# Patient Record
Sex: Female | Born: 1965 | Race: Black or African American | Hispanic: No | Marital: Single | Smoking: Former smoker
Health system: Southern US, Community
[De-identification: ages and names within clinical notes are randomized; demographics above are authoritative.]

## PROBLEM LIST (undated history)

## (undated) DIAGNOSIS — B999 Unspecified infectious disease: Secondary | ICD-10-CM

## (undated) DIAGNOSIS — L259 Unspecified contact dermatitis, unspecified cause: Secondary | ICD-10-CM

## (undated) DIAGNOSIS — J449 Chronic obstructive pulmonary disease, unspecified: Secondary | ICD-10-CM

## (undated) DIAGNOSIS — F603 Borderline personality disorder: Secondary | ICD-10-CM

## (undated) DIAGNOSIS — D219 Benign neoplasm of connective and other soft tissue, unspecified: Secondary | ICD-10-CM

## (undated) DIAGNOSIS — F419 Anxiety disorder, unspecified: Secondary | ICD-10-CM

## (undated) DIAGNOSIS — H8109 Meniere's disease, unspecified ear: Secondary | ICD-10-CM

## (undated) DIAGNOSIS — M5430 Sciatica, unspecified side: Secondary | ICD-10-CM

## (undated) HISTORY — DX: Sciatica, unspecified side: M54.30

## (undated) HISTORY — PX: HEMORRHOID BANDING: SHX5850

## (undated) HISTORY — DX: Anxiety disorder, unspecified: F41.9

## (undated) HISTORY — DX: Unspecified contact dermatitis, unspecified cause: L25.9

---

## 2002-02-24 ENCOUNTER — Other Ambulatory Visit: Admission: RE | Admit: 2002-02-24 | Discharge: 2002-02-24 | Payer: Self-pay

## 2002-04-28 ENCOUNTER — Emergency Department (HOSPITAL_COMMUNITY): Admission: EM | Admit: 2002-04-28 | Discharge: 2002-04-28 | Payer: Self-pay | Admitting: Emergency Medicine

## 2003-03-16 ENCOUNTER — Other Ambulatory Visit: Admission: RE | Admit: 2003-03-16 | Discharge: 2003-03-16 | Payer: Self-pay | Admitting: Obstetrics and Gynecology

## 2003-03-16 ENCOUNTER — Encounter: Admission: RE | Admit: 2003-03-16 | Discharge: 2003-03-16 | Payer: Self-pay | Admitting: Obstetrics and Gynecology

## 2003-03-17 ENCOUNTER — Ambulatory Visit (HOSPITAL_COMMUNITY): Admission: RE | Admit: 2003-03-17 | Discharge: 2003-03-17 | Payer: Self-pay | Admitting: Obstetrics and Gynecology

## 2003-04-14 ENCOUNTER — Emergency Department (HOSPITAL_COMMUNITY): Admission: AD | Admit: 2003-04-14 | Discharge: 2003-04-14 | Payer: Self-pay

## 2003-04-16 ENCOUNTER — Emergency Department (HOSPITAL_COMMUNITY): Admission: EM | Admit: 2003-04-16 | Discharge: 2003-04-16 | Payer: Self-pay | Admitting: Emergency Medicine

## 2003-06-01 ENCOUNTER — Encounter: Admission: RE | Admit: 2003-06-01 | Discharge: 2003-06-01 | Payer: Self-pay | Admitting: Internal Medicine

## 2003-10-04 ENCOUNTER — Encounter: Admission: RE | Admit: 2003-10-04 | Discharge: 2003-10-04 | Payer: Self-pay | Admitting: Family Medicine

## 2003-10-05 ENCOUNTER — Encounter: Admission: RE | Admit: 2003-10-05 | Discharge: 2003-10-05 | Payer: Self-pay | Admitting: Sports Medicine

## 2003-10-09 ENCOUNTER — Emergency Department (HOSPITAL_COMMUNITY): Admission: EM | Admit: 2003-10-09 | Discharge: 2003-10-09 | Payer: Self-pay

## 2003-10-28 ENCOUNTER — Encounter: Admission: RE | Admit: 2003-10-28 | Discharge: 2003-10-28 | Payer: Self-pay | Admitting: Family Medicine

## 2004-09-07 ENCOUNTER — Ambulatory Visit: Payer: Self-pay | Admitting: Obstetrics and Gynecology

## 2004-09-22 ENCOUNTER — Ambulatory Visit (HOSPITAL_COMMUNITY): Admission: RE | Admit: 2004-09-22 | Discharge: 2004-09-22 | Payer: Self-pay | Admitting: Family Medicine

## 2005-03-15 ENCOUNTER — Ambulatory Visit: Payer: Self-pay | Admitting: Family Medicine

## 2005-06-29 ENCOUNTER — Ambulatory Visit: Payer: Self-pay | Admitting: Family Medicine

## 2005-07-22 ENCOUNTER — Encounter (INDEPENDENT_AMBULATORY_CARE_PROVIDER_SITE_OTHER): Payer: Self-pay | Admitting: *Deleted

## 2005-07-22 LAB — CONVERTED CEMR LAB

## 2005-10-08 ENCOUNTER — Inpatient Hospital Stay (HOSPITAL_COMMUNITY): Admission: AD | Admit: 2005-10-08 | Discharge: 2005-10-08 | Payer: Self-pay | Admitting: Obstetrics and Gynecology

## 2005-10-22 HISTORY — PX: TOTAL ABDOMINAL HYSTERECTOMY: SHX209

## 2005-11-09 ENCOUNTER — Ambulatory Visit: Payer: Self-pay | Admitting: Family Medicine

## 2005-11-09 ENCOUNTER — Other Ambulatory Visit: Admission: RE | Admit: 2005-11-09 | Discharge: 2005-11-09 | Payer: Self-pay | Admitting: Family Medicine

## 2005-11-09 ENCOUNTER — Encounter (INDEPENDENT_AMBULATORY_CARE_PROVIDER_SITE_OTHER): Payer: Self-pay | Admitting: *Deleted

## 2005-11-09 ENCOUNTER — Encounter: Payer: Self-pay | Admitting: Family Medicine

## 2005-11-23 ENCOUNTER — Ambulatory Visit (HOSPITAL_COMMUNITY): Admission: RE | Admit: 2005-11-23 | Discharge: 2005-11-23 | Payer: Self-pay | Admitting: Family Medicine

## 2005-11-30 ENCOUNTER — Ambulatory Visit: Payer: Self-pay | Admitting: Family Medicine

## 2005-12-28 ENCOUNTER — Ambulatory Visit: Payer: Self-pay | Admitting: Obstetrics and Gynecology

## 2006-02-08 ENCOUNTER — Ambulatory Visit: Payer: Self-pay | Admitting: Gynecology

## 2006-04-03 ENCOUNTER — Ambulatory Visit: Payer: Self-pay | Admitting: Family Medicine

## 2006-04-03 ENCOUNTER — Inpatient Hospital Stay (HOSPITAL_COMMUNITY): Admission: RE | Admit: 2006-04-03 | Discharge: 2006-04-05 | Payer: Self-pay | Admitting: Family Medicine

## 2006-04-03 ENCOUNTER — Encounter (INDEPENDENT_AMBULATORY_CARE_PROVIDER_SITE_OTHER): Payer: Self-pay | Admitting: Specialist

## 2006-04-03 HISTORY — PX: OTHER SURGICAL HISTORY: SHX169

## 2006-04-18 ENCOUNTER — Ambulatory Visit: Payer: Self-pay | Admitting: Family Medicine

## 2006-04-27 ENCOUNTER — Ambulatory Visit: Payer: Self-pay | Admitting: Obstetrics & Gynecology

## 2006-04-27 ENCOUNTER — Inpatient Hospital Stay (HOSPITAL_COMMUNITY): Admission: AD | Admit: 2006-04-27 | Discharge: 2006-04-29 | Payer: Self-pay | Admitting: Obstetrics & Gynecology

## 2006-05-08 ENCOUNTER — Inpatient Hospital Stay (HOSPITAL_COMMUNITY): Admission: AD | Admit: 2006-05-08 | Discharge: 2006-05-08 | Payer: Self-pay | Admitting: Gynecology

## 2006-05-16 ENCOUNTER — Ambulatory Visit: Payer: Self-pay | Admitting: Family Medicine

## 2006-05-24 ENCOUNTER — Ambulatory Visit: Payer: Self-pay | Admitting: Gynecology

## 2006-11-21 ENCOUNTER — Encounter: Admission: RE | Admit: 2006-11-21 | Discharge: 2006-11-21 | Payer: Self-pay | Admitting: Sports Medicine

## 2006-11-21 ENCOUNTER — Ambulatory Visit: Payer: Self-pay | Admitting: Family Medicine

## 2006-12-17 ENCOUNTER — Ambulatory Visit: Payer: Self-pay | Admitting: Family Medicine

## 2006-12-20 ENCOUNTER — Encounter (INDEPENDENT_AMBULATORY_CARE_PROVIDER_SITE_OTHER): Payer: Self-pay | Admitting: *Deleted

## 2007-02-27 ENCOUNTER — Telehealth: Payer: Self-pay | Admitting: *Deleted

## 2007-02-28 ENCOUNTER — Ambulatory Visit: Payer: Self-pay | Admitting: Family Medicine

## 2007-03-27 ENCOUNTER — Encounter (INDEPENDENT_AMBULATORY_CARE_PROVIDER_SITE_OTHER): Payer: Self-pay | Admitting: *Deleted

## 2007-05-06 ENCOUNTER — Telehealth (INDEPENDENT_AMBULATORY_CARE_PROVIDER_SITE_OTHER): Payer: Self-pay | Admitting: *Deleted

## 2007-05-15 ENCOUNTER — Ambulatory Visit: Payer: Self-pay | Admitting: Family Medicine

## 2007-05-15 DIAGNOSIS — F1290 Cannabis use, unspecified, uncomplicated: Secondary | ICD-10-CM | POA: Insufficient documentation

## 2007-05-15 DIAGNOSIS — F1210 Cannabis abuse, uncomplicated: Secondary | ICD-10-CM | POA: Insufficient documentation

## 2007-05-15 DIAGNOSIS — J449 Chronic obstructive pulmonary disease, unspecified: Secondary | ICD-10-CM

## 2007-05-29 ENCOUNTER — Ambulatory Visit (HOSPITAL_COMMUNITY): Admission: RE | Admit: 2007-05-29 | Discharge: 2007-05-29 | Payer: Self-pay | Admitting: Sports Medicine

## 2007-06-04 ENCOUNTER — Telehealth (INDEPENDENT_AMBULATORY_CARE_PROVIDER_SITE_OTHER): Payer: Self-pay | Admitting: *Deleted

## 2007-06-11 ENCOUNTER — Telehealth (INDEPENDENT_AMBULATORY_CARE_PROVIDER_SITE_OTHER): Payer: Self-pay | Admitting: *Deleted

## 2007-06-20 ENCOUNTER — Ambulatory Visit: Payer: Self-pay | Admitting: Family Medicine

## 2007-06-27 ENCOUNTER — Ambulatory Visit (HOSPITAL_COMMUNITY): Admission: RE | Admit: 2007-06-27 | Discharge: 2007-06-27 | Payer: Self-pay | Admitting: Obstetrics & Gynecology

## 2007-06-29 IMAGING — CT CT PELVIS W/ CM
1 of 3 series · 14 of 32 positions shown, 19 images · IV contrast ([ID] GASTRO-MX & [ID] OMNIP 300%)
Comparison: none

CLINICAL DATA: 39-year-old with vaginal hysterectomy on [DATE].  Patient has abdominal pain.  Status post total vaginal hysterectomy with fever.  
ABDOMEN CT WITH CONTRAST:
TECHNIQUE: Multidetector CT imaging of the abdomen was performed following the standard protocol during bolus administration of intravenous contrast.
Contrast:  100 cc Omnipaque 300 and oral contrast.
TECHNIQUE: Multidetector CT imaging of the pelvis was performed following the standard protocol during bolus administration of intravenous contrast.

[Series 2: abd pelvis · axial · 0.61mm/px · z∈[-374,-54]mm · 14 of 74 slices shown, 19 images]
[im 5/74  soft-tissue]
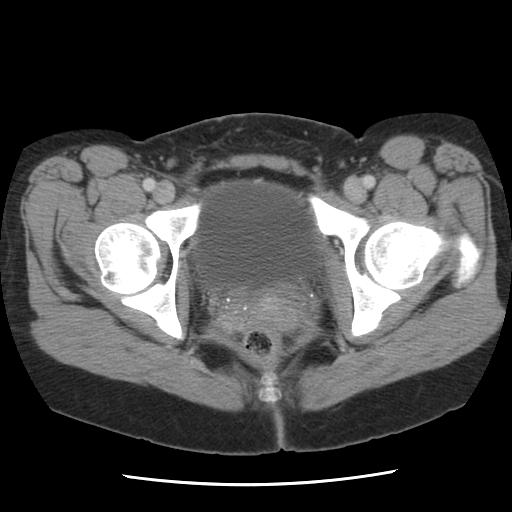
[im 5/74  bone]
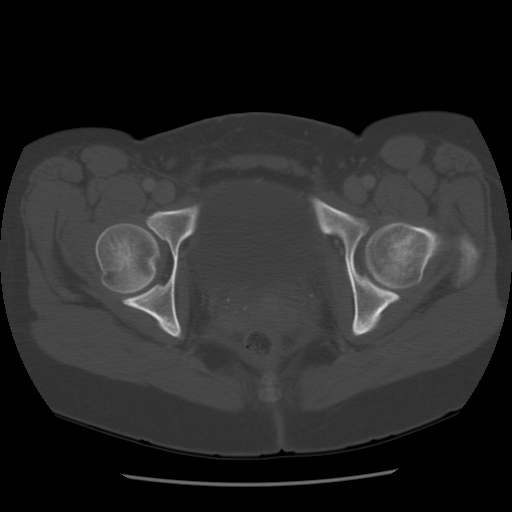
[im 10/74  soft-tissue]
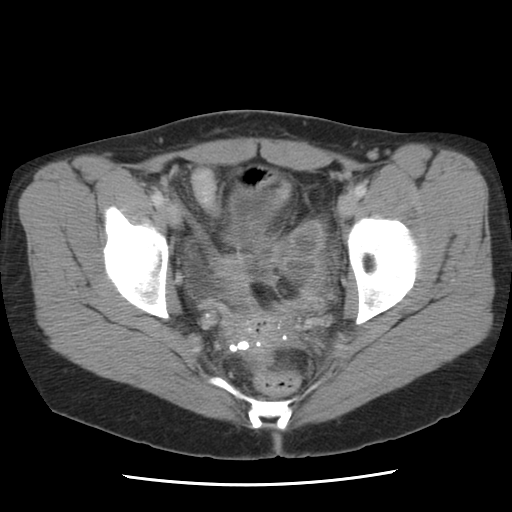
[im 14/74  soft-tissue]
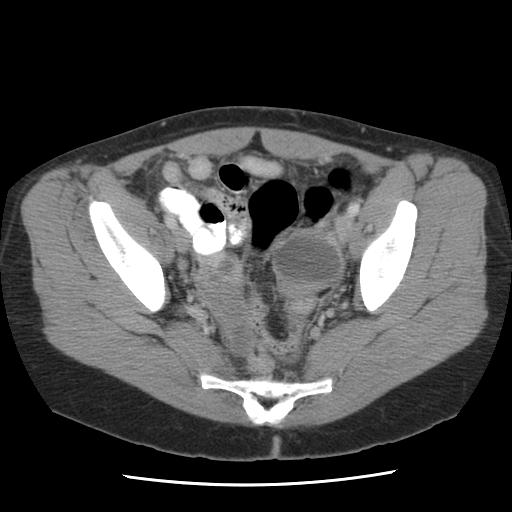
[im 23/74  soft-tissue]
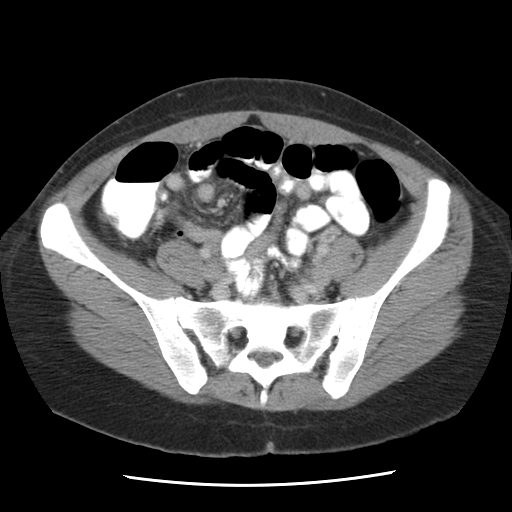
[im 28/74  soft-tissue]
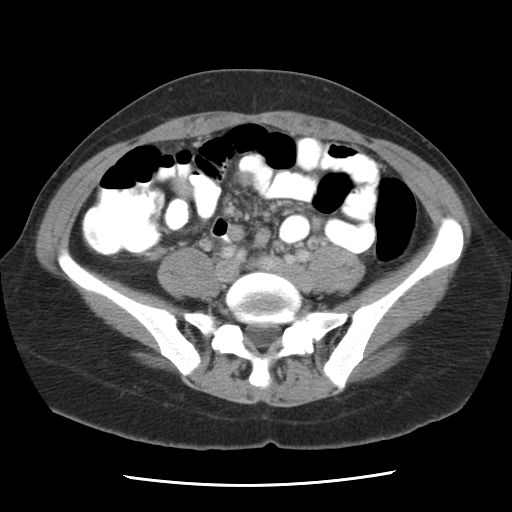
[im 32/74  soft-tissue]
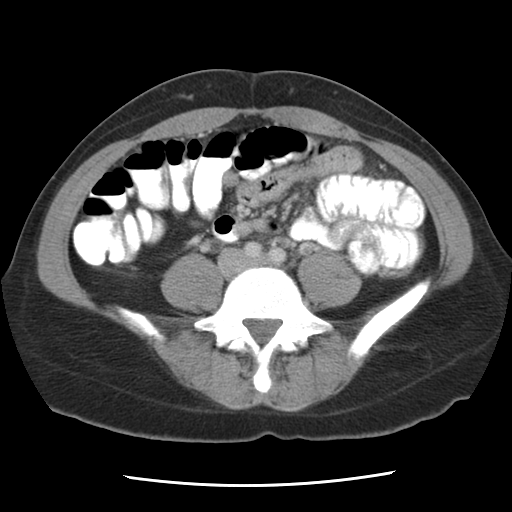
[im 37/74  soft-tissue]
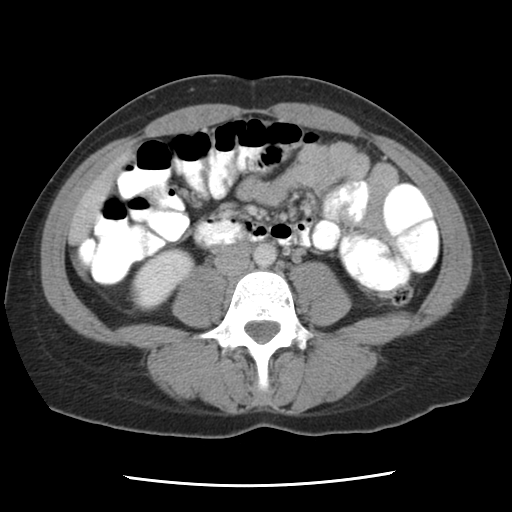
[im 42/74  soft-tissue]
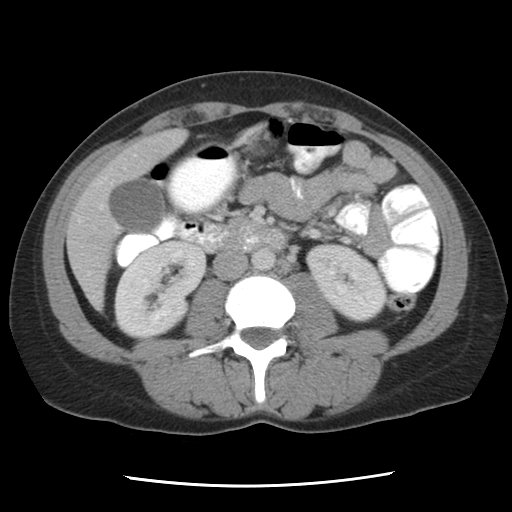
[im 46/74  soft-tissue]
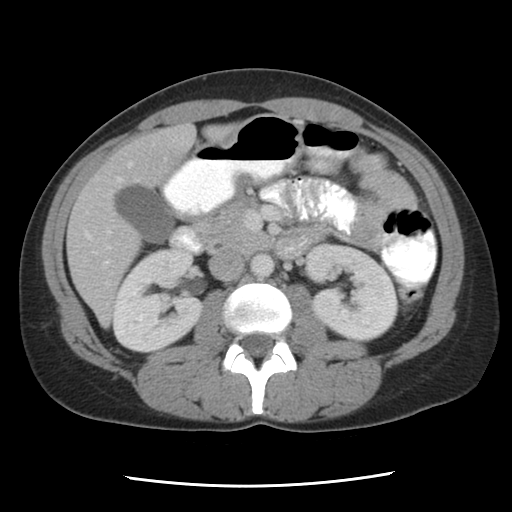
[im 46/74  bone]
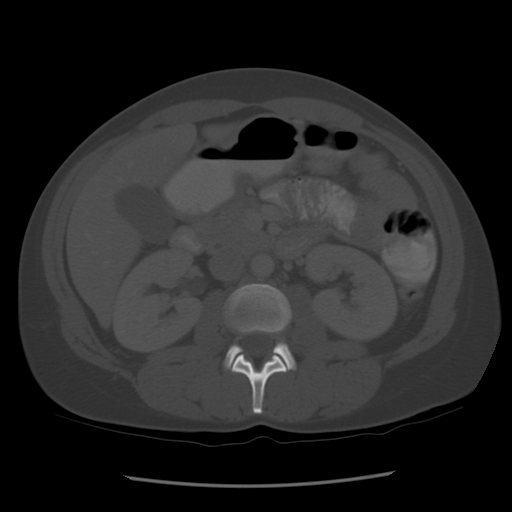
[im 51/74  soft-tissue]
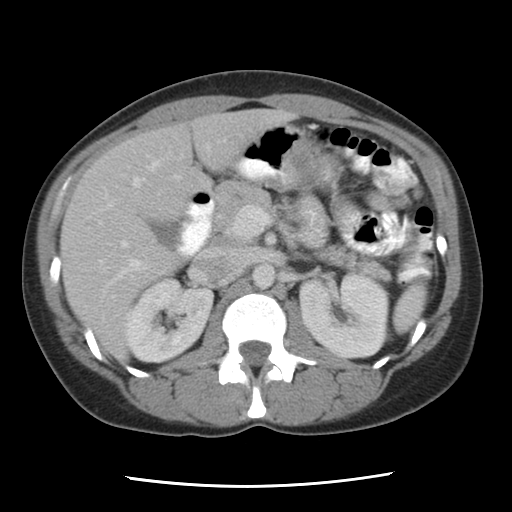
[im 55/74  lung]
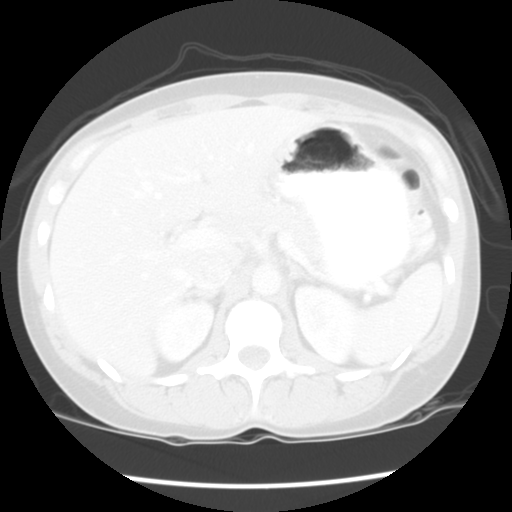
[im 60/74  soft-tissue]
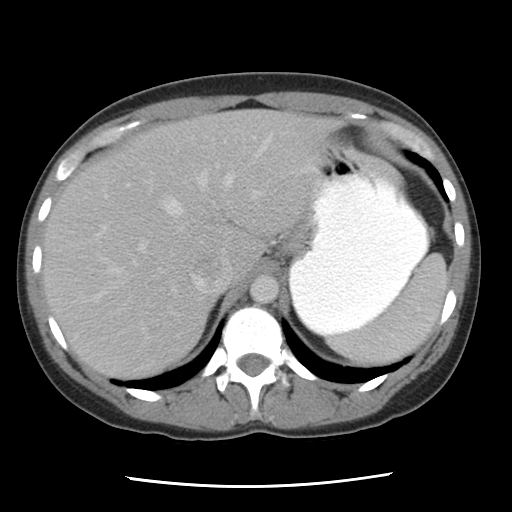
[im 60/74  lung]
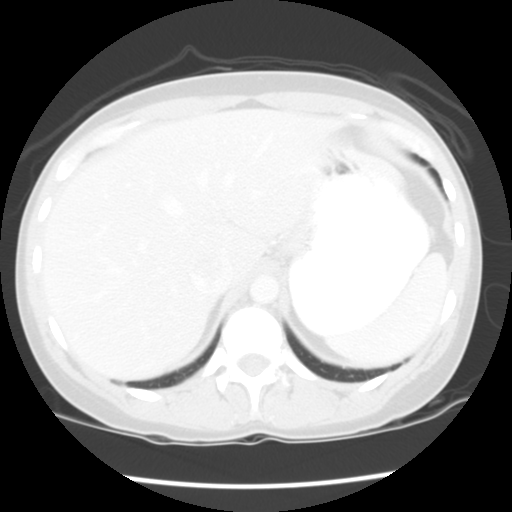
[im 64/74  soft-tissue]
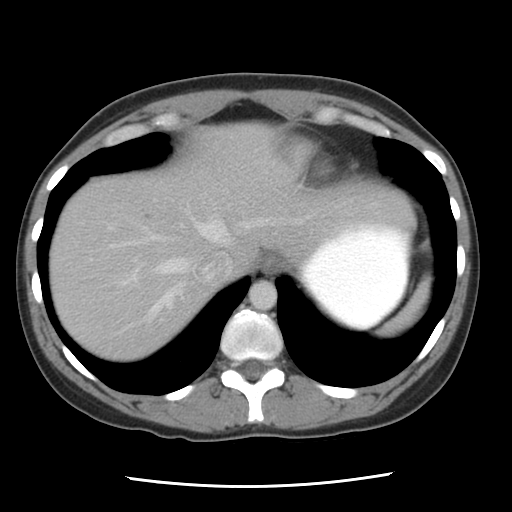
[im 64/74  lung]
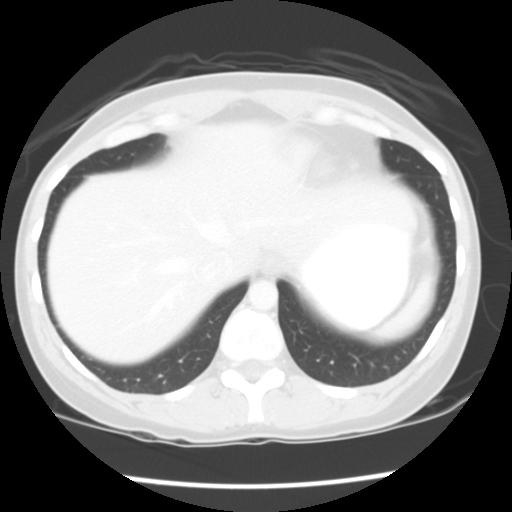
[im 69/74  soft-tissue]
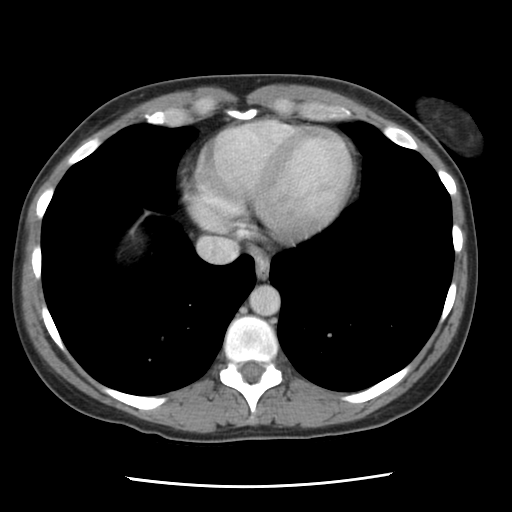
[im 69/74  lung]
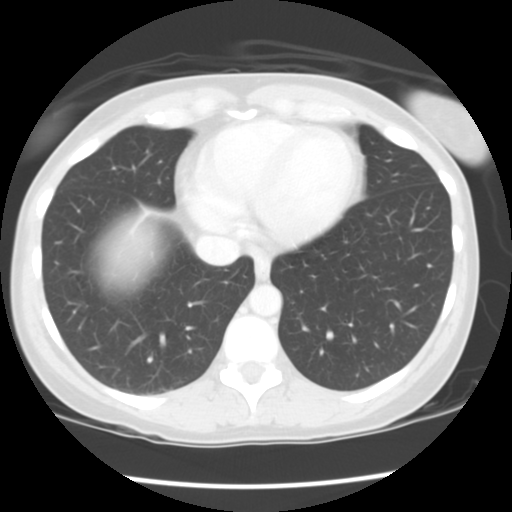

[14 of 32 positions shown; findings below may reference images not displayed]

FINDINGS: Images of the lung bases are unremarkable.  No focal abnormality is seen within the liver, spleen, pancreas, adrenal glands, or kidneys.  The gallbladder is present.  The abdominal bowel loops have a normal appearance.
IMPRESSION: No evidence for acute abnormality of the abdomen. 
PELVIS CT WITH CONTRAST:
FINDINGS: Patient has had hysterectomy.  There is a complex cystic mass within the left adnexa.  The largest of these measures 4.3 x 3.1 cm and the left ovary is 5.5 cm in craniocaudal length.  Smaller cysts inferiorly on the left are 2.4 and 2.2 cm.  A small cyst is seen in the right adnexa, likely representing right ovarian cyst.  This cyst is 18 mm in diameter.  There is a small amount of free pelvic fluid.  Note is made of left ovarian vein thrombus which is seen on both early and renal delayed images.  There is no free intraperitoneal air.
IMPRESSION: Bilateral complex cystic masses, left greater than right.  There is minimal fluid within the pelvis following hysterectomy.  Note is made of left ovarian vein thrombosis.  indings were called to Dr. Berduo.

## 2007-07-09 ENCOUNTER — Telehealth (INDEPENDENT_AMBULATORY_CARE_PROVIDER_SITE_OTHER): Payer: Self-pay | Admitting: *Deleted

## 2008-02-08 ENCOUNTER — Emergency Department (HOSPITAL_COMMUNITY): Admission: EM | Admit: 2008-02-08 | Discharge: 2008-02-08 | Payer: Self-pay | Admitting: Family Medicine

## 2008-07-02 ENCOUNTER — Ambulatory Visit (HOSPITAL_COMMUNITY): Admission: RE | Admit: 2008-07-02 | Discharge: 2008-07-02 | Payer: Self-pay | Admitting: Obstetrics and Gynecology

## 2008-07-26 ENCOUNTER — Ambulatory Visit: Payer: Self-pay | Admitting: Family Medicine

## 2008-07-26 ENCOUNTER — Encounter (INDEPENDENT_AMBULATORY_CARE_PROVIDER_SITE_OTHER): Payer: Self-pay | Admitting: Family Medicine

## 2008-07-27 LAB — CONVERTED CEMR LAB
INR: 1 (ref 0.0–1.5)
MCHC: 32.4 g/dL (ref 30.0–36.0)
MCV: 85.1 fL (ref 78.0–100.0)
Platelets: 231 10*3/uL (ref 150–400)
Prothrombin Time: 13.5 s (ref 11.6–15.2)
RBC: 4.97 M/uL (ref 3.87–5.11)
RDW: 15.2 % (ref 11.5–15.5)
aPTT: 29 s (ref 24–37)

## 2008-09-01 ENCOUNTER — Ambulatory Visit: Payer: Self-pay | Admitting: Family Medicine

## 2008-09-02 DIAGNOSIS — J309 Allergic rhinitis, unspecified: Secondary | ICD-10-CM | POA: Insufficient documentation

## 2009-06-17 ENCOUNTER — Encounter: Payer: Self-pay | Admitting: Family Medicine

## 2009-12-30 ENCOUNTER — Ambulatory Visit: Payer: Self-pay | Admitting: Family Medicine

## 2010-05-24 ENCOUNTER — Encounter: Payer: Self-pay | Admitting: Family Medicine

## 2010-05-24 ENCOUNTER — Ambulatory Visit: Payer: Self-pay | Admitting: Family Medicine

## 2010-05-24 LAB — CONVERTED CEMR LAB
ALT: 8 units/L (ref 0–35)
AST: 13 units/L (ref 0–37)
Albumin: 4.2 g/dL (ref 3.5–5.2)
Alkaline Phosphatase: 48 units/L (ref 39–117)
BUN: 13 mg/dL (ref 6–23)
CO2: 25 meq/L (ref 19–32)
Calcium: 9.2 mg/dL (ref 8.4–10.5)
Chloride: 105 meq/L (ref 96–112)
Cholesterol: 216 mg/dL — ABNORMAL HIGH (ref 0–200)
Creatinine, Ser: 0.76 mg/dL (ref 0.40–1.20)
Glucose, Bld: 86 mg/dL (ref 70–99)
HDL: 65 mg/dL (ref >39)
LDL Cholesterol: 129 mg/dL — ABNORMAL HIGH (ref 0–99)
Potassium: 4 meq/L (ref 3.5–5.3)
Sodium: 137 meq/L (ref 135–145)
Total Bilirubin: 0.5 mg/dL (ref 0.3–1.2)
Total CHOL/HDL Ratio: 3.3
Total Protein: 6.4 g/dL (ref 6.0–8.3)
Triglycerides: 109 mg/dL (ref <150)
VLDL: 22 mg/dL (ref 0–40)

## 2010-05-25 ENCOUNTER — Encounter: Payer: Self-pay | Admitting: Family Medicine

## 2010-05-31 ENCOUNTER — Ambulatory Visit (HOSPITAL_COMMUNITY): Admission: RE | Admit: 2010-05-31 | Discharge: 2010-05-31 | Payer: Self-pay | Admitting: Family Medicine

## 2010-06-04 ENCOUNTER — Emergency Department (HOSPITAL_COMMUNITY): Admission: EM | Admit: 2010-06-04 | Discharge: 2010-06-04 | Payer: Self-pay | Admitting: Family Medicine

## 2010-06-07 ENCOUNTER — Ambulatory Visit: Payer: Self-pay | Admitting: Family Medicine

## 2010-06-07 DIAGNOSIS — F410 Panic disorder [episodic paroxysmal anxiety] without agoraphobia: Secondary | ICD-10-CM

## 2010-07-07 ENCOUNTER — Ambulatory Visit: Payer: Self-pay | Admitting: Family Medicine

## 2010-08-23 ENCOUNTER — Ambulatory Visit: Payer: Self-pay | Admitting: Family Medicine

## 2010-09-18 ENCOUNTER — Encounter: Payer: Self-pay | Admitting: *Deleted

## 2010-09-19 ENCOUNTER — Ambulatory Visit: Payer: Self-pay | Admitting: Family Medicine

## 2010-11-09 ENCOUNTER — Encounter: Payer: Self-pay | Admitting: Family Medicine

## 2010-11-09 ENCOUNTER — Ambulatory Visit
Admission: RE | Admit: 2010-11-09 | Discharge: 2010-11-09 | Payer: Self-pay | Source: Home / Self Care | Attending: Family Medicine | Admitting: Family Medicine

## 2010-11-21 NOTE — Miscellaneous (Signed)
Summary: triage   Clinical Lists Changes patient walks in office today stating recently she has started having anxiety attacks again. she does have 4 xanax left from  last refill.  states this medication does help her . at last visit with Dr. Earlene Plater she had told MD she was doing well and did not need the xanax. consulted with Dr. Sheffield Slider and appointmnt scheduled with Dr. Earlene Plater for tomorrow. Theresia Lo RN  September 18, 2010 11:46 AM

## 2010-11-21 NOTE — Assessment & Plan Note (Signed)
Summary: needs inhaler per pt/eo   Vital Signs:  Patient profile:   45 year old female Weight:      156.5 pounds Temp:     98.4 degrees F oral Pulse rate:   80 / minute BP sitting:   112 / 73  (left arm) Cuff size:   regular  Vitals Entered By: Gladstone Pih (December 30, 2009 4:32 PM) CC: Needs refill on inhalers Is Patient Diabetic? No Pain Assessment Patient in pain? no        Primary Care Provider:  Helane Rima MD  CC:  Needs refill on inhalers.  History of Present Illness: Kristina Moreno is a 45 year old AAF:  1. COPD: She is requesting a refill of her Albuterol and a change from Flovent to "the other inhaler" in order for it to be covered by MAP. She uses her Albuterol once every few months. She smoked 1/2 ppd x 30 years, but quit several years ago. She does admit to heavy marijuana use ( ~ 6 joints daily).   4. Health Maintenance: She has had a partial hysterectomy (re: fibroids) and does not require paps any longer, she has no documented CMP, Lipids, TSH. She has gotten yearly mammograms since the age of 56 at the [facility]. No family history of colon cancer. She does have a family history of osteoporosis.  She denies CP, SOB, cough, N/V/D/C, lower extremity swelling, headaches, dizziness, abdominal pain.  Habits & Providers  Alcohol-Tobacco-Diet     Tobacco Status: quit     Tobacco Counseling: to quit use of tobacco products     Year Quit: 2008  Current Medications (verified): 1)  Pulmicort Flexhaler 180 Mcg/act Aepb (Budesonide) .... 2 Puffs Inhaled Bid 2)  Ventolin Hfa 108 (90 Base) Mcg/act Aers (Albuterol Sulfate) .... 2 Puffs Every 4 Hours As Needed For Shortness of Breath or Wheexing  Allergies (verified): No Known Drug Allergies PMH-FH-SH reviewed for relevance  Review of Systems       She denies CP, SOB, cough, N/V/D/C, lower extremity swelling, headaches, dizziness, abdominal pain.  Physical Exam  General:  Alert, well-developed, well-nourished, and  well-hydrated.  Vitals reviewed. Lungs:  Normal respiratory effort, chest expands symmetrically. Decreased breath sounds bilaterally, but lungs are clear to auscultation, no crackles or wheezes. Heart:  Distant heart sounds. Normal rate and regular rhythm. S1 and S2 normal without gallop, murmur, click, rub or other extra sounds. Extremities:  No clubbing, cyanosis, or edema.    Impression & Recommendations:  Problem # 1:  COPD (ICD-496) Assessment Unchanged  Refilled inhalers. Will not change regimen at this time. Her updated medication list for this problem includes:    Pulmicort Flexhaler 180 Mcg/act Aepb (Budesonide) .Marland Kitchen... 2 puffs inhaled bid    Ventolin Hfa 108 (90 Base) Mcg/act Aers (Albuterol sulfate) .Marland Kitchen... 2 puffs every 4 hours as needed for shortness of breath or wheexing  Orders: [facility]- Est Level  3 (98119)  Problem # 2:  MARIJUANA ABUSE (ICD-305.20) Assessment: Unchanged  Discussed risks of smoking and benefits of cessation. Patient not ready to quit at this time.  Orders: [facility]- Est Level  3 (99213)  Complete Medication List: 1)  Pulmicort Flexhaler 180 Mcg/act Aepb (Budesonide) .... 2 puffs inhaled bid 2)  Ventolin Hfa 108 (90 Base) Mcg/act Aers (Albuterol sulfate) .... 2 puffs every 4 hours as needed for shortness of breath or wheexing  Other Orders: Future Orders: Comp Met-[facility] ([id number]) ... 12/29/2010 Lipid-[facility] ([id number]) ... 12/29/2010  Patient Instructions: 1)  It was  nice to see you today! 2)  Make sure to get [facility]. 3)  Follow up or a FULL PHYSICAL with PAP in a few months. We will get labs at that time. Prescriptions: VENTOLIN HFA 108 (90 BASE) MCG/ACT AERS (ALBUTEROL SULFATE) 2 puffs every 4 hours as needed for shortness of breath or wheexing  #1 x 3   Entered and Authorized by:   Helane Rima DO   Signed by:   Helane Rima DO on 12/30/2009   Method used:   Print then Give to Patient   RxID:   [id number] VENTOLIN HFA 108 (90 BASE)  MCG/ACT AERS (ALBUTEROL SULFATE) 2 puffs every 4 hours as needed for shortness of breath or wheexing  #1 x 3   Entered and Authorized by:   Helane Rima DO   Signed by:   Helane Rima DO on 12/30/2009   Method used:   Print then Give to Patient   RxID:   [id number] PULMICORT FLEXHALER 180 MCG/ACT AEPB (BUDESONIDE) 2 puffs inhaled BID  #3 x 4   Entered and Authorized by:   Helane Rima DO   Signed by:   Helane Rima DO on 12/30/2009   Method used:   Print then Give to Patient   RxID:   [id number]   Prevention & Chronic Care Immunizations   Influenza vaccine: Fluvax 3+  (07/26/2008)   Influenza vaccine deferral: Refused  (12/30/2009)   Influenza vaccine due: 07/26/2009    Tetanus booster: 03/22/2005: Done.   Tetanus booster due: 03/23/2015    Pneumococcal vaccine: Not documented  Other Screening   Pap smear: Done.  (07/22/2005)   Pap smear action/deferral: Not indicated S/P hysterectomy  (12/30/2009)   Pap smear due: 07/22/2006    Mammogram: normal  (07/02/2008)   Mammogram action/deferral: Ordered  (12/30/2009)   Mammogram due: 07/02/2009   Smoking status: quit  (12/30/2009)  Lipids   Total Cholesterol: Not documented   LDL: Not documented   LDL Direct: Not documented   HDL: Not documented   Triglycerides: Not documented

## 2010-11-21 NOTE — Miscellaneous (Signed)
Summary: WALK IN  Clinical Lists Changes walked in c/o L thumb pain & swelling as of yesterday. has iced it. denies injury. it was hurting slightly last night & very badly this am.  no appt left here. sent to urgent care. related strong family hx of osteo & rhumatoid arthritis. 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 agreed to go to [facility].Golden Circle RN  June 17, 2009 1:38 PM

## 2010-11-21 NOTE — Assessment & Plan Note (Signed)
Summary: FU/KH   Vital Signs:  Patient Profile:   45 Years Old Female Weight:      151.7 pounds Temp:     98.3 degrees F oral Pulse rate:   71 / minute BP sitting:   115 / 74  (left arm) Cuff size:   regular  Pt. in pain?   no  Vitals Entered By: Garen Grams LPN (September 01, 2008 3:40 PM)                 Last Mammogram:  Done. (02/20/2003 12:00:00 AM) Mammogram Result Date:  07/02/2008 Mammogram Result:  normal Mammogram Next Due:  1 yr The patient has had a total vaginal hysterectomy.   PCP:  Helane Rima MD  Chief Complaint:  f/u visit leg pain.  History of Present Illness: [name] is a 45 year old woman here for follow up of lab work and to meet me.  1. She was last seen by Dr. Deirdre Peer for vague leg pain and bruising. At that time, Dr. Deirdre Peer concluded that the symptoms were likely secondary to arthritis vs. restless leg syndrome with post-inflammatory hyperpigmentation more likely than bruising. The patient's father died of leukemia and demanded a further workup. Labs included CBC, PT, PTT which were all normal. Dr. Deirdre Peer also prescribed Requip for RLS and Ibuprofen for arthritic pain. [name] was reassured by her normal results and stated that she took the Requip for one week until her leg pain ceased. She says that this problem has now resolved.   2. COPD: She is requesting a refill of her albuterol. She uses it once every few months. She aslo has a Rx for Flovent. She smoked 1/2 ppd x 30 years, but quit several years ago. She does admit to heavy marijuana use (>1 daily).   3. Allergic Rhinitis: Stable, Flonase.  4. Health Maintenance: She has had a partial hysterectomy (re: fibroids) and does not require paps any longer, she has no documented CMP, Lipids, TSH. She has gotten yearly mammograms since the age of 50 at the [facility]. No family history of colon cancer. She does have a family history of osteoporosis and has questions about calcium with vitamin D.  She denies CP,  SOB, cough, N/V/D/C, lower extremity swelling, headaches, dizziness, abdominal pain.    Prior Medication List:  FLONASE 50 MCG/ACT  SUSP (FLUTICASONE PROPIONATE) 2 puffs in each nostril daily. FLOVENT HFA 44 MCG/ACT  AERO (FLUTICASONE PROPIONATE  HFA) One puff BID ALBUTEROL SULFATE 2 MG TABS (ALBUTEROL SULFATE) 2 puffs q 4 hrs prn REQUIP 1 MG TABS (ROPINIROLE HCL) 1/2 tablet by mouth at bedtime for 1 week, then 1 tablet by mouth at bedtime   Current Allergies: No known allergies   Past Medical History:    Allergic rhinitis    COPD  Past Surgical History:    BTL    TVH   Family History:    Reviewed history from 12/19/2006 and no changes required:       Father 84 with blood cancer, ? Leukemia s/p splenectomy, Mother 22 with HTN, Two brothers, 32 and 25, healthy., Two sisters, 60 with hyperlipidemia, and 22-healthy.  Social History:    Divorced, lives with 2 kids, Ethelene Browns (college) and [place].  Works part time at [employer].  Likes to spend time with family, watch movies.  Smokes 1/2 ppd x 30 years but quit.  Etoh one beer per night and more on weekends.  Heavy marijuana smoker.  Distant history of crack use (>15) .  No exercise.   Risk Factors:     Comments:  Heavy Alcohol use:  yes    Drinks per day:  1    Counseled to quit/cut down alcohol use:  yes  Mammogram History:     Date of Last Mammogram:  07/02/2008   Mammogram History:     Date of Last Mammogram:  07/02/2008    Results:  normal   PAP Smear History:     Date of Last PAP Smear:  07/22/2005   Mammogram History:     Date of Last Mammogram:  02/20/2003    Review of Systems  The patient denies fever, weight loss, weight gain, chest pain, syncope, dyspnea on exertion, peripheral edema, prolonged cough, headaches, and abdominal pain.     Physical Exam  General:     alert, well-developed, well-nourished, and well-hydrated.   Neck:     No deformities, masses, or tenderness noted. Lungs:     Normal  respiratory effort, chest expands symmetrically. Decreased breath sounds bilaterally, but lungs are clear to auscultation, no crackles or wheezes. Heart:     Distant heart sounds. Normal rate and regular rhythm. S1 and S2 normal without gallop, murmur, click, rub or other extra sounds.    Impression & Recommendations:  Problem # 1:  LEG PAIN, BILATERAL (ICD-729.5) Assessment: Improved Resolved. Orders: [facility]- Est  Level 4 (29937)   Problem # 2:  COPD (ICD-496) Assessment: Unchanged Will request results of lat PFTs. Will refill albuterol today.   Her updated medication list for this problem includes:    Flovent Hfa 44 Mcg/act Aero (Fluticasone propionate  hfa) ..... One puff bid    Albuterol Sulfate 2 Mg Tabs (Albuterol sulfate) .Marland Kitchen... 2 puffs q 4 hrs prn  Orders: [facility]- Est  Level 4 (16967)   Problem # 3:  ALLERGIC RHINITIS (ICD-477.9) Assessment: Improved Stable. Continue current regimen.  Her updated medication list for this problem includes:    Flonase 50 Mcg/act Susp (Fluticasone propionate) .Marland Kitchen... 2 puffs in each nostril daily.   Problem # 4:  MARIJUANA ABUSE (ICD-305.20) Assessment: Unchanged Discussed risks of smoking and benefits of cessation.  Orders: [facility]- Est  Level 4 (89381)   Problem # 5:  Preventive Health Care (ICD-V70.0) I have asked Ms. Gore to schedule an appointment for annual physical. She will get labs (CMP, lipids, TSH) prior to coming in.  Complete Medication List: 1)  Flonase 50 Mcg/act Susp (Fluticasone propionate) .... 2 puffs in each nostril daily. 2)  Flovent Hfa 44 Mcg/act Aero (Fluticasone propionate  hfa) .... One puff bid 3)  Albuterol Sulfate 2 Mg Tabs (Albuterol sulfate) .... 2 puffs q 4 hrs prn 4)  Requip 1 Mg Tabs (Ropinirole hcl) .... 1/2 tablet by mouth at bedtime for 1 week, then 1 tablet by mouth at bedtime  Other Orders: Future Orders: Lipid-[facility] ([id number]) ... 08/25/2009 Comp Met-[facility] ([id number]) ... 08/31/2009 TSH-[facility]  ([id number]) ... 09/05/2009   Patient Instructions: 1)  Please schedule a follow-up appointment in 3 months for a physical. 2)  You may take Over the Counter Multi-Vitamins. 3)  Take 1200 mg of calcium + 800 IU of Vitamin D daily.  4)  BMP prior to visit, ICD-9: 5)  Lipid Panel prior to visit, ICD-9: 6)  TSH prior to visit, ICD-9:   Prescriptions: ALBUTEROL SULFATE 2 MG TABS (ALBUTEROL SULFATE) 2 puffs q 4 hrs prn  #1 x 11   Entered and Authorized by:   Helane Rima MD   Signed by:  Helane Rima MD on 09/01/2008   Method used:   Electronically to        [facility] [id number] (retail)       [address] [place], [place]  [address]       Ph: [phone]       Fax: [phone]   RxID:   [id number] FLOVENT HFA 44 MCG/ACT  AERO (FLUTICASONE PROPIONATE  HFA) One puff BID  #1 x 3   Entered and Authorized by:   Helane Rima MD   Signed by:   Helane Rima MD on 09/01/2008   Method used:   Electronically to        [facility] [id number] (retail)       [address] [place], [place]  [address]       Ph: [phone]       Fax: [phone]   RxID:   [id number] FLONASE 50 MCG/ACT  SUSP (FLUTICASONE PROPIONATE) 2 puffs in each nostril daily.  #1 x 3   Entered and Authorized by:   Helane Rima MD   Signed by:   Helane Rima MD on 09/01/2008   Method used:   Electronically to        [facility] [id number] (retail)       [address] [place], [place]  [address]       Ph: [phone]       Fax: [phone]   RxID:   [id number]  ]

## 2010-11-21 NOTE — Assessment & Plan Note (Signed)
Summary: f/u eo   Vital Signs:  Patient profile:   45 year old female Height:      65 inches Weight:      151.8 pounds Pulse rate:   72 / minute BP sitting:   130 / 70  (right arm)  Vitals Entered By: Arlyss Repress CMA, (July 07, 2010 2:53 PM) CC: f/up anxiety. feels better after broke up with boyfriend. does not need any meds for anxiety anymore. feels alright. Is Patient Diabetic? No Pain Assessment Patient in pain? no        Primary Care Provider:  Helane Rima MD  CC:  f/up anxiety. feels better after broke up with boyfriend. does not need any meds for anxiety anymore. feels alright..  History of Present Illness: 45 yo F:  1. Anxiety: RESOLVED. Patient states that she broke up with her boyfriend and now "I'm straight." She is still smoking marijuana, but less. She used about 7 of the Xanax and offered to give back the rest. She has no other concerns or complaints.  Habits & Providers  Alcohol-Tobacco-Diet     Tobacco Status: quit > 6 months     Tobacco Counseling: to remain off tobacco products  Comments: smokes pot  Current Medications (verified): 1)  Qvar 80 Mcg/act Aers (Beclomethasone Dipropionate) .... One Puff Bid 2)  Ventolin Hfa 108 (90 Base) Mcg/act Aers (Albuterol Sulfate) .... 2 Puffs Every 4 Hours As Needed For Shortness of Breath or Wheexing  Allergies (verified): No Known Drug Allergies PMH-FH-SH reviewed for relevance  Social History: Smoking Status:  quit > 6 months  Review of Systems General:  Denies chills, fatigue, fever, and malaise. Psych:  Denies anxiety and depression.  Physical Exam  General:  Alert, well-developed, well-nourished, and well-hydrated.  Vitals reviewed. Psych:  Oriented X3, memory intact for recent and remote, normally interactive, good eye contact, not anxious appearing, and not depressed appearing.     Impression & Recommendations:  Problem # 1:  PANIC ATTACK (ICD-300.01) Assessment  Improved  RESOLVED. The following medications were removed from the medication list:    Alprazolam 0.5 Mg Tabs (Alprazolam) .Marland Kitchen... 1/2 to 1 by mouth up to three times a day as needed for panic attacks  Orders: [facility]- Est Level  3 (91478)  Problem # 2:  MARIJUANA ABUSE (ICD-305.20) Assessment: Unchanged  Recommended cessation for pulmonary reasons.  Orders: [facility]- Est Level  3 (29562)  Complete Medication List: 1)  Qvar 80 Mcg/act Aers (Beclomethasone dipropionate) .... One puff bid 2)  Ventolin Hfa 108 (90 Base) Mcg/act Aers (Albuterol sulfate) .... 2 puffs every 4 hours as needed for shortness of breath or wheexing  Patient Instructions: 1)  It was great to see you today! 2)  Have a great time on your date tonight!  Prevention & Chronic Care Immunizations   Influenza vaccine: Fluvax 3+  (07/26/2008)   Influenza vaccine deferral: Refused  (12/30/2009)   Influenza vaccine due: 07/26/2009    Tetanus booster: 03/22/2005: Done.   Tetanus booster due: 03/23/2015    Pneumococcal vaccine: Not documented  Other Screening   Pap smear: Done.  (07/22/2005)   Pap smear action/deferral: Not indicated S/P hysterectomy  (12/30/2009)   Pap smear due: 07/22/2006    Mammogram: ASSESSMENT: Negative - BI-RADS 1^MM DIGITAL SCREENING  (05/31/2010)   Mammogram action/deferral: Ordered  (12/30/2009)   Mammogram due: 07/02/2009   Smoking status: quit > 6 months  (07/07/2010)  Lipids   Total Cholesterol: 216  (05/24/2010)   LDL: 129  (  05/24/2010)   LDL Direct: Not documented   HDL: 65  (05/24/2010)   Triglycerides: 109  (05/24/2010)

## 2010-11-21 NOTE — Assessment & Plan Note (Signed)
Summary: anxiety/ls   Vital Signs:  Patient profile:   45 year old female Height:      65 inches Weight:      155 pounds BMI:     25.89 Temp:     99.0 degrees F oral Pulse rate:   77 / minute BP sitting:   111 / 70  (left arm) Cuff size:   regular  Vitals Entered By: Tessie Fass CMA (September 19, 2010 3:20 PM) CC: anxiety Is Patient Diabetic? No Pain Assessment Patient in pain? no        Primary Care Provider:  Helane Rima MD  CC:  anxiety.  History of Present Illness: 45 year old F:  1. Anxiety: patient went to [facility] on 06/04/10 with SOB. she was evaluated and Dx with anxiety. Tx with Xanax 0.5 mg to take 0.5 to 1 mg by mouth three times a day as needed for anxiety. She came back a few weeks later and said that she felt great after dumping her boyfriend and offered to give the rest of te medication back. I told her to hold on to the pills. Today (several months later), she endorses a few episodes of impending panic attacks that were avoided with 1/2 pill. She is averaging 1/2 pill daily. Stressors include money and "NEW friend" issues. She is still not smoking marijuana.  Habits & Providers  Alcohol-Tobacco-Diet     Tobacco Status: quit  Current Medications (verified): 1)  Qvar 80 Mcg/act Aers (Beclomethasone Dipropionate) .... One Puff Bid 2)  Ventolin Hfa 108 (90 Base) Mcg/act Aers (Albuterol Sulfate) .... 2 Puffs Every 4 Hours As Needed For Shortness of Breath or Wheexing 3)  Alprazolam 0.5 Mg  Tabs (Alprazolam) .... 1/2 To 1 Tab Up To Three Times A Day As Needed Anxiety Attack  Allergies (verified): No Known Drug Allergies PMH-FH-SH reviewed for relevance  Social History: Smoking Status:  quit  Review of Systems General:  Denies chills and fever. CV:  Denies chest pain or discomfort, palpitations, shortness of breath with exertion, and swelling of feet. Resp:  Denies cough. GI:  Denies constipation, diarrhea, nausea, and vomiting. Psych:  Complains of  panic attacks; denies depression, suicidal thoughts/plans, and thoughts /plans of harming others.  Physical Exam  General:  Alert, well-developed, well-nourished, and well-hydrated.  Vitals reviewed. Psych:  Oriented X3, memory intact for recent and remote, normally interactive, good eye contact, not anxious appearing, and not depressed appearing.     Impression & Recommendations:  Problem # 1:  PANIC ATTACK (ICD-300.01) Assessment Deteriorated  Patient rarely uses Xanax. She is also practicing calming techniques. Okay for refill. Note: Only 1 Rx given, though 2 printed. Her updated medication list for this problem includes:    Alprazolam 0.5 Mg Tabs (Alprazolam) .Marland Kitchen... 1/2 to 1 tab up to three times a day as needed anxiety attack  Orders: [facility]- Est Level  3 ([address])  Complete Medication List: 1)  Qvar 80 Mcg/act Aers (Beclomethasone dipropionate) .... One puff bid 2)  Ventolin Hfa 108 (90 Base) Mcg/act Aers (Albuterol sulfate) .... 2 puffs every 4 hours as needed for shortness of breath or wheexing 3)  Alprazolam 0.5 Mg Tabs (Alprazolam) .... 1/2 to 1 tab up to three times a day as needed anxiety attack  Patient Instructions: 1)  It was great to see you today! Prescriptions: ALPRAZOLAM 0.5 MG  TABS (ALPRAZOLAM) 1/2 to 1 tab up to three times a day as needed anxiety attack  #90 x 0   Entered  and Authorized by:   Helane Rima DO   Signed by:   Helane Rima DO on 09/19/2010   Method used:   Print then Give to Patient   RxID:   [id number] ALPRAZOLAM 0.5 MG  TABS (ALPRAZOLAM) 1/2 to 1 tab up to three times a day as needed anxiety attack  #90 x 0   Entered and Authorized by:   Helane Rima DO   Signed by:   Helane Rima DO on 09/19/2010   Method used:   Print then Give to Patient   RxID:   [id number]    Orders Added: 1)  [facility]- Est Level  3 [[address]]   NOTE: ONLY ONE RX PRINTED AND GIVEN TO PATIENT. Helane Rima DO  September 19, 2010 3:36 PM

## 2010-11-21 NOTE — Assessment & Plan Note (Signed)
Summary: cpe,df   Vital Signs:  Patient profile:   45 year old female Height:      65 inches Weight:      151 pounds BMI:     25.22 Temp:     98.3 degrees F oral Pulse rate:   67 / minute BP sitting:   110 / 72  (right arm)  Vitals Entered By: Tessie Fass CMA (May 24, 2010 8:34 AM) CC: CPE Is Patient Diabetic? No Pain Assessment Patient in pain? yes     Location: right hand   Primary Care Provider:  Helane Rima MD  CC:  CPE.  History of Present Illness: Kristina Moreno is a 45 year old AAF:  1. COPD: She is requesting a refill of her Albuterol and Flovent. She uses her Albuterol once every few months. She smoked 1/2 ppd x 30 years, but quit several years ago. She does admit to heavy marijuana use ( ~ 6 joints daily).   4. Health Maintenance: She has had a partial hysterectomy (re: fibroids) and does not require paps any longer, she has no documented CMP, Lipids. She has gotten yearly mammograms since the age of 64 at the [facility]. No family history of colon cancer. She does have a family history of osteoporosis.  She denies CP, SOB, cough, N/V/D/C, lower extremity swelling, headaches, dizziness, abdominal pain.  Habits & Providers  Alcohol-Tobacco-Diet     Tobacco Status: quit  Current Medications (verified): 1)  Pulmicort Flexhaler 180 Mcg/act Aepb (Budesonide) .... 2 Puffs Inhaled Bid 2)  Ventolin Hfa 108 (90 Base) Mcg/act Aers (Albuterol Sulfate) .... 2 Puffs Every 4 Hours As Needed For Shortness of Breath or Wheexing  Allergies (verified): No Known Drug Allergies PMH-FH-SH reviewed for relevance  Review of Systems General:  Denies chills and fever. CV:  Denies chest pain or discomfort, shortness of breath with exertion, and swelling of feet. Resp:  Denies cough and wheezing. GI:  Denies change in bowel habits. GU:  Denies abnormal vaginal bleeding, discharge, dysuria, and genital sores. Derm:  Denies rash.  Physical Exam  General:  Alert, well-developed,  well-nourished, and well-hydrated.  Vitals reviewed. Head:  Normocephalic and atraumatic without obvious abnormalities.  Eyes:  No corneal or conjunctival inflammation noted. EOMI. Perrla. Vision grossly normal. Ears:  R ear normal and L ear normal.   Nose:  External nasal examination shows no deformity or inflammation. Nasal mucosa are pink and moist without lesions or exudates. Mouth:  Oral mucosa and oropharynx without lesions or exudates.  Poor dentition. Neck:  No deformities, masses, or tenderness noted. Lungs:  Normal respiratory effort, chest expands symmetrically. Decreased breath sounds bilaterally, but lungs are clear to auscultation, no crackles or wheezes. Heart:  Distant heart sounds. Normal rate and regular rhythm. S1 and S2 normal without gallop, murmur, click, rub or other extra sounds. Abdomen:  Bowel sounds positive,abdomen soft and non-tender without masses, organomegaly or hernias noted. Genitalia:  Normal introitus, no external lesions, no vaginal discharge, no friaility or hemorrhage, normal uterus size and position, and no adnexal masses or tenderness.   Pulses:  2 + DP. Extremities:  No clubbing, cyanosis, or edema.  Neurologic:  Alert & oriented X3, cranial nerves II-XII intact, strength normal in all extremities, sensation intact to light touch, gait normal, and DTRs symmetrical and normal.   Skin:  Intact without suspicious lesions or rashes. Psych:  Oriented X3, memory intact for recent and remote, normally interactive, good eye contact, not anxious appearing, and not depressed appearing.  Impression & Recommendations:  Problem # 1:  Preventive Health Care (ICD-V70.0) Assessment Unchanged No concerns today. CMP, FLP today.  Problem # 2:  COPD (ICD-496) Assessment: Unchanged  Refilled medications. Her updated medication list for this problem includes:    Pulmicort Flexhaler 180 Mcg/act Aepb (Budesonide) .Marland Kitchen... 2 puffs inhaled bid    Ventolin Hfa 108 (90  Base) Mcg/act Aers (Albuterol sulfate) .Marland Kitchen... 2 puffs every 4 hours as needed for shortness of breath or wheexing  Orders: [facility] - Est  40-64 yrs ([address])  Problem # 3:  MARIJUANA ABUSE (ICD-305.20) Assessment: Unchanged  Advised to quit.  Orders: [facility] - Est  40-64 yrs ([address])  Complete Medication List: 1)  Pulmicort Flexhaler 180 Mcg/act Aepb (Budesonide) .... 2 puffs inhaled bid 2)  Ventolin Hfa 108 (90 Base) Mcg/act Aers (Albuterol sulfate) .... 2 puffs every 4 hours as needed for shortness of breath or wheexing  Patient Instructions: 1)  It was nice to see you today! Prescriptions: VENTOLIN HFA 108 (90 BASE) MCG/ACT AERS (ALBUTEROL SULFATE) 2 puffs every 4 hours as needed for shortness of breath or wheexing  #1 x 11   Entered and Authorized by:   Helane Rima DO   Signed by:   Helane Rima DO on 05/24/2010   Method used:   Electronically to        [facility] [id number] (retail)       [address]       [place], [place]  [address]       Ph: [phone]       Fax: [phone]   RxID:   [id number] PULMICORT FLEXHALER 180 MCG/ACT AEPB (BUDESONIDE) 2 puffs inhaled BID  #3 x 4   Entered and Authorized by:   Helane Rima DO   Signed by:   Helane Rima DO on 05/24/2010   Method used:   Electronically to        [facility] [id number] (retail)       [address] [place], [place]  [address]       Ph: [phone]       Fax: [phone]   RxID:   [id number]   Prevention & Chronic Care Immunizations   Influenza vaccine: Fluvax 3+  (07/26/2008)   Influenza vaccine deferral: Refused  (12/30/2009)   Influenza vaccine due: 07/26/2009    Tetanus booster: 03/22/2005: Done.   Tetanus booster due: 03/23/2015    Pneumococcal vaccine: Not documented  Other Screening   Pap smear: Done.  (07/22/2005)   Pap smear action/deferral: Not indicated S/P hysterectomy  (12/30/2009)   Pap smear due: 07/22/2006     Mammogram: normal  (07/02/2008)   Mammogram action/deferral: Ordered  (12/30/2009)   Mammogram due: 07/02/2009   Smoking status: quit  (05/24/2010)    Screening comments: smoking THC - addvised to quit  Lipids   Total Cholesterol: Not documented   LDL: Not documented   LDL Direct: Not documented   HDL: Not documented   Triglycerides: Not documented   Appended Document: Orders Update    Clinical Lists Changes  Medications: Changed medication from PULMICORT FLEXHALER 180 MCG/ACT AEPB (BUDESONIDE) 2 puffs inhaled BID to QVAR 80 MCG/ACT AERS (BECLOMETHASONE DIPROPIONATE) one puff BID - Signed Rx of QVAR 80 MCG/ACT AERS (BECLOMETHASONE DIPROPIONATE) one puff BID;  #3 x 4;  Signed;  Entered by: Helane Rima  DO;  Authorized by: Helane Rima DO;  Method used: Electronically to [facility] [id number] [place], [place], [place]  [address], Ph: [phone], Fax: [phone]    Prescriptions: QVAR 80 MCG/ACT AERS (BECLOMETHASONE DIPROPIONATE) one puff BID  #3 x 4   Entered and Authorized by:   Helane Rima DO   Signed by:   Helane Rima DO on 05/24/2010   Method used:   Electronically to        [facility] [id number] (retail)       [address] [place], [place]  [address]       Ph: [phone]       Fax: [phone]   RxID:   [id number]    Appended Document: cpe,df Pulmicort not covered by Medicaid. Rx QVAR.

## 2010-11-21 NOTE — Assessment & Plan Note (Signed)
Summary: anxiety,df   Vital Signs:  Patient profile:   45 year old female Height:      65 inches Weight:      150 pounds BMI:     25.05 Temp:     99.2 degrees F oral Pulse rate:   65 / minute BP sitting:   113 / 78  (right arm) Cuff size:   regular  Vitals Entered By: Jimmy Footman, CMA (June 07, 2010 4:15 PM) CC: hospital f/u anxiety Is Patient Diabetic? No Pain Assessment Patient in pain? no        Primary Care Provider:  Helane Rima MD  CC:  hospital f/u anxiety.  History of Present Illness: 45 yo F:  1. Anxiety: patient went to [facility] on 06/04/10 with SOB. she was evaluated and Dx with anxiety. Tx with Xanax 0.5 mg to take 0.5 to 1 mg by mouth three times a day as needed for anxiety. Today, she endorses a few episodes of impending panic attacks that were avoided with 1/2 pill. She is averaging 1/2 pill daily. Stressors include money and boyfriends issues. She is still not smoking marijuana.  Habits & Providers  Alcohol-Tobacco-Diet     Tobacco Status: quit  Allergies (verified): No Known Drug Allergies  Past History:  Past Medical History: Allergic rhinitis COPD Anxiety PMH-FH-SH reviewed for relevance  Review of Systems General:  Denies chills and fever. CV:  Denies chest pain or discomfort, palpitations, and shortness of breath with exertion. Resp:  Denies cough and shortness of breath. GI:  Denies change in bowel habits. Psych:  Complains of anxiety and panic attacks; denies depression, suicidal thoughts/plans, and thoughts /plans of harming others. Endo:  Denies cold intolerance, heat intolerance, and weight change.  Physical Exam  General:  Alert, well-developed, well-nourished, and well-hydrated.  Vitals reviewed. Lungs:  Normal respiratory effort, chest expands symmetrically. Decreased breath sounds bilaterally, but lungs are clear to auscultation, no crackles or wheezes. Heart:  Distant heart sounds. Normal rate and regular rhythm. S1 and S2  normal without gallop, murmur, click, rub or other extra sounds. Psych:  Oriented X3, memory intact for recent and remote, normally interactive, good eye contact, not anxious appearing, and not depressed appearing.     Impression & Recommendations:  Problem # 1:  PANIC ATTACK (ICD-300.01) Assessment New  Patient with new-onset panic attacks after THC cessation. Suspect that she was self-medicating before. She is using the Xanax appropriately, so I will continue to prescribe for a short time with plans to wean. Patient agrees to plan. Her updated medication list for this problem includes:    Alprazolam 0.5 Mg Tabs (Alprazolam) .Marland Kitchen... 1/2 to 1 by mouth up to three times a day as needed for panic attacks  Orders: [facility]- Est Level  3 ([address])  Complete Medication List: 1)  Qvar 80 Mcg/act Aers (Beclomethasone dipropionate) .... One puff bid 2)  Ventolin Hfa 108 (90 Base) Mcg/act Aers (Albuterol sulfate) .... 2 puffs every 4 hours as needed for shortness of breath or wheexing 3)  Alprazolam 0.5 Mg Tabs (Alprazolam) .... 1/2 to 1 by mouth up to three times a day as needed for panic attacks  Patient Instructions: 1)  Follow up in 1 month. Prescriptions: ALPRAZOLAM 0.5 MG TABS (ALPRAZOLAM) 1/2 to 1 by mouth up to three times a day as needed for panic attacks  #30 x 0   Entered and Authorized by:   Helane Rima DO   Signed by:   Helane Rima DO on 06/07/2010  Method used:   Print then Give to Patient   RxID:   [id number]

## 2010-11-21 NOTE — Assessment & Plan Note (Signed)
Summary: flu shot,df  Nurse Visit   Vital Signs:  Patient profile:   45 year old female Temp:     98.7 degrees F  Vitals Entered By: Theresia Lo RN (August 23, 2010 4:23 PM)  Allergies: No Known Drug Allergies  Immunizations Administered:  Influenza Vaccine # 1:    Vaccine Type: Fluvax 3+    Site: right deltoid    Mfr: GlaxoSmithKline    Dose: 0.5 ml    Route: IM    Given by: Theresia Lo RN    Exp. Date: 04/18/2011    Lot #: [id number]    VIS given: 05/16/10 version given August 23, 2010.  Flu Vaccine Consent Questions:    Do you have a history of severe allergic reactions to this vaccine? no    Any prior history of allergic reactions to egg and/or gelatin? no    Do you have a sensitivity to the preservative Thimersol? no    Do you have a past history of Guillan-Barre Syndrome? no    Do you currently have an acute febrile illness? no    Have you ever had a severe reaction to latex? no    Vaccine information given and explained to patient? yes    Are you currently pregnant? no  Orders Added: 1)  Flu Vaccine 60yrs + [90658] 2)  Admin 1st Vaccine [14782]

## 2010-11-21 NOTE — Letter (Signed)
Summary: Generic Letter  [facility]  [address]   [place], [place] [address]   Phone: [phone]  Fax: [phone]    05/25/2010  Kristina Moreno [address] [place], [place]  [address]  Dear Kristina Moreno,  I am happy to inform you that the results of your recent labs were all normal.   Sincerely,   Helane Rima DO  Appended Document: Generic Letter mailed

## 2010-11-23 NOTE — Assessment & Plan Note (Signed)
Summary: boil under arm,df   Vital Signs:  Patient profile:   45 year old female Height:      65 inches Weight:      154.9 pounds BMI:     25.87 Temp:     98.6 degrees F oral Pulse rate:   72 / minute BP sitting:   121 / 83  (left arm) Cuff size:   regular  Vitals Entered By: Jimmy Footman, CMA (November 09, 2010 10:15 AM) CC: boil under left arm Is Patient Diabetic? No Pain Assessment Patient in pain? no        Primary Care Provider:  Helane Rima MD  CC:  boil under left arm.  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  Multiple I&D's in past.  Has not had any for symptoms for past 10 years.  This  area under left axilla started earlier this wek and has progressively gotten worse with pain. No drainage. Has felt otherwise OK.  Habits & Providers  Alcohol-Tobacco-Diet     Tobacco Status: quit  Current Medications (verified): 1)  Qvar 80 Mcg/act Aers (Beclomethasone Dipropionate) .... One Puff Bid 2)  Ventolin Hfa 108 (90 Base) Mcg/act Aers (Albuterol Sulfate) .... 2 Puffs Every 4 Hours As Needed For Shortness of Breath or Wheexing 3)  Alprazolam 0.5 Mg  Tabs (Alprazolam) .... 1/2 To 1 Tab Up To Three Times A Day As Needed Anxiety Attack  Allergies: No Known Drug Allergies  Past History:  Past Medical History: Last updated: 06/07/2010 Allergic rhinitis COPD Anxiety  Past Surgical History: Last updated: 09/01/2008 BTL TVH  Social History: Last updated: 09/01/2008 Divorced, lives with 2 kids, Ethelene Browns (college) and Streetman.  Works part time at [employer].  Likes to spend time with family, watch movies.  Smokes 1/2 ppd x 30 years but quit.  Etoh one beer per night and more on weekends.  Heavy marijuana smoker.  Distant history of crack use (>15) .  No exercise.  Review of Systems  The patient denies anorexia, fever, weight loss, and weight gain.         no arm pain or  numbness  Physical Exam  General:  alert, well-developed, well-nourished, and well-hydrated.   Skin:  Left axilla reveals a fluctuant mass 2 cm diameter, no erythema or induration surrounding. Additional Exam:  patient given informed consent for procedire, signed copy in the chart. appropriate time out taken. Area prepped and draped in usual sterile fashion. 3 cc 1% lidocaine without epi used for local anasthesia. A simple stab incision was made and small amout of ous drained. the area was then gently explored by externalpressure and fingertips and another pocket of ous was localized. This area was drained also usiong asimple stab incision.  Antibiotic ointment was applied as was a bandage. Less than 3 cc blood loss. Patient tolerated procedure well   Impression & Recommendations:  Problem # 1:  CELLULITIS AND ABSCESS OF TRUNK (ICD-682.2)  Orders: I&D Abcess, simple- [facility] (10060) do not think she needs antibiotics as there is little surrounding erythema or induration. Post procedure instuctions given, RTC prn  Complete Medication List: 1)  Qvar 80 Mcg/act Aers (Beclomethasone dipropionate) .... One puff bid 2)  Ventolin Hfa 108 (90 Base) Mcg/act Aers (Albuterol sulfate) .... 2 puffs every 4 hours as needed for shortness of breath or wheexing 3)  Alprazolam 0.5 Mg Tabs (Alprazolam) .... 1/2 to 1 tab up to three times a  day as needed anxiety attack   Orders Added: 1)  I&D Abcess, simple- [facility] [10060]

## 2010-11-23 NOTE — Miscellaneous (Signed)
Summary: Consent: I&D of boil  Consent: I&D of boil   Imported By: Knox Royalty 11/16/2010 11:46:39  _____________________________________________________________________  External Attachment:    Type:   Image     Comment:   External Document

## 2010-11-28 ENCOUNTER — Encounter: Payer: Self-pay | Admitting: *Deleted

## 2011-01-22 ENCOUNTER — Inpatient Hospital Stay (HOSPITAL_COMMUNITY)
Admission: AD | Admit: 2011-01-22 | Discharge: 2011-01-22 | Disposition: A | Discharge status: Home / Self Care | Payer: Medicaid Other | Source: Ambulatory Visit | Attending: Obstetrics & Gynecology | Admitting: Obstetrics & Gynecology

## 2011-01-22 DIAGNOSIS — L738 Other specified follicular disorders: Secondary | ICD-10-CM

## 2011-01-23 ENCOUNTER — Ambulatory Visit: Payer: Self-pay | Admitting: Family Medicine

## 2011-01-29 ENCOUNTER — Ambulatory Visit: Payer: Self-pay | Admitting: Family Medicine

## 2011-02-17 ENCOUNTER — Inpatient Hospital Stay (INDEPENDENT_AMBULATORY_CARE_PROVIDER_SITE_OTHER)
Admission: RE | Admit: 2011-02-17 | Discharge: 2011-02-17 | Disposition: A | Discharge status: Home / Self Care | Payer: Medicaid Other | Source: Ambulatory Visit | Attending: Emergency Medicine | Admitting: Emergency Medicine

## 2011-02-17 DIAGNOSIS — J309 Allergic rhinitis, unspecified: Secondary | ICD-10-CM

## 2011-02-20 ENCOUNTER — Ambulatory Visit (INDEPENDENT_AMBULATORY_CARE_PROVIDER_SITE_OTHER): Payer: Medicaid Other | Admitting: Family Medicine

## 2011-02-20 VITALS — BP 120/83 | HR 81 | Temp 98.6°F | Ht 65.0 in | Wt 148.0 lb

## 2011-02-20 DIAGNOSIS — R42 Dizziness and giddiness: Secondary | ICD-10-CM

## 2011-02-20 MED ORDER — MECLIZINE HCL 25 MG PO TABS: 25.0000 mg | ORAL_TABLET | Freq: Three times a day (TID) | ORAL | Status: DC | PRN

## 2011-02-20 NOTE — Patient Instructions (Signed)
It was nice to see you today.  I prescribed Antivert for your dizziness.  We will call with the referral to physical therapy.

## 2011-02-22 ENCOUNTER — Encounter: Payer: Self-pay | Admitting: Family Medicine

## 2011-02-22 NOTE — Progress Notes (Signed)
  Subjective:    Patient ID: Kristina Moreno, female    DOB: 07-May-1966, 45 y.o.   MRN: [id number]  HPI  1. Dizziness: x 1 day, noticed upon waking and moving head, feels like room spinning, associated with N/V x 1 - NBNB. No fever/chills, current URI s/s, vision changes, HA, ear pain, tinnitus, sore throat, CP, SOB, N/V/D, LE edema, hear palpitations, fall, head trauma. She endorses recent  Viral URI, ear fullness, current THC use, and ETOH use.    Review of Systems SEE HPI.    Objective:   Physical Exam  Vitals reviewed. Constitutional: She appears well-developed and well-nourished.  HENT:  Head: Normocephalic and atraumatic.  Right Ear: External ear normal.  Left Ear: External ear normal.  Nose: Nose normal.  Mouth/Throat: Oropharynx is clear and moist.  Eyes: Conjunctivae and EOM are normal. Pupils are equal, round, and reactive to light.  Neck: Normal range of motion. Neck supple. No thyromegaly present.  Cardiovascular: Normal rate, regular rhythm, normal heart sounds and intact distal pulses.   Pulmonary/Chest: Effort normal and breath sounds normal.  Neurological: She is alert. She has normal reflexes. She displays normal reflexes. No cranial nerve deficit. Coordination normal.       Dix-Hallpike negative, though caused discomfort.  Psychiatric:       Slightly anxious.      Assessment & Plan:

## 2011-02-22 NOTE — Assessment & Plan Note (Addendum)
History c/w vertigo, even with negative Dix-Hallpike. Discussed Dx and self-limited nature. Rx Antivert. Refer to PT. Precautions given. Follow up in 2 weeks for CPE and recheck. DDx includes anxiety.

## 2011-03-06 NOTE — Group Therapy Note (Signed)
NAMEJULES, Kristina Moreno NO.:  [id number]   MEDICAL RECORD NO.:  [id number]          PATIENT TYPE:  WOC   LOCATION:  [facility]                   FACILITY:  [facility]   PHYSICIAN:  Tinnie Gens, MD        DATE OF BIRTH:  [birth year]   DATE OF SERVICE:  06/20/2007                                  CLINIC NOTE   CHIEF COMPLAINT:  Yearly exam.   PRESENT ILLNESS:  The patient is a 45 year old gravida 3, para 2-0-1-2,  who is here for an annual exam.  She previously underwent transvaginal  hysterectomy April 03, 2006.  She has been doing well except for she has  had some left lower quadrant pain and vaginal discharge over the past 3  weeks.  She has had no vaginal bleeding, no fevers, no chills, no nausea  or vomiting.  She has not had her mammogram.  The last one was in  February 2007.   PAST MEDICAL HISTORY:  Positive for a new diagnosis of COPD.   PAST SURGICAL HISTORY:  1. BTL, 1997.  2. TVH June 2007.   MEDICATIONS:  Ventolin p.r.n., Flovent inhaler b.i.d.   ALLERGIES:  None known.   FAMILY HISTORY:  Positive for leukemia in her father.   OBSTETRICAL HISTORY:  G3, P2 with two vaginal deliveries.   GYN HISTORY:  History of cryo for abnormal Pap approximately 12 years  ago.   PHYSICAL EXAM:  VITALS:  As noted in the chart.  Well-developed, well-nourished female in no acute distress.  HEENT:  Normocephalic, atraumatic.  Sclerae anicteric.  NECK:  Supple.  Normal thyroid.  LUNGS:  Clear bilaterally.  CV:  Regular rate and rhythm, no rubs, gallops or murmurs.  ABDOMEN:  Soft, nontender, nondistended.  EXTREMITIES:  No cyanosis, clubbing or edema.  2+ distal pulses.  BREASTS:  Symmetric with everted nipples.  No significant mass though  she has some diffuse fibrocystic change.  There is no supraclavicular or axillary adenopathy.  GU:  Normal external female genitalia.  BUS is normal.  Vagina is pink  and rugated.  There is good support.  The cervix and uterus are  absent.  There is no pelvic mass noted.  There is some thin white discharge  noted.   IMPRESSION:  1. Yearly exam.  No need for further Papanicolaou smears.  2. Pelvic pain and vaginal discharge.   PLAN:  1. GC, chlamydia, wet prep today.  2. Follow up mammogram.           ______________________________  Tinnie Gens, MD     TP/MEDQ  D:  06/20/2007  T:  06/21/2007  Job:  [id number]

## 2011-03-09 NOTE — Group Therapy Note (Signed)
NAMESTARLEE, CORRALEJO NO.:  [id number]   MEDICAL RECORD NO.:  [id number]          PATIENT TYPE:  WOC   LOCATION:  [facility]                   FACILITY:  [facility]   PHYSICIAN:  Tinnie Gens, MD        DATE OF BIRTH:  Sep 10, 1966   DATE OF SERVICE:  06/29/2005                                    CLINIC NOTE   CHIEF COMPLAINT:  Vaginal discharge.   HISTORY OF PRESENT ILLNESS:  The patient is a 45 year old gravida 3 para 2-0-  1-2 who comes in today with a several-day history of vaginal discharge. She  notes that is clear in nature but has an ammonia-like odor. It has been  going on for approximately 3 weeks. No itching, no irritation. The patient  denies any nausea, vomiting, abdominal pain, fever.   EXAMINATION:  VITAL SIGNS:  As noted in the chart.  GENERAL:  She is a well-developed, well-nourished black female in no acute  distress.  ABDOMEN:  Soft, nontender, nondistended.  GENITOURINARY:  Normal external female genitalia. The vagina is pink and  rugated. The cervix is nulliparous and without lesion. There is a faint  yellow discharge noted. The uterus is approximately 8 weeks size. The adnexa  are without mass or tenderness.   Wet prep shows positive clue cells, no Trichomonas.   IMPRESSION:  Bacterial vaginosis.   PLAN:  Flagyl 500 mg one p.o. b.i.d. for 7 days. GC and chlamydia test  pending.           ______________________________  Tinnie Gens, MD     TP/MEDQ  D:  06/29/2005  T:  06/29/2005  Job:  [id number]

## 2011-03-09 NOTE — Group Therapy Note (Signed)
Kristina, Moreno NO.:  [id number]   MEDICAL RECORD NO.:  [id number]          PATIENT TYPE:  WOC   LOCATION:  [facility]                   [facility]ACILITY[facility]   PHYSICIAN:  Tinnie Gens, MD        DATE OF BIRTH:  12/27/1965   DATE OF SERVICE:  11/30/2005                                    CLINIC NOTE   CHIEF COMPLAINT:  Follow-up.   HISTORY OF PRESENT ILLNESS:  Patient is a 45 year old gravida 3, para 2-0-1-  2 who was seen two weeks ago where she underwent Pap smear, endometrial  sampling, mammography, and evaluation for fibroid uterus.  The patient has  continued to have worsening pelvic pain, pressure, and heavy vaginal  bleeding.  All these tests have come back negative and she is here today to  talk about these results as well as discuss treatment options.  Also, a TSH  was taken which was normal as well.  The patient still has increasing pelvic  pressure and pain and she would like something definitive done for this as  well as the bleeding.  A lengthy discussion was had with the patient  regarding D&C, endometrial ablation, and alternatives as far as  hysterectomy.  However, given her pain and her smoking status it is felt she  is not a good candidate for medical manipulation nor is it felt this will  adequately treat the pain she is having.  Patient has already been on anti-  inflammatories which is not helping.   PHYSICAL EXAMINATION:  VITAL SIGNS:  As noted in the chart.  GENERAL:  She is a well-developed, well-nourished black female in no acute  distress.  ABDOMEN:  Soft, nontender, nondistended.   IMPRESSION:  1.  Fibroid uterus.  2.  Pelvic pain.  3.  Abnormal uterine bleeding.   PLAN:  1.  The patient would like hysterectomy scheduled after school lets out      which would be about mid June.  Given that her uterus is approximately      14 weeks size it is felt that she would be a good candidate for Depo      Lupron approximately three months  prior to the surgery.  2.  The patient will return in mid March for Depo Lupron and then again in      May for posting and reexamination of her uterus.           ______________________________  Tinnie Gens, MD     TP/MEDQ  D:  11/30/2005  T:  11/30/2005  Job:  [id number]

## 2011-03-09 NOTE — Discharge Summary (Signed)
NAMEDEVANN, CRIBB                ACCOUNT NO.:  [id number]   MEDICAL RECORD NO.:  [id number]           PATIENT TYPE:   LOCATION:                                [facility]   PHYSICIAN:  Tanya S. Shawnie Pons, M.D.   DATE OF BIRTH:  29-Jul-1966   DATE OF ADMISSION:  04/03/2006  DATE OF DISCHARGE:  04/05/2006                                 DISCHARGE SUMMARY   FINAL DIAGNOSES:  1.  Fibroid uterus.  2.  Pelvic pain.  3.  Menorrhagia.  4.  Acute on chronic anemia secondary to bleeding and operative blood loss.   PROCEDURES:  Transvaginal hysterectomy.   PERTINENT LABORATORY DATA:  Normal BNP.  Pregnancy test was negative.  Preoperative hemoglobin 9.1, postoperative hemoglobin 7.2.  Blood type is O  positive, antibody screen was negative.   REASON FOR ADMISSION:  Briefly, the patient is 45 year old gravida 3, para 2-  0-1-2 who had a 14-week sized fibroid uterus, multiple fibroids and heavy  bleeding and anemia who desired permanent treatment for this.   HOSPITAL COURSE:  The patient was admitted and underwent the above procedure  on the day of admission.  She then postoperatively was transferred to the  floor where she did well.  Her catheter was removed on postoperative day #1.  She was able to void without difficulty.  She was tolerating regular diet.  Began passing flatus.  On postoperative day #2, had minimal bleeding  postoperatively and no signs of orthostasis or dizziness with ambulation.  The patient remained afebrile throughout her hospital course and on  postoperative day #2 was felt to be stable for discharge.   DISPOSITION:  Patient discharged home in good condition.  The patient was  instructed to return with persistent nausea or vomiting or fever greater  than 101.   FOLLOWUP:  Follow up will be at the GYN clinic in two weeks.   DISCHARGE MEDICATIONS:  1.  Percocet 5/325 1-2 p.o. q.4-6 h pain #40 with no refills.  2.  Chromagen one p.o. b.i.d. for the next six  weeks.   DISCHARGE INSTRUCTIONS:  1.  Diet:  Increase roughage in diet to ward off constipation.  2.  Activity:  Pelvic rest for the next four weeks.  No heavy lifting for      the next two weeks.  No driving for one week.           ______________________________  Shelbie Proctor. Shawnie Pons, M.D.     TSP/MEDQ  D:  04/05/2006  T:  04/05/2006  Job:  [id number]

## 2011-03-09 NOTE — Op Note (Signed)
NAMESADA, MAZZONI                ACCOUNT NO.:  [id number]   MEDICAL RECORD NO.:  [id number]           PATIENT TYPE:   LOCATION:                                [facility]   PHYSICIAN:  Tanya S. Shawnie Pons, M.D.   DATE OF BIRTH:  [birth year]   DATE OF PROCEDURE:  04/03/2006  DATE OF DISCHARGE:                                 OPERATIVE REPORT   PREOPERATIVE DIAGNOSIS:  Fibroid uterus, abnormal bleeding, pelvic pain, and  chronic anemia.   POSTOPERATIVE DIAGNOSIS:  Fibroid uterus, abnormal bleeding, pelvic pain,  and chronic anemia.   PROCEDURE:  Transvaginal hysterectomy.   SURGEON:  Shelbie Proctor. Shawnie Pons, M.D.   ASSISTANT:  Ginger Carne, M.D.   ANESTHESIA:  General and local.   SPECIMENS:  Uterus.   ESTIMATED BLOOD LOSS:  350 mL.   COMPLICATIONS:  None.   REASON FOR PROCEDURE:  Briefly, the patient is a 45 year old gravida 3, para  2-0-1-2, who had two previous vaginal deliveries and a tubal ligation in  1997, who has a 14-week size fibroid uterus with multiple fibroids noted on  pelvic ultrasound, who had heavy bleeding and pelvic pain for many months  and who desired permanent treatment and hysterectomy.   DESCRIPTION OF PROCEDURE:  The patient was taken to the OR where she was  placed in dorsal lithotomy in [place] stirrups.  She was prepped and draped in  the usual sterile fashion.  A weighted speculum was placed inside the vagina  posteriorly and a Deaver used anteriorly.  The cervix was visualized,  grasped with a clamp both anteriorly and posteriorly, and then infiltrated  with 10 mL of 1% lidocaine with epinephrine.  A circumferential incision was  then made with the knife.  The vagina was then dissected bluntly off the  underlying cervix circumferentially.  Anteriorly sharp dissection was used  until the peritoneal cavity was entered.  The posterior peritoneal cavity  was also entered and the posterior peritoneum tagged to the posterior  vagina.  A long weighted  speculum was placed then inside the peritoneal  cavity posteriorly and a Deaver used anteriorly.  The uterosacral ligaments  were then clamped bilaterally, cut, and suture ligated with a Heaney stitch  and held.  The uterine arteries were then sequentially clamped, cut, and  suture ligated bilaterally.  Once this was done, coring of the uterus began.  Several fibroids were removed.  The uterus was taken out in several pieces.  Eventually the fundus of the uterus was found and the infundibulopelvic  ligaments were clamped bilaterally and the specimen removed.  There was a  rent in the utero-ovarian pedicle on the patient's left side and this was  grasped with a Babcock clamp, brought forward, and then clamped with a  Heaney clamp.  Both utero-pelvic ligaments were first free tied followed by  a suture ligature and good hemostasis was obtained bilaterally.  Attention  was then returned to the uterosacrals and the pedicles were inspected.  There was some bleeding from the right uterine artery and this was clamped  with a Heaney clamp and  suture ligated with a Heaney tag stitch.  Additionally, the posterior cuff was run with a 0 Vicryl suture in a locking  running fashion inclusive of the two uterosacral ligaments.  The two  uterosacral ligaments were then tied together and the rest of the vaginal  cuff was closed with a running suture.  There was a small amount of bleeding  after this and two figure-of-eights were used on the cuff to create  excellent hemostasis.  A Foley catheter was placed inside the bladder and  clear yellow urine was noted.  The uterus still weighed 490 grams in toto.  All needle, sponge, and instrument counts correct x2.  The patient was  awakened and taken to the recovery room in stable condition.           ______________________________  Shelbie Proctor Shawnie Pons, M.D.     TSP/MEDQ  D:  04/03/2006  T:  04/03/2006  Job:  [id number]

## 2011-03-09 NOTE — Group Therapy Note (Signed)
NAMEDUSTY, WAGONER NO.:  [id number]   MEDICAL RECORD NO.:  [id number]          PATIENT TYPE:  WOC   LOCATION:  [facility]                   [facility]ACILITY:  [facility]   PHYSICIAN:  Tinnie Gens, MD        DATE OF BIRTH:  03-14-1966   DATE OF SERVICE:                                    CLINIC NOTE   CHIEF COMPLAINT:  Yearly exam and fibroids.   HISTORY OF PRESENT ILLNESS:  Patient is a 45 year old gravida 3, para 2-0-1-  2, who comes here for her annual exam.  She was previously seen in September  of last year with a complaint of vaginal discharge.  Her last Pap smear was  November of 2005.  Her last mammogram was in December of 2005.  The patient  apparently was seen in the Maternity Admissions Unit where she was found to  have an enlarged uterus.  At that point, she underwent a pelvic sonography,  which revealed a fibroid uterus.  It was approximately 14 weeks' size with  multiple fibroids noted.  The patient has a strong family history of fibroid  uterus.  She notes some pelvic pain and abnormal bleeding, which had been  worse over the past year.  Her cycles have lengthened to approximately 7  days in length and her bleeding seems exceptionally heavy to her.   PAST MEDICAL HISTORY:  Negative.   PAST SURGICAL HISTORY:  BTL in 1997.   MEDICATIONS:  1.  Ibuprofen 800 mg p.r.n. pain.  2.  Vandazole 0.75% gel p.r.n. for BV.   ALLERGIES:  NONE KNOWN.   FAMILY HISTORY:  Father has leukemia.  There is no diabetes and no  hypertension, but positive fibroids.   OBSTETRIC HISTORY:  She is a G3, P2 with two vaginal deliveries previously.   GYNECOLOGIC HISTORY:  History of cryo for abnormal Pap approximately 10  years ago.   SOCIAL HISTORY:  Positive smoker approximately a third to a half pack per  day.  She does use recreational marijuana and alcohol on the weekends only.  The patient lives with her two sons, one 34 and one nine.   PHYSICAL EXAMINATION:  VITAL SIGNS:   As noted in the chart.  GENERAL: She is a well-developed, well-nourished black female in no acute  distress.  HEENT:  Normocephalic, atraumatic.  Sclerae are anicteric.  NECK:  Supple.  Normal thyroid.  ABDOMEN:  Soft, nontender and nondistended.  EXTREMITIES:  No cyanosis, clubbing or edema.  Distal pulses are 2+.  BREAST:  Symmetric with everted nipples.  She has some diffuse fibrocystic  changes in the outer quadrants bilaterally, but no discrete mass.  There is  no supraclavicular or axillary adenopathy.  GENITOURINARY:  Normal external female genitalia.  The vagina is pink and  rugated.  The cervix visualized.  The old cryo scar can be seen.  Pap smear  is obtained.  The uterus is large and firm with a lot of nodularity.  It is  approximately 12 to 14 weeks' size.  The adnexa were without significant  mass or tenderness.   PROCEDURE:  After informed consent, the patient's cervix was cleaned with  Betadine x2.  The uterus was sound to approximately 7 cm and endometrial  sampling was obtained.   IMPRESSION:  1.  Abnormal uterine bleeding.  2.  Fibroid uterus.  3.  Yearly exam.   PLAN:  1.  Pap smear today.  2.  Mammogram.  3.  Lipid panel.  4.  Check TSH.  It could be giving her abnormal bleeding.  5.  Endometrial sampling to rule out endometrial hyperplasia or carcinoma.  6.  The patient will follow up in two weeks for discussion of results.  The      patient is not a good candidate for hormonal manipulation given her age      and smoking status, and definite change in her exam over the last 12      months shows enlarging fibroids.  We will attempt to discuss with her      hysterectomy at a later date.  She would be a good candidate for a Depo      Lupron followed by vaginal hysterectomy if we can shrink her fibroids      some.           ______________________________  Tinnie Gens, MD     TP/MEDQ  D:  11/09/2005  T:  11/10/2005  Job:  [id number]

## 2011-03-09 NOTE — Discharge Summary (Signed)
Kristina Moreno, Kristina Moreno                ACCOUNT NO.:  [id number]   MEDICAL RECORD NO.:  [id number]          PATIENT TYPE:  INP   LOCATION:  9302                          [facility]   PHYSICIAN:  Tanya S. Shawnie Pons, M.D.   DATE OF BIRTH:  December 29, 1965   DATE OF ADMISSION:  04/27/2006  DATE OF DISCHARGE:  04/29/2006                                 DISCHARGE SUMMARY   FINAL DIAGNOSES:  1.  Status post total vaginal hysterectomy.  2.  Fever.  3.  Abdominal pain.  4.  Questionable cuff abscess.   PERTINENT LABS INCLUDE:  A white count of 14.7, hemoglobin of 9.2, which is  up from discharge hemoglobin of 7.  Electrolytes that were within normal  limits.  Creatinine is 0.8.  LFT within normal limits.   RADIOGRAPHIC STUDIES:  Revealed bilateral complex cystic masses, left  greater than right.  There is minimal fluid in the pelvis following  hysterectomy and management of left ovarian vein thrombosis.  Reviewing the  body on the CT actually looks like these may be related to the ovaries and  cystic in nature.  Blood cultures were negative times two.   REASON FOR ADMISSION:  Briefly, the patient is a 45 year old, who was 23  days status post TVH, who came in with abdominal pain, fever.  She underwent  CT scanning, which showed the above findings.  She was admitted with  probable pelvic abscess, was started on triple antibiotics.  The patient  remained afebrile for approximately 48 hours.  She initially had a little  bit of nausea and vomiting, was on Phenergan and Zofran, which corrected  this.  She was tolerating a regular diet on the day of discharge.  Given she  had been afebrile and her vitals were stable and that she was able to  tolerate a regular diet, it was felt the patient was stable for discharge.   DISCHARGE DISPOSITION AND CONDITION:  The patient was discharged home in  good condition.   FOLLOWUP:  Her followup will be in GYN Clinic in approximately 2-3 weeks.  She is to keep her  next scheduled appointment.   DISCHARGE MEDICATIONS INCLUDED:  1.  Dilaudid 2-4 mg every 6 hours as needed for pain.  2.  Doxycycline 100 mg one p.o. twice daily for 10 days.  3.  Flagyl 500 mg p.o. twice daily for 10 days.   DISCHARGE INSTRUCTIONS:  The patient was instructed to return for fever,  chills, or persistent nausea and vomiting.          ______________________________  Shelbie Proctor. Shawnie Pons, M.D.    TSP/MEDQ  D:  04/29/2006  T:  04/29/2006  Job:  [id number]

## 2011-03-16 ENCOUNTER — Encounter: Payer: Self-pay | Admitting: Family Medicine

## 2011-03-16 ENCOUNTER — Ambulatory Visit (INDEPENDENT_AMBULATORY_CARE_PROVIDER_SITE_OTHER): Payer: Medicaid Other | Admitting: Family Medicine

## 2011-03-16 DIAGNOSIS — Z131 Encounter for screening for diabetes mellitus: Secondary | ICD-10-CM

## 2011-03-16 DIAGNOSIS — N760 Acute vaginitis: Secondary | ICD-10-CM | POA: Insufficient documentation

## 2011-03-16 DIAGNOSIS — Z124 Encounter for screening for malignant neoplasm of cervix: Secondary | ICD-10-CM

## 2011-03-16 DIAGNOSIS — Z13220 Encounter for screening for lipoid disorders: Secondary | ICD-10-CM

## 2011-03-16 DIAGNOSIS — R42 Dizziness and giddiness: Secondary | ICD-10-CM

## 2011-03-16 DIAGNOSIS — J449 Chronic obstructive pulmonary disease, unspecified: Secondary | ICD-10-CM

## 2011-03-16 DIAGNOSIS — Z01419 Encounter for gynecological examination (general) (routine) without abnormal findings: Secondary | ICD-10-CM

## 2011-03-16 DIAGNOSIS — F410 Panic disorder [episodic paroxysmal anxiety] without agoraphobia: Secondary | ICD-10-CM

## 2011-03-16 LAB — POCT WET PREP (WET MOUNT): Trichomonas Wet Prep HPF POC: NEGATIVE

## 2011-03-16 MED ORDER — METRONIDAZOLE 500 MG PO TABS: 500.0000 mg | ORAL_TABLET | Freq: Two times a day (BID) | ORAL | Status: AC

## 2011-03-16 MED ORDER — ALPRAZOLAM 0.5 MG PO TABS: ORAL_TABLET | ORAL | Status: DC

## 2011-03-16 MED ORDER — ALBUTEROL SULFATE HFA 108 (90 BASE) MCG/ACT IN AERS: 2.0000 | INHALATION_SPRAY | RESPIRATORY_TRACT | Status: DC | PRN

## 2011-03-16 MED ORDER — MECLIZINE HCL 25 MG PO TABS: 25.0000 mg | ORAL_TABLET | Freq: Three times a day (TID) | ORAL | Status: DC | PRN

## 2011-03-16 NOTE — Patient Instructions (Signed)
Meniere's Disease You have Meniere's disease. Meniere's disease is a term for the recurrent symptoms (problems) of vertigo (the room or you seem to spin around), tinnitus (ringing in the ears), and hearing loss. The cause is unknown. These episodes often come on suddenly and without warning. They are sometimes associated with nausea (feeling sick to your stomach) and vomiting. There is no cure. A number of strategies may help the symptoms. Surgical help is available if conservative measures fail. HOME CARE INSTRUCTIONS  If you smoke, STOP.   Restrict your salt intake.   Stop caffeine consumption (caffeinated coffee, sodas, and chocolate).   Do not drive during or near the time of attacks.   Over the counter Antivert (meclizine) at a 25 mg dosage 3 times per day may be helpful.  SEEK IMMEDIATE MEDICAL CARE IF:  Nausea and vomiting are continuous.   You have no relief from vertigo.  MAKE SURE YOU:   Understand these instructions.   Will watch your condition.   Will get help right away if you are not doing well or get worse.  Document Released: 10/05/2000 Document Re-Released: 09/20/2008 [facility] Patient Information 2011 [place], [place].

## 2011-03-16 NOTE — Assessment & Plan Note (Signed)
+  BV

## 2011-03-26 ENCOUNTER — Encounter: Payer: Self-pay | Admitting: Family Medicine

## 2011-03-26 NOTE — Assessment & Plan Note (Signed)
Refilled medication. Possible used rarely.

## 2011-03-26 NOTE — Assessment & Plan Note (Signed)
Refilled medication. Red Flags reviewed.

## 2011-03-26 NOTE — Progress Notes (Signed)
  Subjective:    Patient ID: Kristina Moreno, female    DOB: [birth year], 45 y.o.   MRN: [id number]  HPI  1. COPD: She is requesting a refill of her Albuterol. She uses her Albuterol once every few months. She smoked 1/2 ppd x 30 years, but quit several years ago. She quit heavy marijuana use (~ 6 joints daily) since issues with vertigo.   2. Vertigo: Possible Meniere's.  Intermittent, noticed upon waking and moving head, feels like room spinning, associated with N/V x 1 - NBNB. No fever/chills, current URI s/s, vision changes, HA, ear pain, tinnitus, sore throat, CP, SOB, N/V/D, LE edema, hear palpitations, fall, head trauma. She endorses recent Viral URI, ear fullness. Better with Antivert prn. No new s/s. Declined PT.  3. Health Maintenance: She has had a partial hysterectomy (re: fibroids) and does not require paps any longer. She has gotten yearly mammograms since the age of 42 at the [facility]. No family history of colon cancer. She does have a family history of osteoporosis.  She denies CP, SOB, cough, N/V/D/C, lower extremity swelling, headaches, dizziness, abdominal pain.  Review of Systems SEE HPI.   Objective:   Physical Exam General:  Alert, well-developed, well-nourished, and well-hydrated.  Vitals reviewed. Head:  Normocephalic and atraumatic without obvious abnormalities.  Eyes:  No corneal or conjunctival inflammation noted. EOMI. Perrla. Vision grossly normal. Ears:  R ear normal and L ear normal.   Nose:  External nasal examination shows no deformity or inflammation. Nasal mucosa are pink and moist without lesions or exudates. Mouth:  Oral mucosa and oropharynx without lesions or exudates.  Poor dentition. Neck:  No deformities, masses, or tenderness noted. Lungs:  Normal respiratory effort, chest expands symmetrically. Decreased breath sounds bilaterally, but lungs are clear to auscultation, no crackles or wheezes. Heart:  Distant heart sounds. Normal rate and regular rhythm. S1  and S2 normal without gallop, murmur, click, rub or other extra sounds. Abdomen:  Bowel sounds positive,abdomen soft and non-tender without masses, organomegaly or hernias noted. Genitalia:  Normal introitus, no external lesions, vaginal discharge, no friaility or hemorrhage, normal uterus size and position, and no adnexal masses or tenderness.   Pulses:  2 + DP. Extremities:  No clubbing, cyanosis, or edema.  Neurologic:  Alert & oriented X3, cranial nerves II-XII intact, strength normal in all extremities, sensation intact to light touch, gait normal, and DTRs symmetrical and normal.   Skin:  Intact without suspicious lesions or rashes.    Assessment & Plan:

## 2011-03-26 NOTE — Assessment & Plan Note (Signed)
Refilled medication

## 2011-04-05 ENCOUNTER — Telehealth: Payer: Self-pay | Admitting: Family Medicine

## 2011-04-05 DIAGNOSIS — T753XXA Motion sickness, initial encounter: Secondary | ICD-10-CM

## 2011-04-05 NOTE — Telephone Encounter (Signed)
Patient is still having vertigo and is wanting to get a patch to wear when she goes on cruise this Saturday.  Is a rx needed for this?

## 2011-04-05 NOTE — Telephone Encounter (Signed)
Called patient, says she was told by the pharmacist that she will need Rx for patch to help with her vertigo. Patient did not know what the name of the med is and wants something stronger than over the counter. Leaves tomorrow at 2 for cruise. [facility] [address]. Forward to wallace.Lisette Mancebo, Rodena Medin

## 2011-04-05 NOTE — Telephone Encounter (Signed)
I don't understand. Why would she need a patch for vertigo? It is not indicated, but if she wants one - she can get it over the counter.

## 2011-04-06 DIAGNOSIS — T753XXA Motion sickness, initial encounter: Secondary | ICD-10-CM | POA: Insufficient documentation

## 2011-04-06 MED ORDER — SCOPOLAMINE 1 MG/3DAYS TD PT72: 1.0000 | MEDICATED_PATCH | TRANSDERMAL | Status: DC

## 2011-04-06 NOTE — Telephone Encounter (Signed)
Sent Rx to pharmacy  

## 2011-04-10 ENCOUNTER — Telehealth: Payer: Self-pay | Admitting: Family Medicine

## 2011-04-10 DIAGNOSIS — Z0100 Encounter for examination of eyes and vision without abnormal findings: Secondary | ICD-10-CM

## 2011-04-10 NOTE — Telephone Encounter (Signed)
Referral already in for PT, Dx Vertigo. Letter now complete.

## 2011-04-10 NOTE — Telephone Encounter (Signed)
[name] is still waiting for physical therapy and opthomalogy referrals.  Don't see where there was one completed.  Please call her asap with info.

## 2011-04-10 NOTE — Telephone Encounter (Signed)
Please put in referrals and referral letters.[place], Rodena Medin

## 2011-04-19 ENCOUNTER — Telehealth: Payer: Self-pay | Admitting: Family Medicine

## 2011-04-19 ENCOUNTER — Other Ambulatory Visit: Payer: Self-pay | Admitting: Family Medicine

## 2011-04-19 DIAGNOSIS — R42 Dizziness and giddiness: Secondary | ICD-10-CM

## 2011-04-19 MED ORDER — MECLIZINE HCL 25 MG PO TABS: 25.0000 mg | ORAL_TABLET | Freq: Three times a day (TID) | ORAL | Status: DC | PRN

## 2011-04-19 NOTE — Telephone Encounter (Signed)
Spoke with patient she never took the Rx for BV, told her to take the medication and if not better next week to schedule a follow up appointment. Also explained that we did not do a pap because she had a hysterectomy. She will need to find a optometrist that accepts medicaid and then we can send in Kristina Moreno, Kristina Moreno

## 2011-04-19 NOTE — Telephone Encounter (Signed)
[name] would like to speak with someone about the referral for the optometrist, the results of her PAP Smear and she needs a refill of Antivert.  I asked her to call her pharmacy and ask them to send a request for the Antivert.

## 2011-04-20 ENCOUNTER — Other Ambulatory Visit: Payer: Self-pay | Admitting: Family Medicine

## 2011-04-20 NOTE — Telephone Encounter (Signed)
Called patient and asked her to find an optometrist that would accept medicaid and let us know so we could send the referral.Tylin Stradley, Rodena Medin

## 2011-04-21 NOTE — Telephone Encounter (Signed)
Refill request

## 2011-04-22 NOTE — Telephone Encounter (Signed)
Refill request

## 2011-04-23 MED ORDER — METRONIDAZOLE 500 MG PO TABS: 500.0000 mg | ORAL_TABLET | Freq: Two times a day (BID) | ORAL | Status: AC

## 2011-04-23 NOTE — Telephone Encounter (Signed)
Forward to [employer] [employer]

## 2011-04-23 NOTE — Telephone Encounter (Signed)
Rx for Flagyl re-sent to pharmacy for tx of BV

## 2011-04-23 NOTE — Telephone Encounter (Signed)
States that she never got the meds to take for her BV - needs it called to [facility]

## 2011-04-24 NOTE — Telephone Encounter (Signed)
Attempted to call patient, please tell her Rx was sent to pharmacy.Nyelle Wolfson, Rodena Medin

## 2011-05-02 ENCOUNTER — Ambulatory Visit: Payer: Medicaid Other | Attending: Family Medicine | Admitting: Physical Therapy

## 2011-05-02 DIAGNOSIS — IMO0001 Reserved for inherently not codable concepts without codable children: Secondary | ICD-10-CM | POA: Insufficient documentation

## 2011-05-02 DIAGNOSIS — R269 Unspecified abnormalities of gait and mobility: Secondary | ICD-10-CM | POA: Insufficient documentation

## 2011-05-02 DIAGNOSIS — R42 Dizziness and giddiness: Secondary | ICD-10-CM | POA: Insufficient documentation

## 2011-05-09 ENCOUNTER — Ambulatory Visit: Payer: Medicaid Other | Admitting: Physical Therapy

## 2011-05-16 ENCOUNTER — Ambulatory Visit: Payer: Medicaid Other | Admitting: Rehabilitative and Restorative Service Providers"

## 2011-05-21 ENCOUNTER — Telehealth: Payer: Self-pay | Admitting: Family Medicine

## 2011-05-21 ENCOUNTER — Telehealth: Payer: Self-pay | Admitting: *Deleted

## 2011-05-21 NOTE — Telephone Encounter (Signed)
Called pt and advised to schedule appointment with new PCP. Pt agreed.   Kristina Moreno, Renato Battles

## 2011-05-21 NOTE — Telephone Encounter (Signed)
[facility] and they told her that she is going to need to go to [facility] to ENT - [phone] -  She needs early Monday mornings

## 2011-05-22 ENCOUNTER — Encounter (HOSPITAL_COMMUNITY): Payer: Self-pay | Admitting: *Deleted

## 2011-05-22 ENCOUNTER — Inpatient Hospital Stay (HOSPITAL_COMMUNITY)
Admission: AD | Admit: 2011-05-22 | Discharge: 2011-05-22 | Disposition: A | Discharge status: Home / Self Care | Payer: Medicaid Other | Source: Ambulatory Visit | Attending: Obstetrics and Gynecology | Admitting: Obstetrics and Gynecology

## 2011-05-22 DIAGNOSIS — R350 Frequency of micturition: Secondary | ICD-10-CM | POA: Insufficient documentation

## 2011-05-22 DIAGNOSIS — R109 Unspecified abdominal pain: Secondary | ICD-10-CM | POA: Insufficient documentation

## 2011-05-22 DIAGNOSIS — Z87891 Personal history of nicotine dependence: Secondary | ICD-10-CM | POA: Insufficient documentation

## 2011-05-22 DIAGNOSIS — R319 Hematuria, unspecified: Secondary | ICD-10-CM | POA: Insufficient documentation

## 2011-05-22 DIAGNOSIS — N3090 Cystitis, unspecified without hematuria: Secondary | ICD-10-CM

## 2011-05-22 DIAGNOSIS — R300 Dysuria: Secondary | ICD-10-CM | POA: Insufficient documentation

## 2011-05-22 DIAGNOSIS — N898 Other specified noninflammatory disorders of vagina: Secondary | ICD-10-CM | POA: Insufficient documentation

## 2011-05-22 HISTORY — DX: Meniere's disease, unspecified ear: H81.09

## 2011-05-22 LAB — URINALYSIS, ROUTINE W REFLEX MICROSCOPIC
Bilirubin Urine: NEGATIVE
Glucose, UA: NEGATIVE mg/dL
Ketones, ur: NEGATIVE mg/dL
pH: 5.5 (ref 5.0–8.0)

## 2011-05-22 LAB — WET PREP, GENITAL
Trich, Wet Prep: NONE SEEN
Yeast Wet Prep HPF POC: NONE SEEN

## 2011-05-22 LAB — URINE MICROSCOPIC-ADD ON

## 2011-05-22 MED ORDER — PHENAZOPYRIDINE HCL 200 MG PO TABS: 200.0000 mg | ORAL_TABLET | Freq: Three times a day (TID) | ORAL | Status: AC

## 2011-05-22 MED ORDER — CIPROFLOXACIN HCL 500 MG PO TABS: 500.0000 mg | ORAL_TABLET | Freq: Two times a day (BID) | ORAL | Status: AC

## 2011-05-22 NOTE — Progress Notes (Signed)
Had intercourse on Friday, ? Condom still in vagina, spotting since yesterday, Lower abd pain, difficulty urinating.  Partial hysterectomy 5 yrs ago.

## 2011-05-22 NOTE — ED Provider Notes (Signed)
History     Chief Complaint  Patient presents with  . Abdominal Pain   HPI Kristina Moreno is a 45 y.o. AA who presents to [facility] with c/o dysuria, hematuria and lower abdominal pain. She states that she had sex 4 days ago and the symptoms started after that. She thinks there may still be a condom in her vagina. The patient has had a hysterectomy.    Past Medical History  Diagnosis Date  . Anxiety   . Asthma   . Meniere disease     Past Surgical History  Procedure Date  . Abdominal hysterectomy     No family history on file.  History  Substance Use Topics  . Smoking status: Former Games developer  . Smokeless tobacco: Never Used  . Alcohol Use: Yes     40 oz beer "a couple times a week."    OB History    Grav Para Term Preterm Abortions TAB SAB Ect Mult Living   3 2 2  1 1    2       Review of Systems  Gastrointestinal: Positive for abdominal pain.  Genitourinary: Positive for dysuria, frequency, hematuria and vaginal discharge.  All other systems reviewed and are negative.    Physical Exam  BP 115/71  Pulse 74  Temp(Src) 98.5 F (36.9 C) (Oral)  Resp 18  Ht 5\' 5"  (1.651 m)  Wt 164 lb 3.2 oz (74.481 kg)  BMI 27.32 kg/m2  Physical Exam  Nursing note and vitals reviewed. Constitutional: She is oriented to person, place, and time. She appears well-developed and well-nourished.  HENT:  Head: Normocephalic.  Eyes: EOM are normal.  Neck: Neck supple.  Pulmonary/Chest: Effort normal.  Abdominal: Soft. There is tenderness.         The tenderness is suprapubic.  Genitourinary: There is no lesion on the right labia. There is no lesion on the left labia.       There is a thick white discharge in the vaginal vault. No foreign body identified. Cervix is absent, there is no adnexal tenderness, no bleeding. The uterus is absent.  Musculoskeletal: Normal range of motion.  Neurological: She is alert and oriented to person, place, and time. No cranial nerve deficit.  Skin: Skin  is warm and dry.    ED Course  Procedures  MDM Cath urine shows moderate blood, Tr. LE, TNTC - RBC's and 7-11 WBC's Wet prep is negative  Assessment: Cystitis  Plan: Cipro 500 mg po bid x 5 days           Pyridium 100 mg. Po tid x 3 days           Follow up with PCP, Return here as needed.        [place], [place] 05/22/11 [phone]

## 2011-06-06 ENCOUNTER — Encounter: Payer: Self-pay | Admitting: Family Medicine

## 2011-06-06 ENCOUNTER — Ambulatory Visit (INDEPENDENT_AMBULATORY_CARE_PROVIDER_SITE_OTHER): Payer: Medicaid Other | Admitting: Family Medicine

## 2011-06-06 DIAGNOSIS — H8103 Meniere's disease, bilateral: Secondary | ICD-10-CM

## 2011-06-06 DIAGNOSIS — R42 Dizziness and giddiness: Secondary | ICD-10-CM

## 2011-06-06 DIAGNOSIS — H8109 Meniere's disease, unspecified ear: Secondary | ICD-10-CM

## 2011-06-06 MED ORDER — ALPRAZOLAM 0.5 MG PO TABS: ORAL_TABLET | ORAL | Status: DC

## 2011-06-10 DIAGNOSIS — H8103 Meniere's disease, bilateral: Secondary | ICD-10-CM | POA: Insufficient documentation

## 2011-06-10 NOTE — Progress Notes (Signed)
  Subjective:    Patient ID: Kristina Moreno, female    DOB: Jul 02, 1966, 45 y.o.   MRN: [id number]  HPI Meniere Disease:  Has been going to neurorehabilitation for this problem.  Is having to take meclizine daily to prevent dizziness and feeling of fullness in her ears.  Neuro-rehab has helped some. Suggested she go see ENT by neuro-rehab as the meclizine makes her very sleepy throughout the day, making it difficult for her to work.  She would like to referred to ENT at [facility] to talk about alternatives.  She denies nausea associated with her dizziness, denies hearing loss but does have frequent ringing in her ears.   Review of Systems Per HPI    Objective:   Physical Exam  Constitutional: She appears well-developed and well-nourished. No distress.  HENT:  Right Ear: Hearing, tympanic membrane, external ear and ear canal normal.  Left Ear: Hearing, tympanic membrane, external ear and ear canal normal.          Assessment & Plan:

## 2011-06-10 NOTE — Assessment & Plan Note (Signed)
Still with continued dizziness despite rehab and meclizine makes her very drowsy throughout the day.  Will refer to ENT, for further evaluation and options.

## 2011-06-11 ENCOUNTER — Telehealth: Payer: Self-pay | Admitting: *Deleted

## 2011-06-11 NOTE — Telephone Encounter (Signed)
Left message for pt to call back. Please tell pt: APPT AT [facility] WITH DR.HATCHER (ENT) PH # [phone]. 07-03-11 AT 9:30 AM.  [address] [place] Pt will receive letter in  Mail with appointment information. Kristina Moreno, Renato Battles  Faxed info to Dr.Hatcher.

## 2011-06-12 ENCOUNTER — Telehealth: Payer: Self-pay | Admitting: Family Medicine

## 2011-06-12 NOTE — Telephone Encounter (Signed)
Called pt and informed of the appt with Dr.Hatcher at [facility]. Pt repeated info back to me. I told the pt, that she would be evaluated for Meniere's in their clinic and that Dr.Matthews wanted her to be seen there. Pt agreed and will keep appt. See previous note with appt info. Lorenda Hatchet, Renato Battles

## 2011-06-12 NOTE — Telephone Encounter (Signed)
Needs a statement that she has meneers disease and symptoms it causes- would like to talk to Dr Ashley Royalty

## 2011-06-27 ENCOUNTER — Other Ambulatory Visit: Payer: Self-pay | Admitting: Family Medicine

## 2011-06-27 MED ORDER — MECLIZINE HCL 25 MG PO TABS: 25.0000 mg | ORAL_TABLET | Freq: Three times a day (TID) | ORAL | Status: DC | PRN

## 2011-07-17 ENCOUNTER — Other Ambulatory Visit: Payer: Self-pay | Admitting: Family Medicine

## 2011-07-17 DIAGNOSIS — Z1231 Encounter for screening mammogram for malignant neoplasm of breast: Secondary | ICD-10-CM

## 2011-07-27 ENCOUNTER — Ambulatory Visit (HOSPITAL_COMMUNITY)
Admission: RE | Admit: 2011-07-27 | Discharge: 2011-07-27 | Disposition: A | Discharge status: Home / Self Care | Payer: Medicaid Other | Source: Ambulatory Visit | Attending: Family Medicine | Admitting: Family Medicine

## 2011-07-27 DIAGNOSIS — Z1231 Encounter for screening mammogram for malignant neoplasm of breast: Secondary | ICD-10-CM

## 2011-08-08 ENCOUNTER — Telehealth: Payer: Self-pay | Admitting: Family Medicine

## 2011-08-08 DIAGNOSIS — F410 Panic disorder [episodic paroxysmal anxiety] without agoraphobia: Secondary | ICD-10-CM

## 2011-08-08 MED ORDER — ALPRAZOLAM 0.5 MG PO TABS: ORAL_TABLET | ORAL | Status: DC

## 2011-08-08 NOTE — Telephone Encounter (Signed)
Paged Dr. Ashley Royalty and he states he will refill for patient since last time he gave her rx was 06/06/2011. RX called to pharmacy. Message left on patient's voicemail.

## 2011-08-08 NOTE — Telephone Encounter (Signed)
Will forward to Dr Matthews 

## 2011-08-08 NOTE — Telephone Encounter (Signed)
Pt has lost her xanax and has looked for 2 days and doesn't know what to do.  She is getting very anxious about it and wants to know if she can have more called in.  Needs today if possible. Sharl Ma- [place]

## 2011-08-15 ENCOUNTER — Ambulatory Visit (INDEPENDENT_AMBULATORY_CARE_PROVIDER_SITE_OTHER): Payer: Medicaid Other

## 2011-08-15 DIAGNOSIS — Z23 Encounter for immunization: Secondary | ICD-10-CM

## 2011-09-05 ENCOUNTER — Telehealth: Payer: Self-pay | Admitting: Family Medicine

## 2011-09-05 NOTE — Telephone Encounter (Signed)
Ms. Emeline Darling called back with name and address of atty she need to have records sent to.  Judithann Graves, Atty [address] [place], [place] [address] Ph# [phone]

## 2011-09-05 NOTE — Telephone Encounter (Signed)
Patient asking for a letter stating she is taking meclizine for meniere's disease

## 2011-09-05 NOTE — Telephone Encounter (Signed)
Will forward to Dr Matthews 

## 2011-09-20 NOTE — Telephone Encounter (Signed)
Letter completed, and sent to admin to be mailed to Atty.

## 2011-10-18 ENCOUNTER — Telehealth: Payer: Self-pay | Admitting: Family Medicine

## 2011-10-18 NOTE — Telephone Encounter (Signed)
Wants to know where she went for her Menere's disease in [place] - thinks his name is Dr Ninetta Lights.

## 2011-10-18 NOTE — Telephone Encounter (Signed)
Called pt: Dr.Hatcher Ph # [phone]. [place]. Pt had appt 07-03-11 at 9:30 am and missed appointment. She did not cancel appt, just was unable to keep it. I told the pt to call them and explain the situation and r/s it. Pt agreed. Fwd. To PCP for info. Kristina Moreno, Kristina Moreno

## 2011-10-19 ENCOUNTER — Telehealth: Payer: Self-pay | Admitting: Family Medicine

## 2011-10-19 DIAGNOSIS — R42 Dizziness and giddiness: Secondary | ICD-10-CM

## 2011-10-19 NOTE — Telephone Encounter (Signed)
Needing a referral to go back to [facility], Dr. Ninetta Lights for Meneirs Disease study.

## 2011-10-24 NOTE — Telephone Encounter (Signed)
Please see previous message. Kristina Moreno, Kristina Moreno

## 2011-10-31 NOTE — Telephone Encounter (Signed)
Addended by: Everrett Coombe on: 10/31/2011 11:54 AM   Modules accepted: Orders

## 2011-10-31 NOTE — Telephone Encounter (Addendum)
Completed and routed to Red Team.  Missed last appt. Unsure if the will accept her again.

## 2011-11-01 ENCOUNTER — Telehealth: Payer: Self-pay | Admitting: Family Medicine

## 2011-11-01 NOTE — Telephone Encounter (Signed)
[name] is calling about a referral to [facility] for an ENT Referral.  She said that they will not accept anything but a call.  The number is [phone] choose option 1.

## 2011-11-01 NOTE — Telephone Encounter (Signed)
Called patient and told her I would call her back once I had the appointment with [facility]. Told her it would be sometime next week. She no showed her last appointment.Busick, Rodena Medin

## 2011-11-05 ENCOUNTER — Telehealth: Payer: Self-pay | Admitting: *Deleted

## 2011-11-05 NOTE — Telephone Encounter (Signed)
Called and informed patient of appointment with [facility] for Meniere's on 11/27/11 at 10:30. They are mailing information to patient's house.Busick, Rodena Medin

## 2012-01-10 ENCOUNTER — Inpatient Hospital Stay (HOSPITAL_COMMUNITY)
Admission: AD | Admit: 2012-01-10 | Discharge: 2012-01-10 | Disposition: A | Discharge status: Home / Self Care | Payer: Medicaid Other | Source: Ambulatory Visit | Attending: Obstetrics and Gynecology | Admitting: Obstetrics and Gynecology

## 2012-01-10 ENCOUNTER — Encounter (HOSPITAL_COMMUNITY): Payer: Self-pay

## 2012-01-10 DIAGNOSIS — N760 Acute vaginitis: Secondary | ICD-10-CM

## 2012-01-10 DIAGNOSIS — N949 Unspecified condition associated with female genital organs and menstrual cycle: Secondary | ICD-10-CM | POA: Insufficient documentation

## 2012-01-10 DIAGNOSIS — B9689 Other specified bacterial agents as the cause of diseases classified elsewhere: Secondary | ICD-10-CM | POA: Insufficient documentation

## 2012-01-10 DIAGNOSIS — A499 Bacterial infection, unspecified: Secondary | ICD-10-CM | POA: Insufficient documentation

## 2012-01-10 HISTORY — DX: Chronic obstructive pulmonary disease, unspecified: J44.9

## 2012-01-10 LAB — URINALYSIS, ROUTINE W REFLEX MICROSCOPIC
Bilirubin Urine: NEGATIVE
Leukocytes, UA: NEGATIVE
Nitrite: NEGATIVE
Specific Gravity, Urine: >1.03 — ABNORMAL HIGH (ref 1.005–1.030)
Urobilinogen, UA: 0.2 mg/dL (ref 0.0–1.0)

## 2012-01-10 LAB — WET PREP, GENITAL: Trich, Wet Prep: NONE SEEN

## 2012-01-10 LAB — POCT PREGNANCY, URINE: Preg Test, Ur: NEGATIVE

## 2012-01-10 MED ORDER — METRONIDAZOLE 500 MG PO TABS: 500.0000 mg | ORAL_TABLET | Freq: Two times a day (BID) | ORAL | Status: AC

## 2012-01-10 NOTE — MAU Note (Signed)
Pt states intercourse with new partner 2 weeks ago. 4 days after noted piece of condom from vagina. Notes white vaginal d/c, odorous, mild internal itching. Partial hysterectomy, no menses x6years.

## 2012-01-10 NOTE — Discharge Instructions (Signed)
Bacterial Vaginosis Bacterial vaginosis (BV) is a vaginal infection where the normal balance of bacteria in the vagina is disrupted. The normal balance is then replaced by an overgrowth of certain bacteria. There are several different kinds of bacteria that can cause BV. BV is the most common vaginal infection in women of childbearing age. CAUSES   The cause of BV is not fully understood. BV develops when there is an increase or imbalance of harmful bacteria.   Some activities or behaviors can upset the normal balance of bacteria in the vagina and put women at increased risk including:   Having a new sex partner or multiple sex partners.   Douching.   Using an intrauterine device (IUD) for contraception.   It is not clear what role sexual activity plays in the development of BV. However, women that have never had sexual intercourse are rarely infected with BV.  Women do not get BV from toilet seats, bedding, swimming pools or from touching objects around them.  SYMPTOMS   Grey vaginal discharge.   A fish-like odor with discharge, especially after sexual intercourse.   Itching or burning of the vagina and vulva.   Burning or pain with urination.   Some women have no signs or symptoms at all.  DIAGNOSIS  Your caregiver must examine the vagina for signs of BV. Your caregiver will perform lab tests and look at the sample of vaginal fluid through a microscope. They will look for bacteria and abnormal cells (clue cells), a pH test higher than 4.5, and a positive amine test all associated with BV.  RISKS AND COMPLICATIONS   Pelvic inflammatory disease (PID).   Infections following gynecology surgery.   Developing HIV.   Developing herpes virus.  TREATMENT  Sometimes BV will clear up without treatment. However, all women with symptoms of BV should be treated to avoid complications, especially if gynecology surgery is planned. Female partners generally do not need to be treated. However,  BV may spread between female sex partners so treatment is helpful in preventing a recurrence of BV.   BV may be treated with antibiotics. The antibiotics come in either pill or vaginal cream forms. Either can be used with nonpregnant or pregnant women, but the recommended dosages differ. These antibiotics are not harmful to the baby.   BV can recur after treatment. If this happens, a second round of antibiotics will often be prescribed.   Treatment is important for pregnant women. If not treated, BV can cause a premature delivery, especially for a pregnant woman who had a premature birth in the past. All pregnant women who have symptoms of BV should be checked and treated.   For chronic reoccurrence of BV, treatment with a type of prescribed gel vaginally twice a week is helpful.  HOME CARE INSTRUCTIONS   Finish all medication as directed by your caregiver.   Do not have sex until treatment is completed.   Tell your sexual partner that you have a vaginal infection. They should see their caregiver and be treated if they have problems, such as a mild rash or itching.   Practice safe sex. Use condoms. Only have 1 sex partner.  PREVENTION  Basic prevention steps can help reduce the risk of upsetting the natural balance of bacteria in the vagina and developing BV:  Do not have sexual intercourse (be abstinent).   Do not douche.   Use all of the medicine prescribed for treatment of BV, even if the signs and symptoms go away.     Tell your sex partner if you have BV. That way, they can be treated, if needed, to prevent reoccurrence.  SEEK MEDICAL CARE IF:   Your symptoms are not improving after 3 days of treatment.   You have increased discharge, pain, or fever.  MAKE SURE YOU:   Understand these instructions.   Will watch your condition.   Will get help right away if you are not doing well or get worse.  FOR MORE INFORMATION  Division of STD Prevention (DSTDP), Centers for Disease  Control and Prevention: www.cdc.gov/std American Social Health Association (ASHA): www.ashastd.org  Document Released: 10/08/2005 Document Revised: 09/27/2011 Document Reviewed: 03/31/2009 ExitCare Patient Information 2012 [employer]. 

## 2012-01-10 NOTE — MAU Provider Note (Signed)
History     CSN: [id number]  Arrival date and time: 01/10/12 0840   First Provider Initiated Contact with Patient 01/10/12 0901      Chief Complaint  Patient presents with  . Vaginal Discharge   Vaginal Discharge The patient's primary symptoms include a vaginal discharge. Associated symptoms include abdominal pain. Pertinent negatives include no chills, constipation, diarrhea (very mild interrmittent dull pain RLQ), dysuria, fever, flank pain, frequency, headaches, hematuria, nausea, rash, sore throat, urgency or vomiting.    Patient reports new sexual partner approximately 2 weeks ago.  States she found a piece of condom in her vaginal 3 days after intercourse.  Now with 1 week history of clear thick vaginal discharge, mild vaginal itching, change in odor and very mild intermittent RLQ pain.  Denies nausea, vomiting, diarrhea or fever. Last bowel movement this morning.  Pertinent Gynecological History:    Past Medical History  Diagnosis Date  . Anxiety   . Asthma   . Meniere disease   . COPD (chronic obstructive pulmonary disease)     Past Surgical History  Procedure Date  . Abdominal hysterectomy     Family History  Problem Relation Age of Onset  . Anesthesia problems Neg Hx   . Hypotension Neg Hx   . Malignant hyperthermia Neg Hx   . Pseudochol deficiency Neg Hx     History  Substance Use Topics  . Smoking status: Current Some Day Smoker  . Smokeless tobacco: Never Used  . Alcohol Use: Yes     40 oz beer "a couple times a week."    Allergies: No Known Allergies  Prescriptions prior to admission  Medication Sig Dispense Refill  . albuterol (PROVENTIL HFA;VENTOLIN HFA) 108 (90 BASE) MCG/ACT inhaler Inhale 2 puffs into the lungs every 6 (six) hours as needed. For shortness of breath/asthma      . ALPRAZolam (XANAX) 0.5 MG tablet Take 0.25 mg by mouth 3 (three) times daily as needed. For anxiety      . hydrochlorothiazide (MICROZIDE) 12.5 MG capsule Take 12.5  mg by mouth daily.      Marland Kitchen OVER THE COUNTER MEDICATION Take 1 tablet by mouth every morning. Over the counter weight loss supplement        Review of Systems  Constitutional: Negative for fever, chills, weight loss, malaise/fatigue and diaphoresis.  HENT: Negative for sore throat.   Eyes: Negative for double vision, photophobia and pain.  Respiratory: Negative for cough, hemoptysis, shortness of breath, wheezing and stridor.   Cardiovascular: Negative for chest pain, palpitations and orthopnea.  Gastrointestinal: Positive for abdominal pain. Negative for nausea, vomiting, diarrhea (very mild interrmittent dull pain RLQ), constipation and blood in stool.  Genitourinary: Positive for vaginal discharge. Negative for dysuria, urgency, frequency, hematuria and flank pain.  Skin: Negative for itching and rash.  Neurological: Negative for dizziness, tingling, tremors, sensory change, speech change, focal weakness, seizures, loss of consciousness, weakness and headaches.  Psychiatric/Behavioral: Positive for substance abuse (occaisionally smokes marijuana for anxiety). Negative for depression, suicidal ideas and memory loss. The patient does not have insomnia.    Physical Exam   Blood pressure 123/88, pulse 73, temperature 97.7 F (36.5 C), temperature source Oral, resp. rate 16, height 5\' 5"  (1.651 m), weight 78.699 kg (173 lb 8 oz), SpO2 100.00%.  Physical Exam  Constitutional: She is oriented to person, place, and time. She appears well-developed and well-nourished.  HENT:  Head: Normocephalic and atraumatic.  Eyes: Pupils are equal, round, and reactive to light.  Neck: Normal range of motion. Neck supple. No JVD present. No tracheal deviation present. No thyromegaly present.  Cardiovascular: Normal rate, regular rhythm, normal heart sounds and intact distal pulses.  Exam reveals no gallop and no friction rub.   No murmur heard. Respiratory: Effort normal and breath sounds normal. No stridor.    GI: Soft. She exhibits no distension and no mass. There is no tenderness. There is no rebound and no guarding.  Genitourinary: Right adnexum displays no mass, no tenderness and no fullness. Left adnexum displays no mass, no tenderness and no fullness. Vaginal discharge (moderate amount white thick discharge) found.  Musculoskeletal: Normal range of motion.  Neurological: She is alert and oriented to person, place, and time.  Skin: Skin is warm and dry.  Psychiatric: She has a normal mood and affect. Her behavior is normal. Judgment and thought content normal.    MAU Course  Procedures Pelvic Exam Vaginal Cultures UA MDM  Assessment and Plan  UA negative Wet Prep positive for clue cells Treat for bacterial vaginitis Flagyl 500mg  BID x 7days  Loraine Grip 01/10/2012, 9:38 AM   I was present for the exam and agree with the assessment and plan.  Clinton Gallant. Yoanna Jurczyk III, DrHSc, MPAS, PA-C

## 2012-01-11 LAB — GC/CHLAMYDIA PROBE AMP, GENITAL: Chlamydia, DNA Probe: NEGATIVE

## 2012-01-12 NOTE — MAU Provider Note (Signed)
Agree with above note.  Kristina Moreno 01/12/2012 7:06 AM

## 2012-04-04 ENCOUNTER — Other Ambulatory Visit: Payer: Self-pay | Admitting: Family Medicine

## 2012-04-04 ENCOUNTER — Telehealth: Payer: Self-pay | Admitting: *Deleted

## 2012-04-04 MED ORDER — ALPRAZOLAM 0.5 MG PO TABS: 0.2500 mg | ORAL_TABLET | Freq: Three times a day (TID) | ORAL | Status: DC | PRN

## 2012-04-04 NOTE — Progress Notes (Signed)
Out of xanax, will give two week supply until she is seen in clinic

## 2012-04-04 NOTE — Telephone Encounter (Signed)
Called patient she will be here in 20 minutes to pick up Rx.Meghanne Pletz, Rodena Medin

## 2012-04-10 ENCOUNTER — Encounter (HOSPITAL_COMMUNITY): Payer: Self-pay | Admitting: *Deleted

## 2012-04-10 ENCOUNTER — Emergency Department (INDEPENDENT_AMBULATORY_CARE_PROVIDER_SITE_OTHER)
Admission: EM | Admit: 2012-04-10 | Discharge: 2012-04-10 | Disposition: A | Discharge status: Home / Self Care | Payer: Medicaid Other | Source: Home / Self Care | Attending: Family Medicine | Admitting: Family Medicine

## 2012-04-10 DIAGNOSIS — T6391XA Toxic effect of contact with unspecified venomous animal, accidental (unintentional), initial encounter: Secondary | ICD-10-CM

## 2012-04-10 DIAGNOSIS — T63481A Toxic effect of venom of other arthropod, accidental (unintentional), initial encounter: Secondary | ICD-10-CM

## 2012-04-10 MED ORDER — FLUTICASONE PROPIONATE 0.05 % EX CREA: TOPICAL_CREAM | Freq: Two times a day (BID) | CUTANEOUS | Status: DC

## 2012-04-10 MED ORDER — SULFAMETHOXAZOLE-TRIMETHOPRIM 800-160 MG PO TABS: 1.0000 | ORAL_TABLET | Freq: Two times a day (BID) | ORAL | Status: AC

## 2012-04-10 NOTE — ED Provider Notes (Signed)
History     CSN: [id number]  Arrival date & time 04/10/12  1348   First MD Initiated Contact with Patient 04/10/12 1525      Chief Complaint  Patient presents with  . Insect Bite    (Consider location/radiation/quality/duration/timing/severity/associated sxs/prior treatment) HPI Comments: The patient states she was bitten by an insect on the lateral upper right thigh 2 days ago. Now with swelling, induration, pain and itching. Has few other bits as well. She states she done not have mrsa but her entire family does. No fever.   The history is provided by the patient.    Past Medical History  Diagnosis Date  . Anxiety   . Asthma   . Meniere disease   . COPD (chronic obstructive pulmonary disease)     Past Surgical History  Procedure Date  . Abdominal hysterectomy     Family History  Problem Relation Age of Onset  . Anesthesia problems Neg Hx   . Hypotension Neg Hx   . Malignant hyperthermia Neg Hx   . Pseudochol deficiency Neg Hx     History  Substance Use Topics  . Smoking status: Current Some Day Smoker  . Smokeless tobacco: Never Used  . Alcohol Use: Yes     40 oz beer "a couple times a week."    OB History    Grav Para Term Preterm Abortions TAB SAB Ect Mult Living   3 2 2  1 1    2       Review of Systems  Constitutional: Negative.   HENT: Negative.   Respiratory: Negative.   Cardiovascular: Negative.   Gastrointestinal: Negative.   Genitourinary: Negative.     Allergies  Review of patient's allergies indicates no known allergies.  Home Medications   Current Outpatient Rx  Name Route Sig Dispense Refill  . ALBUTEROL SULFATE HFA 108 (90 BASE) MCG/ACT IN AERS Inhalation Inhale 2 puffs into the lungs every 6 (six) hours as needed. For shortness of breath/asthma    . ALPRAZOLAM 0.5 MG PO TABS Oral Take 0.5 tablets (0.25 mg total) by mouth 3 (three) times daily as needed. For anxiety 21 tablet 0  . FLUTICASONE PROPIONATE 0.05 % EX CREA Topical  Apply topically 2 (two) times daily. 30 g 0  . HYDROCHLOROTHIAZIDE 12.5 MG PO CAPS Oral Take 12.5 mg by mouth daily.    Marland Kitchen OVER THE COUNTER MEDICATION Oral Take 1 tablet by mouth every morning. Over the counter weight loss supplement    . SULFAMETHOXAZOLE-TRIMETHOPRIM 800-160 MG PO TABS Oral Take 1 tablet by mouth every 12 (twelve) hours. 20 tablet 0    BP 119/67  Pulse 64  Temp 98 F (36.7 C) (Oral)  Resp 20  SpO2 100%  Physical Exam  Nursing note and vitals reviewed. Constitutional: She appears well-developed and well-nourished. No distress.  HENT:  Head: Normocephalic and atraumatic.  Neck: Normal range of motion. Neck supple.  Cardiovascular: Normal rate.   Pulmonary/Chest: Effort normal and breath sounds normal.  Skin: Skin is warm and dry.       Large 5 cm in diameter area of induration with erythema in even a larger area. Tender to touch. Do not think this is an abscess. Has vesicle centrally . Another lesion noted distal to the first.     ED Course  Procedures (including critical care time)  Labs Reviewed - No data to display No results found.   1. Insect sting       MDM  Randa Spike, MD 04/10/12 [phone]

## 2012-04-10 NOTE — ED Notes (Signed)
Pt is here with complaints of insect bite to right outer thigh.  Area is red, raised and tender.

## 2012-04-10 NOTE — Discharge Instructions (Signed)
Apply cool compresses. Follow up with your pcp if symptoms persist or worsen, or if fever developes

## 2012-04-14 ENCOUNTER — Encounter: Payer: Self-pay | Admitting: Family Medicine

## 2012-04-14 ENCOUNTER — Ambulatory Visit (INDEPENDENT_AMBULATORY_CARE_PROVIDER_SITE_OTHER): Payer: Medicaid Other | Admitting: Family Medicine

## 2012-04-14 VITALS — BP 115/64 | HR 67 | Ht 65.0 in | Wt 166.0 lb

## 2012-04-14 DIAGNOSIS — F410 Panic disorder [episodic paroxysmal anxiety] without agoraphobia: Secondary | ICD-10-CM

## 2012-04-14 DIAGNOSIS — F1210 Cannabis abuse, uncomplicated: Secondary | ICD-10-CM

## 2012-04-14 MED ORDER — ALPRAZOLAM 0.5 MG PO TABS: 0.2500 mg | ORAL_TABLET | Freq: Three times a day (TID) | ORAL | Status: DC | PRN

## 2012-04-14 NOTE — Patient Instructions (Addendum)
Thank you for coming in today, it was good to see you I would like for you to schedule an physical in the next three months. Try to cut back on your marijuana use if you can, this will eventually lead to lung disease if you continue.

## 2012-04-22 NOTE — Progress Notes (Signed)
  Subjective:    Patient ID: Kristina Moreno, female    DOB: Nov 15, 1965, 46 y.o.   MRN: [id number]  HPI  1.  Anxiety/Panic disorder:  Has been on alprazolam for this as needed for a few years.  Feels like medication is not working as well as it once was and is getting more breakthrough panic symptoms.  She is also using marijuana, smoking "2 blunts" every day.  She has no plans to quit this.  Questions whether she can increase xanax to 0.5mg  tid.    Review of Systems Denies shortness of breath, chest pain, thoughts of hurting herself    Objective:   Physical Exam  Constitutional: She is oriented to person, place, and time. No distress.  HENT:  Head: Normocephalic.  Cardiovascular: Normal rate and regular rhythm.   Pulmonary/Chest: Effort normal and breath sounds normal.  Musculoskeletal: She exhibits no edema.  Neurological: She is alert and oriented to person, place, and time.          Assessment & Plan:

## 2012-04-22 NOTE — Assessment & Plan Note (Signed)
Will refill xanax, however I explained to her that i would not increase her dosage due to her continued marijuana usage.

## 2012-04-22 NOTE — Assessment & Plan Note (Signed)
Counseled on the long term effects of this.  Recommended quitting, at the very least cutting back.

## 2012-04-30 ENCOUNTER — Inpatient Hospital Stay (HOSPITAL_COMMUNITY)
Admission: AD | Admit: 2012-04-30 | Discharge: 2012-04-30 | Disposition: A | Discharge status: Home / Self Care | Payer: Medicaid Other | Source: Ambulatory Visit | Attending: Family Medicine | Admitting: Family Medicine

## 2012-04-30 ENCOUNTER — Encounter (HOSPITAL_COMMUNITY): Payer: Self-pay | Admitting: *Deleted

## 2012-04-30 DIAGNOSIS — R5383 Other fatigue: Secondary | ICD-10-CM | POA: Insufficient documentation

## 2012-04-30 DIAGNOSIS — N949 Unspecified condition associated with female genital organs and menstrual cycle: Secondary | ICD-10-CM | POA: Insufficient documentation

## 2012-04-30 DIAGNOSIS — R5381 Other malaise: Secondary | ICD-10-CM | POA: Insufficient documentation

## 2012-04-30 DIAGNOSIS — A499 Bacterial infection, unspecified: Secondary | ICD-10-CM | POA: Insufficient documentation

## 2012-04-30 DIAGNOSIS — B9689 Other specified bacterial agents as the cause of diseases classified elsewhere: Secondary | ICD-10-CM

## 2012-04-30 DIAGNOSIS — B373 Candidiasis of vulva and vagina: Secondary | ICD-10-CM

## 2012-04-30 DIAGNOSIS — N760 Acute vaginitis: Secondary | ICD-10-CM

## 2012-04-30 LAB — CBC
MCH: 26.8 pg (ref 26.0–34.0)
MCV: 83.3 fL (ref 78.0–100.0)
Platelets: 240 10*3/uL (ref 150–400)
RDW: 14.2 % (ref 11.5–15.5)
WBC: 4.1 10*3/uL (ref 4.0–10.5)

## 2012-04-30 LAB — WET PREP, GENITAL: WBC, Wet Prep HPF POC: NONE SEEN

## 2012-04-30 MED ORDER — FLUCONAZOLE 150 MG PO TABS: 150.0000 mg | ORAL_TABLET | Freq: Once | ORAL | Status: AC

## 2012-04-30 MED ORDER — METRONIDAZOLE 500 MG PO TABS: 500.0000 mg | ORAL_TABLET | Freq: Two times a day (BID) | ORAL | Status: AC

## 2012-04-30 NOTE — MAU Provider Note (Signed)
History     CSN: [id number]  Arrival date and time: 04/30/12 7846   First Provider Initiated Contact with Patient 04/30/12 [phone]      Chief Complaint  Patient presents with  . Vaginal Discharge   HPI  46 yo female s/p hysterectomy with partial cervix who presents with 2-3 weeks of thick, white, odorous vaginal discharge. She has also had vaginal itching. She has one new female partner and uses condoms but has had unprotected oral sex. She is not aware of her partner's STI status. She has been recently treated with Septra for infected mosquito bites. She denies fever or chills, no nausea, vomiting, or abdominal pain. No urinary symptoms.  Pt also endorses 1 month of fatigue. She has been dieting--consuming approximately 1200 calories per day. She reports occasional dizziness, but also has history of Meniere's disease for which she takes HCTZ.  Pertinent Gynecological History: Menses: post-menopausal 2/2 hysterectomy with partial cervix 6 years ago Contraception: condoms; pt also plans to use dental dam in future for oral sex as she feels this is likely the cause of her discharge currently.  Sexually transmitted diseases: no past history Previous GYN Procedures: hysterectomy    Past Medical History  Diagnosis Date  . Anxiety   . Asthma   . Meniere disease   . COPD (chronic obstructive pulmonary disease)     Past Surgical History  Procedure Date  . Abdominal hysterectomy     Family History  Problem Relation Age of Onset  . Anesthesia problems Neg Hx   . Hypotension Neg Hx   . Malignant hyperthermia Neg Hx   . Pseudochol deficiency Neg Hx     History  Substance Use Topics  . Smoking status: Current Some Day Smoker  . Smokeless tobacco: Never Used  . Alcohol Use: Yes     40 oz beer "a couple times a week."  Current everyday marijuana user  Allergies: No Known Allergies  Prescriptions prior to admission  Medication Sig Dispense Refill  . albuterol (PROVENTIL  HFA;VENTOLIN HFA) 108 (90 BASE) MCG/ACT inhaler Inhale 2 puffs into the lungs every 6 (six) hours as needed. For shortness of breath/asthma      . ALPRAZolam (XANAX) 0.5 MG tablet Take 0.5 tablets (0.25 mg total) by mouth 3 (three) times daily as needed. For anxiety  45 tablet  3  . fluticasone (CUTIVATE) 0.05 % cream Apply topically 2 (two) times daily.  30 g  0  . hydrochlorothiazide (MICROZIDE) 12.5 MG capsule Take 12.5 mg by mouth daily.        Review of Systems  Constitutional: Positive for malaise/fatigue. Negative for fever and chills.  HENT: Negative for sore throat.   Respiratory: Negative for cough and shortness of breath.   Cardiovascular: Negative for chest pain, palpitations and leg swelling.  Gastrointestinal: Negative for nausea, vomiting, abdominal pain, diarrhea, constipation, blood in stool and melena.  Genitourinary: Negative for dysuria, urgency, frequency, hematuria and flank pain.  Skin:       Recurrent boils external genitalia and axilla  Neurological: Positive for dizziness. Negative for weakness and headaches.   Physical Exam   Blood pressure 106/64, pulse 79, temperature 98.6 F (37 C), temperature source Oral, resp. rate 18.  Physical Exam  Constitutional: She is oriented to person, place, and time. She appears well-developed. No distress.  Eyes: Pupils are equal, round, and reactive to light.  Neck: Normal range of motion.  Cardiovascular: Normal rate, regular rhythm and intact distal pulses.  Exam reveals no  gallop and no friction rub.   No murmur heard. Respiratory: Effort normal and breath sounds normal. No respiratory distress.  GI: Soft. Bowel sounds are normal. She exhibits no distension and no mass. There is no tenderness. There is no rebound and no guarding.  Genitourinary: There is no rash, tenderness or lesion on the right labia. There is no rash, tenderness or lesion on the left labia. Cervix exhibits discharge. Cervix exhibits no motion tenderness  and no friability. Right adnexum displays no mass and no tenderness. Left adnexum displays no mass and no tenderness. No tenderness or bleeding around the vagina. Vaginal discharge found.  Musculoskeletal: Normal range of motion.  Neurological: She is alert and oriented to person, place, and time.  Skin: Skin is warm and dry.       Pustule left mons pubis; pt expelled white pus with squeezing during exam; no surrounding erythema or tenderness to palpation  Psychiatric: She has a normal mood and affect. Her behavior is normal. Judgment and thought content normal.    MAU Course  Procedures SVE  MDM  Initial differential (vaginal d/c): STI vs yeast   SVE with wetprep G/C  Microscopic wet-mount exam shows clue cells.  Initial differential (fatigue): diet-related vs symptomatic anemia  CBC    Component Value Date/Time   WBC 4.1 04/30/2012 1003   RBC 5.22* 04/30/2012 1003   HGB 14.0 04/30/2012 1003   HCT 43.5 04/30/2012 1003   PLT 240 04/30/2012 1003   MCV 83.3 04/30/2012 1003   MCH 26.8 04/30/2012 1003   MCHC 32.2 04/30/2012 1003   RDW 14.2 04/30/2012 1003    Assessment and Plan  1. Bacteria vaginosis, yeast 2. Fatigue likely secondary to diet-restriction  1. Metronidazole 500 mg bid X 7 days; counseled pt to avoid alcohol and complete entire antibiotic course. Though no yeast on wet prep, will treat with Diflucan 150 mg now and one in 48 hours given itching and hx of recent antibiotic use. 2. Provided education on a healthy diet and encouraged her to make healthy choices including fruits and vegetables and baked foods but also to make sure that she does not limit her calories to the point where she has low energy. Assured pt that blood counts are normal.   Latina Craver, PA-S 04/30/2012, 9:43 AM   I have seen this patient and agree with the above PA student's note.  Discussed availability of further STI testing at [facility] with pt.    LEFTWICH-KIRBY, Mayeli Bornhorst Certified  Nurse-Midwife

## 2012-04-30 NOTE — MAU Note (Signed)
Pt presents for vaginal discharge with odor started noticing it a couple weeks ago. Denies any bleeding. States she uses condoms with partner

## 2012-05-05 NOTE — MAU Provider Note (Signed)
Chart reviewed and agree with management and plan.  

## 2012-05-15 ENCOUNTER — Ambulatory Visit (INDEPENDENT_AMBULATORY_CARE_PROVIDER_SITE_OTHER): Payer: Medicaid Other | Admitting: Family Medicine

## 2012-05-15 ENCOUNTER — Encounter: Payer: Self-pay | Admitting: Family Medicine

## 2012-05-15 VITALS — BP 103/70 | HR 76 | Ht 65.0 in | Wt 156.0 lb

## 2012-05-15 DIAGNOSIS — IMO0002 Reserved for concepts with insufficient information to code with codable children: Secondary | ICD-10-CM

## 2012-05-15 DIAGNOSIS — L02411 Cutaneous abscess of right axilla: Secondary | ICD-10-CM

## 2012-05-15 MED ORDER — DOXYCYCLINE HYCLATE 100 MG PO TABS: 100.0000 mg | ORAL_TABLET | Freq: Two times a day (BID) | ORAL | Status: DC

## 2012-05-15 NOTE — Patient Instructions (Signed)
It is ok to bathe and keep the area clean. Return on Monday or Tuesday to have the area rechecked. If the redness extends beyond that line after being on antibiotics for 24 hours go to urgent care or ER.  Incision and Drainage Incision and drainage (I&D) is a procedure in which a cavity-like structure (cystic structure) is opened and drained. The cyst to be drained usually contains material such as pus, fluid, or blood. Gauze is sometimes packed into the cut (incision). Keeping a drain or piece of gauze in the incision keeps the skin from healing first. This helps stop the cyst from forming again. HOME CARE INSTRUCTIONS   Only take over-the-counter or prescription medicines for pain, discomfort, or fever as directed by your caregiver. Use these only if your caregiver has not given medicines that would interfere.   See your caregiver as directed for a recheck.   If medicines (antibiotics) that kill germs were prescribed, take them as directed.  SEEK MEDICAL CARE IF:   You develop increased pain, swelling, redness, drainage, or bleeding in the wound.   You develop signs of an infection. These signs include muscle aches, chills, or a general ill feeling.   You have a fever.  MAKE SURE YOU:   Understand these instructions.   Will watch your condition.   Will get help right away if you are not doing well or get worse.  Document Released: 04/03/2001 Document Revised: 09/27/2011 Document Reviewed: 05/28/2008 [facility] Patient Information 2012 [place], [place].

## 2012-05-18 LAB — WOUND CULTURE

## 2012-05-18 NOTE — Progress Notes (Signed)
  Subjective:    Patient ID: Kristina Moreno, female    DOB: [birth year], 46 y.o.   MRN: [id number]  HPI 1.  Abscess:  Patient with complaint of abscess in underarm.  Present x3-4 days.  Some spontaneous drainage.  Has been using warm compresses without much relief.  Ibuprofen helps some.  Has had this multiple times in the past.  Currently on metronidazole for BV.  Denies fever, chills, nausea or vomiting, pain/swelling into arm.    Review of Systems Per HPI    Objective:   Physical Exam  Constitutional:       AAF, No distress  Neck: Neck supple.  Lymphadenopathy:    She has no cervical adenopathy.  Skin:       Fluctuant, tender area ~3cm in length.  There is some drainage towards the end of the abscess.  There is redness extending 1/3 of the inside portion of the upper arm.    Incision and Drainage Procedure Note  Pre-operative Diagnosis: Axillary absccess  Post-operative Diagnosis: same  Indications: Drainage of abscess  Anesthesia: 1% lidocaine with epinephrine  Procedure Details  The procedure, risks and complications have been discussed in detail (including, but not limited to airway compromise, infection, bleeding) with the patient, and the patient has signed consent to the procedure.  The skin was sterilely prepped and draped over the affected area in the usual fashion. After adequate local anesthesia, I&D with a #11 blade was performed on the right axilla. Purulent drainage: present The patient was observed until stable.  Findings: Abscess, purulent drainage sent for culture  EBL: <5 cc's  Drains: n/a  Condition: Tolerated procedure well   Complications: none.         Assessment & Plan:

## 2012-05-18 NOTE — Assessment & Plan Note (Addendum)
I&D performed, pocket not deep enough for packing.  Placed on doxycycline for empiric coverage for possible MRSA.  Fluid sent for culture. F/u early next week.

## 2012-05-19 ENCOUNTER — Telehealth: Payer: Self-pay | Admitting: Family Medicine

## 2012-05-19 MED ORDER — SULFAMETHOXAZOLE-TRIMETHOPRIM 800-160 MG PO TABS: 1.0000 | ORAL_TABLET | Freq: Two times a day (BID) | ORAL | Status: AC

## 2012-05-19 NOTE — Telephone Encounter (Signed)
Wound culture with proteus mirabilis.  Changed to Septra for better gram negative coverage.  Informed patient.

## 2012-05-27 ENCOUNTER — Telehealth: Payer: Self-pay | Admitting: Family Medicine

## 2012-05-27 MED ORDER — FLUCONAZOLE 150 MG PO TABS: 150.0000 mg | ORAL_TABLET | Freq: Once | ORAL | Status: AC

## 2012-05-27 NOTE — Telephone Encounter (Signed)
Sent in Diflucan

## 2012-05-27 NOTE — Telephone Encounter (Signed)
Forward to Dr Ashley Royalty for Rx, send to [facility] [address].Leland Raver, Rodena Medin

## 2012-05-27 NOTE — Telephone Encounter (Signed)
Was given ABX last week and Dr Ashley Royalty told her that if she got a yeast inf, that he would call something in for her.  She prefers the pill.  Kristina Moreno- [name]

## 2012-07-01 ENCOUNTER — Other Ambulatory Visit: Payer: Self-pay | Admitting: Family Medicine

## 2012-07-01 ENCOUNTER — Telehealth: Payer: Self-pay | Admitting: Family Medicine

## 2012-07-01 DIAGNOSIS — Z1231 Encounter for screening mammogram for malignant neoplasm of breast: Secondary | ICD-10-CM

## 2012-07-01 NOTE — Telephone Encounter (Signed)
[name] need referral to [facility] for her Mammogram by 10/3.  Please inform when sent

## 2012-07-01 NOTE — Telephone Encounter (Signed)
Per Donna,CMA she has given the NPI in the order for the mammogram.

## 2012-07-16 ENCOUNTER — Ambulatory Visit: Payer: Medicaid Other | Admitting: Advanced Practice Midwife

## 2012-07-16 ENCOUNTER — Ambulatory Visit (INDEPENDENT_AMBULATORY_CARE_PROVIDER_SITE_OTHER): Payer: Medicaid Other | Admitting: Obstetrics & Gynecology

## 2012-07-16 ENCOUNTER — Encounter: Payer: Self-pay | Admitting: Obstetrics & Gynecology

## 2012-07-16 VITALS — BP 103/69 | HR 70 | Temp 97.9°F | Ht 65.0 in | Wt 158.4 lb

## 2012-07-16 DIAGNOSIS — N898 Other specified noninflammatory disorders of vagina: Secondary | ICD-10-CM

## 2012-07-16 NOTE — Progress Notes (Signed)
  Subjective:    Patient ID: Kristina Moreno, female    DOB: Mar 28, 1966, 46 y.o.   MRN: [id number]  HPI  She is here because she thinks she might have a yeast infection. She took ABX last month for some boils which have now resolved. She took a diflucan at that time. She has no other problems.  Review of Systems  Her mammogram is scheduled for 07-24-12    Objective:   Physical Exam   Normal vagina and white discharge     Assessment & Plan:   Reassurance given Schedule annual exam

## 2012-07-17 LAB — WET PREP, GENITAL: Trich, Wet Prep: NONE SEEN

## 2012-07-31 ENCOUNTER — Ambulatory Visit (HOSPITAL_COMMUNITY)
Admission: RE | Admit: 2012-07-31 | Discharge: 2012-07-31 | Disposition: A | Discharge status: Home / Self Care | Payer: Medicaid Other | Source: Ambulatory Visit | Attending: Family Medicine | Admitting: Family Medicine

## 2012-07-31 DIAGNOSIS — Z1231 Encounter for screening mammogram for malignant neoplasm of breast: Secondary | ICD-10-CM | POA: Insufficient documentation

## 2012-08-15 ENCOUNTER — Ambulatory Visit (INDEPENDENT_AMBULATORY_CARE_PROVIDER_SITE_OTHER): Payer: Medicaid Other | Admitting: *Deleted

## 2012-08-15 DIAGNOSIS — Z23 Encounter for immunization: Secondary | ICD-10-CM

## 2012-09-16 ENCOUNTER — Inpatient Hospital Stay (HOSPITAL_COMMUNITY)
Admission: AD | Admit: 2012-09-16 | Discharge: 2012-09-16 | Disposition: A | Discharge status: Home / Self Care | Payer: Medicaid Other | Source: Ambulatory Visit | Attending: Obstetrics & Gynecology | Admitting: Obstetrics & Gynecology

## 2012-09-16 ENCOUNTER — Encounter (HOSPITAL_COMMUNITY): Payer: Self-pay

## 2012-09-16 DIAGNOSIS — A499 Bacterial infection, unspecified: Secondary | ICD-10-CM | POA: Insufficient documentation

## 2012-09-16 DIAGNOSIS — B9689 Other specified bacterial agents as the cause of diseases classified elsewhere: Secondary | ICD-10-CM | POA: Insufficient documentation

## 2012-09-16 DIAGNOSIS — L293 Anogenital pruritus, unspecified: Secondary | ICD-10-CM | POA: Insufficient documentation

## 2012-09-16 DIAGNOSIS — N760 Acute vaginitis: Secondary | ICD-10-CM | POA: Insufficient documentation

## 2012-09-16 LAB — WET PREP, GENITAL
Trich, Wet Prep: NONE SEEN
Yeast Wet Prep HPF POC: NONE SEEN

## 2012-09-16 MED ORDER — HYDROCORTISONE 1 % EX CREA: TOPICAL_CREAM | Freq: Two times a day (BID) | CUTANEOUS | Status: DC

## 2012-09-16 MED ORDER — FLUCONAZOLE 150 MG PO TABS: 150.0000 mg | ORAL_TABLET | Freq: Once | ORAL | Status: DC

## 2012-09-16 MED ORDER — METRONIDAZOLE 500 MG PO TABS: 500.0000 mg | ORAL_TABLET | Freq: Two times a day (BID) | ORAL | Status: DC

## 2012-09-16 NOTE — MAU Note (Signed)
Patient states she had unprotected sex about one week ago and is not having a Winiecki discharge with itching and irritation.
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
## 2012-09-17 LAB — GC/CHLAMYDIA PROBE AMP
CT Probe RNA: NEGATIVE
GC Probe RNA: NEGATIVE

## 2012-10-30 ENCOUNTER — Ambulatory Visit (INDEPENDENT_AMBULATORY_CARE_PROVIDER_SITE_OTHER): Payer: Medicaid Other | Admitting: Family Medicine

## 2012-10-30 ENCOUNTER — Encounter: Payer: Self-pay | Admitting: Family Medicine

## 2012-10-30 VITALS — BP 114/69 | HR 74 | Ht 64.5 in | Wt 159.0 lb

## 2012-10-30 DIAGNOSIS — F410 Panic disorder [episodic paroxysmal anxiety] without agoraphobia: Secondary | ICD-10-CM

## 2012-10-30 DIAGNOSIS — H8109 Meniere's disease, unspecified ear: Secondary | ICD-10-CM

## 2012-10-30 LAB — BASIC METABOLIC PANEL
CO2: 29 mEq/L (ref 19–32)
Glucose, Bld: 80 mg/dL (ref 70–99)
Potassium: 4.2 mEq/L (ref 3.5–5.3)
Sodium: 137 mEq/L (ref 135–145)

## 2012-10-30 MED ORDER — HYDROCHLOROTHIAZIDE 12.5 MG PO CAPS: 12.5000 mg | ORAL_CAPSULE | Freq: Every day | ORAL | Status: DC

## 2012-10-30 MED ORDER — ALPRAZOLAM 0.5 MG PO TABS: 0.2500 mg | ORAL_TABLET | Freq: Three times a day (TID) | ORAL | Status: DC | PRN

## 2012-10-30 NOTE — Progress Notes (Signed)
  Subjective:    Patient ID: Kristina Moreno, female    DOB: Oct 22, 1966, 47 y.o.   MRN: [id number]  HPI 1. Anxiety with panic:  Needs refill on xanax.  Does not use them that often, typically only 1/2 tab per day when first going to work.  Feels that it works well to control her symptoms.  She tries to minimize the stressor in her life and avoid things that cause her anxiety to worsen.   2. Meniere disease:  Has been followed at [facility].  Started on HCTZ and low sodium diet for treatment and has had good success.  She would like a refill of hctz for continued treatment.  She denies dizziness or other side effects with thsi medication.  She has not had lab work done since starting this medication   Review of Systems Per HPI    Objective:   Physical Exam  Constitutional: She appears well-nourished. No distress.  Cardiovascular: Normal rate and regular rhythm.   Pulmonary/Chest: Effort normal and breath sounds normal. No respiratory distress.  Musculoskeletal: Normal range of motion. She exhibits no edema.  Psychiatric: She has a normal mood and affect. Her behavior is normal.          Assessment & Plan:

## 2012-10-30 NOTE — Assessment & Plan Note (Signed)
Xanax refilled.  Working well for her, uses appropriately.

## 2012-10-30 NOTE — Assessment & Plan Note (Signed)
I explained to her that as I am not very familiar with the use of HCTZ for meniere treatment that I would refill for one month but defer to ENT from here out.  I am not sure how long she needs to stay on this medication. Will check BMET to be sure renal function and electrolytes are still normal.

## 2012-10-30 NOTE — Patient Instructions (Addendum)
Thank you for coming in today, it was good to see you I am not sure how long the ENT doctor wants to keep you on HCTZ.  I will refill for one month but defer to them for additional refills.

## 2012-12-02 ENCOUNTER — Telehealth: Payer: Self-pay | Admitting: Family Medicine

## 2012-12-02 NOTE — Telephone Encounter (Signed)
Patient is calling requesting a note from dr. 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  She would like a call when it is ready to be picked up.

## 2012-12-02 NOTE — Telephone Encounter (Signed)
Will forward to Dr Matthews 

## 2012-12-18 NOTE — Telephone Encounter (Signed)
Completed.

## 2012-12-18 NOTE — Telephone Encounter (Signed)
Pt wants to pick this letter up today - not sure why it has taken so long.

## 2012-12-31 ENCOUNTER — Telehealth: Payer: Self-pay | Admitting: Family Medicine

## 2012-12-31 MED ORDER — ALBUTEROL SULFATE HFA 108 (90 BASE) MCG/ACT IN AERS: 2.0000 | INHALATION_SPRAY | Freq: Four times a day (QID) | RESPIRATORY_TRACT | Status: DC | PRN

## 2012-12-31 NOTE — Telephone Encounter (Signed)
Completed.

## 2012-12-31 NOTE — Telephone Encounter (Signed)
Patient is needing a refill on hr Albuterol inhaler sen to [facility] on [address].

## 2013-05-19 ENCOUNTER — Ambulatory Visit (INDEPENDENT_AMBULATORY_CARE_PROVIDER_SITE_OTHER): Payer: Medicaid Other | Admitting: Family Medicine

## 2013-05-19 ENCOUNTER — Encounter: Payer: Self-pay | Admitting: Family Medicine

## 2013-05-19 VITALS — BP 112/70 | HR 71 | Ht 64.5 in | Wt 158.0 lb

## 2013-05-19 DIAGNOSIS — E785 Hyperlipidemia, unspecified: Secondary | ICD-10-CM

## 2013-05-19 DIAGNOSIS — J449 Chronic obstructive pulmonary disease, unspecified: Secondary | ICD-10-CM

## 2013-05-19 DIAGNOSIS — F411 Generalized anxiety disorder: Secondary | ICD-10-CM

## 2013-05-19 DIAGNOSIS — H8109 Meniere's disease, unspecified ear: Secondary | ICD-10-CM

## 2013-05-19 DIAGNOSIS — H8103 Meniere's disease, bilateral: Secondary | ICD-10-CM

## 2013-05-19 MED ORDER — ALPRAZOLAM 0.5 MG PO TABS: 0.2500 mg | ORAL_TABLET | Freq: Three times a day (TID) | ORAL | Status: DC | PRN

## 2013-05-19 MED ORDER — ALBUTEROL SULFATE HFA 108 (90 BASE) MCG/ACT IN AERS: 2.0000 | INHALATION_SPRAY | Freq: Four times a day (QID) | RESPIRATORY_TRACT | Status: DC | PRN

## 2013-05-19 NOTE — Patient Instructions (Addendum)
Thanks for coming in today. It was good to meet you!  Today we discussed your medications and your anxiety.  Overall you are doing well! Continue walking, eating healthy, and doing your best to control your daily stress! We discussed the possibility of starting a new medication for your anxiety, such as Celexa or Lexapro. These medications are classified as antidepressants, but are very effective at controlling anxiety. Please look into this and consider the option of starting one of these. We could try a trial for several months to see if you tolerate it and improve. Ideally, this would be a replacement for your Xanax, and I feel could help your anxiety more.  We will get you scheduled for a lab only appointment to check your Cholesterol. Please come fasting for at least 8 hours. You may have one cup of coffee if you need to in the morning. Take your normal medications as well.  Please schedule a follow-up appointment with 3 in months, to discuss potential options for anxiety treatment and med refills.  If you have any other questions or concerns, please feel free to call the clinic to contact me. You may also schedule another appointment if necessary.  However, if your symptoms get significantly worse, please go to the Emergency Department to seek immediate medical attention.  Saralyn Pilar, DO [facility]

## 2013-05-19 NOTE — Progress Notes (Signed)
Subjective:     Patient ID: Kristina Moreno, female   DOB: 14-Feb-1966, 47 y.o.   MRN: [id number]  HPI   ANXIETY DISORDER Hx Panic Attacks - last 2 years ago, treated in ED started on Xanax has taken ever since Reports that she feels "restless, anxious, wired" in the morning, and the Xanax helps her calm down and deal with the stresses of each day.  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 Reports that it makes it happy, but does cause stress Meds - Xanax 0.5mg  in AM after breakfast, occasionally will take half a tab (0.25mg ) in evening, and no  more than 0.75mg  daily. Acknowledges it is an addictive substance. Reports compliance.   COPD Hx - former smoker (see below, social hx) Denies chest pain or tightness, shortness of breath, wheezing, coughing or sputum production. No recent URI. Meds - Albuterol HFA, rarely needs. Takes 2x monthly  Meniere Disease Hx - Previously followed by ENT at [facility]. Stable. Denies any vertigo, ringing, or ear fullness today.  Continues HCTZ 12.5mg  daily. Stated that this was part of trial for Meniere's. Would like to continue it.   Family Hx - Reported Anxiety / Mood disorder in multiple relatives including both sons. (age 75, 74) Social Hx - Current marijuana smoker multiple times weekly (states she "self-medicates" with this and it helps calm her down). Former tobacco smoker (30 yr hx 0.5ppd). Minimal EtOH use.  Health Maintenance: Diet/exercise - tries to eat healthy, frequent salads. Walks daily 20+min Lipid panel - last done 2011, would like her cholesterol checked.   Review of Systems  See above HPI. Otherwise, all other systems negative.     Objective:   Physical Exam  BP 112/70  Pulse 71  Ht 5' 4.5" (1.638 m)  Wt 158 lb (71.668 kg)  BMI 26.71 kg/m2  General - pleasant, mildly anxious appearing, WDWN, NAD HEENT - PERRLA, no conjunctival injection, EOMI, pharynx clear Neck - supple,  non-tender, no LAD, no thyromegaly Heart - Regular rate and rhythm.  No murmurs, gallops or rubs.    Lungs:  Normal respiratory effort, chest expands symmetrically. Lungs are clear to auscultation, no crackles or wheezes. Neuro - grossly non-focal, normal muscle str bilateral 5/5, intact distal sensation to light touch Ext - peripheral pulses +2 bilateral, no edema Psych - alert, oriented, good insight

## 2013-05-19 NOTE — Assessment & Plan Note (Addendum)
Breathing stable. No shortness of breath or wheezing. Former smoker, 43yr hx 0.5ppd. Current smoking marijuana Only medication - albuterol HFA (rescue inhaler) uses 2x monthly. Rarely needed  Refilled Albuterol HFA today.  Continue to follow, if use of Albuterol HFA increases, may consider ICS maintenance therapy.

## 2013-05-19 NOTE — Assessment & Plan Note (Signed)
Stable. Denies any vertigo, ringing, or ear fullness today. Previously followed by ENT at [facility]. Continues HCTZ 12.5mg  daily. Stated that this was part of trial for Meniere's. Would like to continue it. No refills needed at this time.

## 2013-05-19 NOTE — Assessment & Plan Note (Addendum)
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 Discussed possibility of withdrawal side-effects. 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 Continues to smoke marijuana, but minimal alcohol.  Refilled Xanax 0.5mg  #45 x 3 refills.  Discussed possibility of starting on SSRI (Celexa or Lexapro) for better control of her anxiety. Has not tried any alternative med or CBT. She will consider this and look into these medications more to discuss at our next apt in 3 months. She is concerned about side-effects.  Also reports concern of some short-term memory loss that is possibly worsening. Would like a "test done", likely can do PHQ-9 and MMSE at next visit. Will ask family if hx of Alzheimer's. Given phone # for Dr. Pascal Lux if interested in CBT.

## 2013-06-01 ENCOUNTER — Other Ambulatory Visit: Payer: Medicaid Other

## 2013-07-28 ENCOUNTER — Encounter (HOSPITAL_COMMUNITY): Payer: Self-pay | Admitting: *Deleted

## 2013-07-28 ENCOUNTER — Emergency Department (INDEPENDENT_AMBULATORY_CARE_PROVIDER_SITE_OTHER)
Admission: EM | Admit: 2013-07-28 | Discharge: 2013-07-28 | Disposition: A | Discharge status: Home / Self Care | Payer: Medicaid Other | Source: Home / Self Care | Attending: Family Medicine | Admitting: Family Medicine

## 2013-07-28 DIAGNOSIS — M545 Low back pain: Secondary | ICD-10-CM

## 2013-07-28 MED ORDER — CYCLOBENZAPRINE HCL 5 MG PO TABS: 5.0000 mg | ORAL_TABLET | Freq: Three times a day (TID) | ORAL | Status: DC | PRN

## 2013-07-28 MED ORDER — DICLOFENAC POTASSIUM 50 MG PO TABS: 50.0000 mg | ORAL_TABLET | Freq: Three times a day (TID) | ORAL | Status: DC
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
## 2013-08-07 ENCOUNTER — Ambulatory Visit: Payer: Self-pay | Admitting: Family Medicine

## 2013-08-17 ENCOUNTER — Ambulatory Visit: Payer: Self-pay

## 2013-08-25 ENCOUNTER — Ambulatory Visit (INDEPENDENT_AMBULATORY_CARE_PROVIDER_SITE_OTHER): Payer: Medicaid Other | Admitting: *Deleted

## 2013-08-25 DIAGNOSIS — Z23 Encounter for immunization: Secondary | ICD-10-CM

## 2013-09-08 ENCOUNTER — Ambulatory Visit (INDEPENDENT_AMBULATORY_CARE_PROVIDER_SITE_OTHER): Payer: Medicaid Other | Admitting: Family Medicine

## 2013-09-08 ENCOUNTER — Encounter: Payer: Self-pay | Admitting: Family Medicine

## 2013-09-08 VITALS — BP 126/77 | HR 62 | Temp 98.2°F | Ht 64.5 in | Wt 159.0 lb

## 2013-09-08 DIAGNOSIS — H60399 Other infective otitis externa, unspecified ear: Secondary | ICD-10-CM

## 2013-09-08 DIAGNOSIS — H60392 Other infective otitis externa, left ear: Secondary | ICD-10-CM

## 2013-09-08 DIAGNOSIS — B372 Candidiasis of skin and nail: Secondary | ICD-10-CM

## 2013-09-08 MED ORDER — CICLOPIROX 0.77 % EX GEL: 1.0000 "application " | Freq: Two times a day (BID) | CUTANEOUS | Status: DC

## 2013-09-08 MED ORDER — NYSTATIN 100000 UNIT/GM EX POWD: 1.0000 g | Freq: Three times a day (TID) | CUTANEOUS | Status: DC

## 2013-09-08 MED ORDER — MUPIROCIN 2 % EX OINT: 1.0000 "application " | TOPICAL_OINTMENT | Freq: Two times a day (BID) | CUTANEOUS | Status: DC

## 2013-09-08 NOTE — Patient Instructions (Signed)
For your breast rash I am prescribing an antifungal cream called nystatin which you should apply to the red areas until they clear up. Then use baby powder to keep the area dry and prevent future infections.  For your ear, I sent in an antibiotic ointment that you should use until the crusting goes away. Please do not scratch or pull at either rash and do not use alcohol as these things can cause more irritation and make the itching worse. If the ear rash becomes redder, spreads, or you get a fever, please call us and we can give you an antibiotic pill.  Thank you,  Dr. Oretha Milch Intertrigo is a skin irritation (inflammation) that happens in warm, moist areas of the body. It happens mostly between folds of skin or where skin rubs together. HOME CARE  Keep the affected area cool and dry.  Leave the skin folds open to air.  Put cotton or linen between the folds of skin.  Avoid tight clothing.  Wear open-toed shoes or sandals.  Use powder on the affected area as told by your doctor.  Only use medicated creams or pastes as told by your doctor. GET HELP RIGHT AWAY IF:  The rash does not get better after 1 week of treatment.  The rash gets worse.  You have a fever or chills. MAKE SURE YOU:  Understand these instructions.  Will watch your condition.  Will get help right away if you are not doing well or get worse. Document Released: 11/10/2010 Document Revised: 12/31/2011 Document Reviewed: 11/10/2010 [facility] Patient Information 2014 [place], [place].

## 2013-09-08 NOTE — Assessment & Plan Note (Signed)
Topical ciclopirox and nystatin powder until rash resolved then baby powder for prevention - gave other prevention tips

## 2013-09-08 NOTE — Progress Notes (Signed)
  Subjective:    Patient ID: Kristina Moreno, female    DOB: [birth year], 47 y.o.   MRN: [id number]  HPI Pt presents with rash on left earlobe for the last 2 weeks. Initially red and sore but now just itchy and crusted. She has tried neosporin, alcohol and hydrocortisone with no improvement.  Pt also has a red vesicular rash below her left breast that started in the intertriginous area and then spread to the surrounded part of her lower chest.    Review of Systems  Unable to perform ROS Constitutional: Negative for fever and chills.  HENT: Positive for ear pain. Negative for ear discharge and facial swelling.   Skin: Positive for rash and wound.  All other systems reviewed and are negative.       Objective:   Physical Exam  Nursing note and vitals reviewed. Constitutional: She appears well-developed and well-nourished. No distress.  HENT:  Head: Normocephalic and atraumatic.    Right Ear: External ear normal.  Left Ear: No swelling or tenderness.  Ears:  Nose: Nose normal.  Eyes: Conjunctivae are normal. Right eye exhibits no discharge. Left eye exhibits no discharge. No scleral icterus.  Pulmonary/Chest: Effort normal.  Skin: Skin is warm and dry. Rash noted. Rash is vesicular. She is not diaphoretic. There is erythema.             Assessment & Plan:

## 2013-09-08 NOTE — Assessment & Plan Note (Signed)
Likely spread from infected piercing, improving with neosporin but still crusty - topical bactroban bid until resolves - no alcohol, no itching

## 2013-09-09 ENCOUNTER — Telehealth: Payer: Self-pay | Admitting: *Deleted

## 2013-09-09 DIAGNOSIS — B372 Candidiasis of skin and nail: Secondary | ICD-10-CM

## 2013-09-09 NOTE — Telephone Encounter (Signed)
Received fax request for prior authorization from pharmacy for ciclopirox gel.  Copy of Medicaid formulary and form placed in MD's box for completion.  Gaylene Brooks, RN

## 2013-09-10 MED ORDER — CICLOPIROX OLAMINE 0.77 % EX CREA: TOPICAL_CREAM | Freq: Two times a day (BID) | CUTANEOUS | Status: DC

## 2013-09-10 NOTE — Telephone Encounter (Signed)
Please advise patient that new prescription for antifungal cream that is preferred by her insurance has been sent in to her pharmacy.

## 2013-09-10 NOTE — Telephone Encounter (Signed)
Pt notified and states cream is working.  Jeffrie Stander, Darlyne Russian, CMA

## 2013-09-21 ENCOUNTER — Encounter: Payer: Self-pay | Admitting: Family Medicine

## 2013-09-21 ENCOUNTER — Ambulatory Visit (INDEPENDENT_AMBULATORY_CARE_PROVIDER_SITE_OTHER): Payer: Medicaid Other | Admitting: Family Medicine

## 2013-09-21 ENCOUNTER — Other Ambulatory Visit: Payer: Self-pay | Admitting: Family Medicine

## 2013-09-21 VITALS — BP 103/65 | HR 75 | Temp 99.0°F | Resp 18 | Wt 160.0 lb

## 2013-09-21 DIAGNOSIS — Z1231 Encounter for screening mammogram for malignant neoplasm of breast: Secondary | ICD-10-CM

## 2013-09-21 DIAGNOSIS — F411 Generalized anxiety disorder: Secondary | ICD-10-CM

## 2013-09-21 NOTE — Patient Instructions (Signed)
Dear Berna Bue, Thank you for coming in to clinic today. It was good to see you again!  Today we discussed your Anxiety. 1. I believe that you are dealing with a chronic mood disorder problem, likely Bipolar Disorder. This likely has been affecting you for a long time and could be causing most of your anxiety related and hyperactive symptoms. 2. We will work to figure out the best treatment for you, and it may involve decreasing or eventually quitting your marijuana smoking. 3. You would benefit most from having a thorough evaluation and investigation into this by the Mood Disorder Clinic here at the [facility] ([place]). You will be meeting with our psychologist, Dr. Spero Geralds, for help dealing with your symptoms.  You can schedule an appointment with her by calling her directly at [phone].  Please schedule a follow-up appointment with me as needed. I would prefer to see you after you have been seen in the Mood Clinic, and then we can discuss future treatment options.  If you have any other questions or concerns, please feel free to call the clinic to contact me. You may also schedule an earlier appointment if necessary.  However, if your symptoms get significantly worse, please go to the Emergency Department to seek immediate medical attention.  Saralyn Pilar, DO [facility]

## 2013-09-21 NOTE — Progress Notes (Signed)
Subjective:     Patient ID: Kristina Moreno, female   DOB: 1966/08/01, 47 y.o.   MRN: [id number]  HPI  ANXIETY / MOOD DISORDER Presents today to further discuss potential medical options to treat her anxiety, as she would like to come off of Xanax (has taken x 2+ years now, started taking after a panic attack, takes half of 0.5mg  tab daily, occasional takes 1 tab daily). Continues to feel "anxious and wired", feels like she has high energy and it is hard for her to "come down". Describes her mind as hyperactive, and often has difficulty sleeping due to this. Function - difficulty with maintaining close relationships (last < 1 month), feels she is overbearing to others. Mood - good spirits, occasional bad days, but denies feeling sad or depressed No current suicidal or homicidal ideation.  Family Hx - Reported Anxiety / Mood disorder in multiple relatives including both sons. (age 62, 26)   Social Hx - Current marijuana smoker (daily), states she "self-medicates" with this and it helps calm her down, has been smoking since 47 yr old. Former tobacco smoker (30 yr hx 0.5ppd). Denies EtOH use. Previously reported that she quit her job and started [employer]. Admits to good family support with her sons.   Review of Systems  See above HPI. Otherwise, all other systems negative.  Denies any fever, chills, weight gain or loss, change in vision, CP, dyspnea, or hallucinations.      Objective:   Physical Exam  BP 103/65  Pulse 75  Temp(Src) 99 F (37.2 C) (Oral)  Resp 18  Wt 160 lb (72.576 kg)  SpO2 100%  Gen - pleasant, energetic and talkative (but does not interrupt), difficulty sitting still, NAD  HEENT - PERRLA, no conjunctival injection, EOMI Neck - supple, non-tender, no thyromegaly  Heart - RRR, no murmurs  Lungs - CTAB, no crackles, wheezing, or rhonchi. Normal work of breathing Neuro - awake, alert, oriented Psych - mildly pressured speech, appropriate thought  content, congruent affect, good insight into condition, no significant tangential thoughts     Assessment:     See specific A&P problem list for details.      Plan:     See specific A&P problem list for details.

## 2013-09-21 NOTE — Assessment & Plan Note (Addendum)
Anxiety / Hyperactivity / Manic symptoms clinically suggestive of underlying mood disorder. Suspect Bipolar d/o +Family h/o anxiety and mood disorder Concurrent substance abuse with marijuana, may complicate presentation or future treatment  Plan: 1. Refer to [facility] Clinic (Dr. Pascal Lux and Dr. Kathrynn Running) for further evaluation of possible Bipolar mood disorder. Given contact # to schedule apt 2. Plan to hold on initiating any treatment for anxiety, previously discussed SSRI. However concern now of potentially unmasking worsening manic symptoms 3. No refill on Xanax at this time. 4. Discussed confounding factor of marijuana, and agree that in future if plans to medically treat that she will need to cut back and quit. 5. F/u after seen in [facility], to continue management

## 2013-09-21 NOTE — Progress Notes (Signed)
Pt is here for a f/u and review meds; Xanax Voices no new concerns... Alert w/no signs of acute distress.... Rmeza, CMA

## 2013-09-25 ENCOUNTER — Telehealth: Payer: Self-pay | Admitting: Psychology

## 2013-09-25 NOTE — Telephone Encounter (Signed)
Patient called to schedule an appointment.  She is off work on Mondays and does not think she can make a Wednesday appointment which is the only day [facility] meets.  Provided her with [facility] outpatient number and told her to request an appointment with a psychiatrist.  Also gave her [facility] number but would prefer for her to be seen at [facility] or [facility].  Asked her to call me back if she had any difficulty securing an appointment.

## 2013-09-28 ENCOUNTER — Encounter: Payer: Self-pay | Admitting: Family Medicine

## 2013-09-28 ENCOUNTER — Ambulatory Visit (INDEPENDENT_AMBULATORY_CARE_PROVIDER_SITE_OTHER): Payer: Medicaid Other | Admitting: Family Medicine

## 2013-09-28 VITALS — BP 115/74 | HR 65 | Temp 98.1°F | Wt 161.0 lb

## 2013-09-28 DIAGNOSIS — H00013 Hordeolum externum right eye, unspecified eyelid: Secondary | ICD-10-CM

## 2013-09-28 DIAGNOSIS — H00019 Hordeolum externum unspecified eye, unspecified eyelid: Secondary | ICD-10-CM

## 2013-09-28 NOTE — Assessment & Plan Note (Signed)
Stye over the medial aspect of the right upper eyelid. Conservative treatment with warm compresses discussed with the patient. No need for antibiotics at this time. She may take over-the-counter Motrin or Tylenol as needed for pain.

## 2013-09-28 NOTE — Progress Notes (Signed)
   Subjective:    Patient ID: Kristina Moreno, female    DOB: [birth year], 47 y.o.   MRN: [id number]  HPI 47 year old [place] American female presents for evaluation of "stye "of her right eyelid, she has had increased swelling and some mild drainage of the medial aspect of the right upper eyelid for the past 2 days, she has a history of stye in the same location, she is been using warm compresses over the area which provided minimal relief, she reports a mild eye irritation, no fevers, no chills, no eyelid warmth, no vision changes, no rhinorrhea   Review of Systems  Constitutional: Negative for fever, chills and fatigue.  Respiratory: Negative for cough, choking and shortness of breath.   Cardiovascular: Positive for leg swelling. Negative for chest pain.       Objective:   Physical Exam Vitals: Reviewed General: Pleasant African American female, no acute distress HEENT: Normocephalic, pupils are equal round and reactive to light, extraocular movements are intact, stye of the medial aspect of the upper eyelid present without active drainage, mild erythema, moist mucous members, no pharyngeal erythema or exudate, neck was supple, no anterior posterior cervical lymphadenopathy       Assessment & Plan:  Please see problem specific assessment and plan.

## 2013-09-28 NOTE — Patient Instructions (Signed)
Your have a stye of your eye. Please continue to apply warm compresses to the affected area.

## 2013-10-01 ENCOUNTER — Ambulatory Visit (HOSPITAL_COMMUNITY)
Admission: RE | Admit: 2013-10-01 | Discharge: 2013-10-01 | Disposition: A | Discharge status: Home / Self Care | Payer: Medicaid Other | Source: Ambulatory Visit | Attending: Internal Medicine | Admitting: Internal Medicine

## 2013-10-01 DIAGNOSIS — Z1231 Encounter for screening mammogram for malignant neoplasm of breast: Secondary | ICD-10-CM | POA: Insufficient documentation

## 2013-11-05 ENCOUNTER — Encounter: Payer: Self-pay | Admitting: Obstetrics and Gynecology

## 2013-11-05 ENCOUNTER — Ambulatory Visit (INDEPENDENT_AMBULATORY_CARE_PROVIDER_SITE_OTHER): Payer: Medicaid Other | Admitting: Obstetrics and Gynecology

## 2013-11-05 VITALS — BP 114/74 | HR 74 | Ht 65.0 in | Wt 166.2 lb

## 2013-11-05 DIAGNOSIS — Z01419 Encounter for gynecological examination (general) (routine) without abnormal findings: Secondary | ICD-10-CM

## 2013-11-05 NOTE — Patient Instructions (Addendum)
Preventive Care for Adults, Female A healthy lifestyle and preventive care can promote health and wellness. Preventive health guidelines for women include the following key practices.  A routine yearly physical is a good way to check with your health care provider about your health and preventive screening. It is a chance to share any concerns and updates on your health and to receive a thorough exam.  Visit your dentist for a routine exam and preventive care every 6 months. Brush your teeth twice a day and floss once a day. Good oral hygiene prevents tooth decay and gum disease.  The frequency of eye exams is based on your age, health, family medical history, use of contact lenses, and other factors. Follow your health care provider's recommendations for frequency of eye exams.  Eat a healthy diet. Foods like vegetables, fruits, whole grains, low-fat dairy products, and lean protein foods contain the nutrients you need without too many calories. Decrease your intake of foods high in solid fats, added sugars, and salt. Eat the right amount of calories for you.Get information about a proper diet from your health care provider, if necessary.  Regular physical exercise is one of the most important things you can do for your health. Most adults should get at least 150 minutes of moderate-intensity exercise (any activity that increases your heart rate and causes you to sweat) each week. In addition, most adults need muscle-strengthening exercises on 2 or more days a week.  Maintain a healthy weight. The body mass index (BMI) is a screening tool to identify possible weight problems. It provides an estimate of body fat based on height and weight. Your health care provider can find your BMI, and can help you achieve or maintain a healthy weight.For adults 20 years and older:  A BMI below 18.5 is considered underweight.  A BMI of 18.5 to 24.9 is normal.  A BMI of 25 to 29.9 is considered  overweight.  A BMI of 30 and above is considered obese.  Maintain normal blood lipids and cholesterol levels by exercising and minimizing your intake of saturated fat. Eat a balanced diet with plenty of fruit and vegetables. Blood tests for lipids and cholesterol should begin at age 20 and be repeated every 5 years. If your lipid or cholesterol levels are high, you are over 50, or you are at high risk for heart disease, you may need your cholesterol levels checked more frequently.Ongoing high lipid and cholesterol levels should be treated with medicines if diet and exercise are not working.  If you smoke, find out from your health care provider how to quit. If you do not use tobacco, do not start.  Lung cancer screening is recommended for adults aged 55 80 years who are at high risk for developing lung cancer because of a history of smoking. A yearly low-dose CT scan of the lungs is recommended for people who have at least a 30-pack-year history of smoking and are a current smoker or have quit within the past 15 years. A pack year of smoking is smoking an average of 1 pack of cigarettes a day for 1 year (for example: 1 pack a day for 30 years or 2 packs a day for 15 years). Yearly screening should continue until the smoker has stopped smoking for at least 15 years. Yearly screening should be stopped for people who develop a health problem that would prevent them from having lung cancer treatment.  If you are pregnant, do not drink alcohol. If you   are breastfeeding, be very cautious about drinking alcohol. If you are not pregnant and choose to drink alcohol, do not have more than 1 drink per day. One drink is considered to be 12 ounces (355 mL) of beer, 5 ounces (148 mL) of wine, or 1.5 ounces (44 mL) of liquor.  Avoid use of street drugs. Do not share needles with anyone. Ask for help if you need support or instructions about stopping the use of drugs.  High blood pressure causes heart disease and  increases the risk of stroke. Your blood pressure should be checked at least every 1 to 2 years. Ongoing high blood pressure should be treated with medicines if weight loss and exercise do not work.  If you are 20 50 years old, ask your health care provider if you should take aspirin to prevent strokes.  Diabetes screening involves taking a blood sample to check your fasting blood sugar level. This should be done once every 3 years, after age 35, if you are within normal weight and without risk factors for diabetes. Testing should be considered at a younger age or be carried out more frequently if you are overweight and have at least 1 risk factor for diabetes.  Breast cancer screening is essential preventive care for women. You should practice "breast self-awareness." This means understanding the normal appearance and feel of your breasts and may include breast self-examination. Any changes detected, no matter how small, should be reported to a health care provider. Women in their 42s and 30s should have a clinical breast exam (CBE) by a health care provider as part of a regular health exam every 1 to 3 years. After age 74, women should have a CBE every year. Starting at age 43, women should consider having a mammogram (breast X-ray test) every year. Women who have a family history of breast cancer should talk to their health care provider about genetic screening. Women at a high risk of breast cancer should talk to their health care providers about having an MRI and a mammogram every year.  Breast cancer gene (BRCA)-related cancer risk assessment is recommended for women who have family members with BRCA-related cancers. BRCA-related cancers include breast, ovarian, tubal, and peritoneal cancers. Having family members with these cancers may be associated with an increased risk for harmful changes (mutations) in the breast cancer genes BRCA1 and BRCA2. Results of the assessment will determine the need for  genetic counseling and BRCA1 and BRCA2 testing.  The Pap test is a screening test for cervical cancer. A Pap test can show cell changes on the cervix that might become cervical cancer if left untreated. A Pap test is a procedure in which cells are obtained and examined from the lower end of the uterus (cervix).  Women should have a Pap test starting at age 60.  Between ages 63 and 62, Pap tests should be repeated every 2 years.  Beginning at age 43, you should have a Pap test every 3 years as long as the past 3 Pap tests have been normal.  Some women have medical problems that increase the chance of getting cervical cancer. Talk to your health care provider about these problems. It is especially important to talk to your health care provider if a new problem develops soon after your last Pap test. In these cases, your health care provider may recommend more frequent screening and Pap tests.  The above recommendations are the same for women who have or have not gotten the vaccine  for human papillomavirus (HPV).  If you had a hysterectomy for a problem that was not cancer or a condition that could lead to cancer, then you no longer need Pap tests. Even if you no longer need a Pap test, a regular exam is a good idea to make sure no other problems are starting.  If you are between ages 65 and 70 years, and you have had normal Pap tests going back 10 years, you no longer need Pap tests. Even if you no longer need a Pap test, a regular exam is a good idea to make sure no other problems are starting.  If you have had past treatment for cervical cancer or a condition that could lead to cancer, you need Pap tests and screening for cancer for at least 20 years after your treatment.  If Pap tests have been discontinued, risk factors (such as a new sexual partner) need to be reassessed to determine if screening should be resumed.  The HPV test is an additional test that may be used for cervical cancer  screening. The HPV test looks for the virus that can cause the cell changes on the cervix. The cells collected during the Pap test can be tested for HPV. The HPV test could be used to screen women aged 30 years and older, and should be used in women of any age who have unclear Pap test results. After the age of 30, women should have HPV testing at the same frequency as a Pap test.  Colorectal cancer can be detected and often prevented. Most routine colorectal cancer screening begins at the age of 50 years and continues through age 75 years. However, your health care provider may recommend screening at an earlier age if you have risk factors for colon cancer. On a yearly basis, your health care provider may provide home test kits to check for hidden blood in the stool. Use of a small camera at the end of a tube, to directly examine the colon (sigmoidoscopy or colonoscopy), can detect the earliest forms of colorectal cancer. Talk to your health care provider about this at age 50, when routine screening begins. Direct exam of the colon should be repeated every 5 10 years through age 75 years, unless early forms of pre-cancerous polyps or small growths are found.  People who are at an increased risk for hepatitis B should be screened for this virus. You are considered at high risk for hepatitis B if:  You were born in a country where hepatitis B occurs often. Talk with your health care provider about which countries are considered high risk.  Your parents were born in a high-risk country and you have not received a shot to protect against hepatitis B (hepatitis B vaccine).  You have HIV or AIDS.  You use needles to inject street drugs.  You live with, or have sex with, someone who has Hepatitis B.  You get hemodialysis treatment.  You take certain medicines for conditions like cancer, organ transplantation, and autoimmune conditions.  Hepatitis C blood testing is recommended for all people born from  1945 through 1965 and any individual with known risks for hepatitis C.  Practice safe sex. Use condoms and avoid high-risk sexual practices to reduce the spread of sexually transmitted infections (STIs). STIs include gonorrhea, chlamydia, syphilis, trichomonas, herpes, HPV, and human immunodeficiency virus (HIV). Herpes, HIV, and HPV are viral illnesses that have no cure. They can result in disability, cancer, and death. Sexually active women aged 25   years and younger should be checked for chlamydia. Older women with new or multiple partners should also be tested for chlamydia. Testing for other STIs is recommended if you are sexually active and at increased risk.  Osteoporosis is a disease in which the bones lose minerals and strength with aging. This can result in serious bone fractures or breaks. The risk of osteoporosis can be identified using a bone density scan. Women ages 65 years and over and women at risk for fractures or osteoporosis should discuss screening with their health care providers. Ask your health care provider whether you should take a calcium supplement or vitamin D to reduce the rate of osteoporosis.  Menopause can be associated with physical symptoms and risks. Hormone replacement therapy is available to decrease symptoms and risks. You should talk to your health care provider about whether hormone replacement therapy is right for you.  Use sunscreen. Apply sunscreen liberally and repeatedly throughout the day. You should seek shade when your shadow is shorter than you. Protect yourself by wearing long sleeves, pants, a wide-brimmed hat, and sunglasses year round, whenever you are outdoors.  Once a month, do a whole body skin exam, using a mirror to look at the skin on your back. Tell your health care provider of new moles, moles that have irregular borders, moles that are larger than a pencil eraser, or moles that have changed in shape or color.  Stay current with required  vaccines (immunizations).  Influenza vaccine. All adults should be immunized every year.  Tetanus, diphtheria, and acellular pertussis (Td, Tdap) vaccine. Pregnant women should receive 1 dose of Tdap vaccine during each pregnancy. The dose should be obtained regardless of the length of time since the last dose. Immunization is preferred during the 27th 36th week of gestation. An adult who has not previously received Tdap or who does not know her vaccine status should receive 1 dose of Tdap. This initial dose should be followed by tetanus and diphtheria toxoids (Td) booster doses every 10 years. Adults with an unknown or incomplete history of completing a 3-dose immunization series with Td-containing vaccines should begin or complete a primary immunization series including a Tdap dose. Adults should receive a Td booster every 10 years.  Varicella vaccine. An adult without evidence of immunity to varicella should receive 2 doses or a second dose if she has previously received 1 dose. Pregnant females who do not have evidence of immunity should receive the first dose after pregnancy. This first dose should be obtained before leaving the health care facility. The second dose should be obtained 4 8 weeks after the first dose.  Human papillomavirus (HPV) vaccine. Females aged 13 26 years who have not received the vaccine previously should obtain the 3-dose series. The vaccine is not recommended for use in pregnant females. However, pregnancy testing is not needed before receiving a dose. If a female is found to be pregnant after receiving a dose, no treatment is needed. In that case, the remaining doses should be delayed until after the pregnancy. Immunization is recommended for any person with an immunocompromised condition through the age of 26 years if she did not get any or all doses earlier. During the 3-dose series, the second dose should be obtained 4 8 weeks after the first dose. The third dose should be  obtained 24 weeks after the first dose and 16 weeks after the second dose.  Zoster vaccine. One dose is recommended for adults aged 60 years or older unless certain   conditions are present.  Measles, mumps, and rubella (MMR) vaccine. Adults born before 1957 generally are considered immune to measles and mumps. Adults born in 1957 or later should have 1 or more doses of MMR vaccine unless there is a contraindication to the vaccine or there is laboratory evidence of immunity to each of the three diseases. A routine second dose of MMR vaccine should be obtained at least 28 days after the first dose for students attending postsecondary schools, health care workers, or international travelers. People who received inactivated measles vaccine or an unknown type of measles vaccine during 1963 1967 should receive 2 doses of MMR vaccine. People who received inactivated mumps vaccine or an unknown type of mumps vaccine before 1979 and are at high risk for mumps infection should consider immunization with 2 doses of MMR vaccine. For females of childbearing age, rubella immunity should be determined. If there is no evidence of immunity, females who are not pregnant should be vaccinated. If there is no evidence of immunity, females who are pregnant should delay immunization until after pregnancy. Unvaccinated health care workers born before 1957 who lack laboratory evidence of measles, mumps, or rubella immunity or laboratory confirmation of disease should consider measles and mumps immunization with 2 doses of MMR vaccine or rubella immunization with 1 dose of MMR vaccine.  Pneumococcal 13-valent conjugate (PCV13) vaccine. When indicated, a person who is uncertain of her immunization history and has no record of immunization should receive the PCV13 vaccine. An adult aged 19 years or older who has certain medical conditions and has not been previously immunized should receive 1 dose of PCV13 vaccine. This PCV13 should be  followed with a dose of pneumococcal polysaccharide (PPSV23) vaccine. The PPSV23 vaccine dose should be obtained at least 8 weeks after the dose of PCV13 vaccine. An adult aged 19 years or older who has certain medical conditions and previously received 1 or more doses of PPSV23 vaccine should receive 1 dose of PCV13. The PCV13 vaccine dose should be obtained 1 or more years after the last PPSV23 vaccine dose.  Pneumococcal polysaccharide (PPSV23) vaccine. When PCV13 is also indicated, PCV13 should be obtained first. All adults aged 65 years and older should be immunized. An adult younger than age 65 years who has certain medical conditions should be immunized. Any person who resides in a nursing home or long-term care facility should be immunized. An adult smoker should be immunized. People with an immunocompromised condition and certain other conditions should receive both PCV13 and PPSV23 vaccines. People with human immunodeficiency virus (HIV) infection should be immunized as soon as possible after diagnosis. Immunization during chemotherapy or radiation therapy should be avoided. Routine use of PPSV23 vaccine is not recommended for American Indians, [place] Natives, or people younger than 65 years unless there are medical conditions that require PPSV23 vaccine. When indicated, people who have unknown immunization and have no record of immunization should receive PPSV23 vaccine. One-time revaccination 5 years after the first dose of PPSV23 is recommended for people aged 19 64 years who have chronic kidney failure, nephrotic syndrome, asplenia, or immunocompromised conditions. People who received 1 2 doses of PPSV23 before age 65 years should receive another dose of PPSV23 vaccine at age 65 years or later if at least 5 years have passed since the previous dose. Doses of PPSV23 are not needed for people immunized with PPSV23 at or after age 65 years.  Meningococcal vaccine. Adults with asplenia or persistent  complement component deficiencies should receive 2   doses of quadrivalent meningococcal conjugate (MenACWY-D) vaccine. The doses should be obtained at least 2 months apart. Microbiologists working with certain meningococcal bacteria, military recruits, people at risk during an outbreak, and people who travel to or live in countries with a high rate of meningitis should be immunized. A first-year college student up through age 21 years who is living in a residence hall should receive a dose if she did not receive a dose on or after her 16th birthday. Adults who have certain high-risk conditions should receive one or more doses of vaccine.  Hepatitis A vaccine. Adults who wish to be protected from this disease, have certain high-risk conditions, work with hepatitis A-infected animals, work in hepatitis A research labs, or travel to or work in countries with a high rate of hepatitis A should be immunized. Adults who were previously unvaccinated and who anticipate close contact with an international adoptee during the first 60 days after arrival in the [place] from a country with a high rate of hepatitis A should be immunized.  Hepatitis B vaccine. Adults who wish to be protected from this disease, have certain high-risk conditions, may be exposed to blood or other infectious body fluids, are household contacts or sex partners of hepatitis B positive people, are clients or workers in certain care facilities, or travel to or work in countries with a high rate of hepatitis B should be immunized.  Haemophilus influenzae type b (Hib) vaccine. A previously unvaccinated person with asplenia or sickle cell disease or having a scheduled splenectomy should receive 1 dose of Hib vaccine. Regardless of previous immunization, a recipient of a hematopoietic stem cell transplant should receive a 3-dose series 6 12 months after her successful transplant. Hib vaccine is not recommended for adults with HIV  infection. Preventive Services / Frequency Ages 19 to 39years  Blood pressure check.** / Every 1 to 2 years.  Lipid and cholesterol check.** / Every 5 years beginning at age 20.  Clinical breast exam.** / Every 3 years for women in their 20s and 30s.  BRCA-related cancer risk assessment.** / For women who have family members with a BRCA-related cancer (breast, ovarian, tubal, or peritoneal cancers).  Pap test.** / Every 2 years from ages 21 through 29. Every 3 years starting at age 30 through age 65 or 70 with a history of 3 consecutive normal Pap tests.  HPV screening.** / Every 3 years from ages 30 through ages 65 to 70 with a history of 3 consecutive normal Pap tests.  Hepatitis C blood test.** / For any individual with known risks for hepatitis C.  Skin self-exam. / Monthly.  Influenza vaccine. / Every year.  Tetanus, diphtheria, and acellular pertussis (Tdap, Td) vaccine.** / Consult your health care provider. Pregnant women should receive 1 dose of Tdap vaccine during each pregnancy. 1 dose of Td every 10 years.  Varicella vaccine.** / Consult your health care provider. Pregnant females who do not have evidence of immunity should receive the first dose after pregnancy.  HPV vaccine. / 3 doses over 6 months, if 26 and younger. The vaccine is not recommended for use in pregnant females. However, pregnancy testing is not needed before receiving a dose.  Measles, mumps, rubella (MMR) vaccine.** / You need at least 1 dose of MMR if you were born in 1957 or later. You may also need a 2nd dose. For females of childbearing age, rubella immunity should be determined. If there is no evidence of immunity, females who are not   pregnant should be vaccinated. If there is no evidence of immunity, females who are pregnant should delay immunization until after pregnancy.  Pneumococcal 13-valent conjugate (PCV13) vaccine.** / Consult your health care provider.  Pneumococcal polysaccharide (PPSV23)  vaccine.** / 1 to 2 doses if you smoke cigarettes or if you have certain conditions.  Meningococcal vaccine.** / 1 dose if you are age 88 to 41 years and a Market researcher living in a residence hall, or have one of several medical conditions, you need to get vaccinated against meningococcal disease. You may also need additional booster doses.  Hepatitis A vaccine.** / Consult your health care provider.  Hepatitis B vaccine.** / Consult your health care provider.  Haemophilus influenzae type b (Hib) vaccine.** / Consult your health care provider. Ages 45 to 64years  Blood pressure check.** / Every 1 to 2 years.  Lipid and cholesterol check.** / Every 5 years beginning at age 2 years.  Lung cancer screening. / Every year if you are aged 10 80 years and have a 30-pack-year history of smoking and currently smoke or have quit within the past 15 years. Yearly screening is stopped once you have quit smoking for at least 15 years or develop a health problem that would prevent you from having lung cancer treatment.  Clinical breast exam.** / Every year after age 28 years.  BRCA-related cancer risk assessment.** / For women who have family members with a BRCA-related cancer (breast, ovarian, tubal, or peritoneal cancers).  Mammogram.** / Every year beginning at age 63 years and continuing for as long as you are in good health. Consult with your health care provider.  Pap test.** / Every 3 years starting at age 71 years through age 39 or 90 years with a history of 3 consecutive normal Pap tests.  HPV screening.** / Every 3 years from ages 27 years through ages 32 to 72 years with a history of 3 consecutive normal Pap tests.  Fecal occult blood test (FOBT) of stool. / Every year beginning at age 40 years and continuing until age 29 years. You may not need to do this test if you get a colonoscopy every 10 years.  Flexible sigmoidoscopy or colonoscopy.** / Every 5 years for a flexible  sigmoidoscopy or every 10 years for a colonoscopy beginning at age 66 years and continuing until age 47 years.  Hepatitis C blood test.** / For all people born from [birth year] through 1967 and any individual with known risks for hepatitis C.  Skin self-exam. / Monthly.  Influenza vaccine. / Every year.  Tetanus, diphtheria, and acellular pertussis (Tdap/Td) vaccine.** / Consult your health care provider. Pregnant women should receive 1 dose of Tdap vaccine during each pregnancy. 1 dose of Td every 10 years.  Varicella vaccine.** / Consult your health care provider. Pregnant females who do not have evidence of immunity should receive the first dose after pregnancy.  Zoster vaccine.** / 1 dose for adults aged 39 years or older.  Measles, mumps, rubella (MMR) vaccine.** / You need at least 1 dose of MMR if you were born in 1957 or later. You may also need a 2nd dose. For females of childbearing age, rubella immunity should be determined. If there is no evidence of immunity, females who are not pregnant should be vaccinated. If there is no evidence of immunity, females who are pregnant should delay immunization until after pregnancy.  Pneumococcal 13-valent conjugate (PCV13) vaccine.** / Consult your health care provider.  Pneumococcal polysaccharide (PPSV23) vaccine.** / 1  to 2 doses if you smoke cigarettes or if you have certain conditions.  Meningococcal vaccine.** / Consult your health care provider.  Hepatitis A vaccine.** / Consult your health care provider.  Hepatitis B vaccine.** / Consult your health care provider.  Haemophilus influenzae type b (Hib) vaccine.** / Consult your health care provider. Ages 63 years and over  Blood pressure check.** / Every 1 to 2 years.  Lipid and cholesterol check.** / Every 5 years beginning at age 30 years.  Lung cancer screening. / Every year if you are aged 46 80 years and have a 30-pack-year history of smoking and currently smoke or have quit  within the past 15 years. Yearly screening is stopped once you have quit smoking for at least 15 years or develop a health problem that would prevent you from having lung cancer treatment.  Clinical breast exam.** / Every year after age 27 years.  BRCA-related cancer risk assessment.** / For women who have family members with a BRCA-related cancer (breast, ovarian, tubal, or peritoneal cancers).  Mammogram.** / Every year beginning at age 79 years and continuing for as long as you are in good health. Consult with your health care provider.  Pap test.** / Every 3 years starting at age 62 years through age 36 or 5 years with 3 consecutive normal Pap tests. Testing can be stopped between 65 and 70 years with 3 consecutive normal Pap tests and no abnormal Pap or HPV tests in the past 10 years.  HPV screening.** / Every 3 years from ages 36 years through ages 2 or 46 years with a history of 3 consecutive normal Pap tests. Testing can be stopped between 65 and 70 years with 3 consecutive normal Pap tests and no abnormal Pap or HPV tests in the past 10 years.  Fecal occult blood test (FOBT) of stool. / Every year beginning at age 21 years and continuing until age 36 years. You may not need to do this test if you get a colonoscopy every 10 years.  Flexible sigmoidoscopy or colonoscopy.** / Every 5 years for a flexible sigmoidoscopy or every 10 years for a colonoscopy beginning at age 4 years and continuing until age 56 years.  Hepatitis C blood test.** / For all people born from [birth year] through 1967 and any individual with known risks for hepatitis C.  Osteoporosis screening.** / A one-time screening for women ages 45 years and over and women at risk for fractures or osteoporosis.  Skin self-exam. / Monthly.  Influenza vaccine. / Every year.  Tetanus, diphtheria, and acellular pertussis (Tdap/Td) vaccine.** / 1 dose of Td every 10 years.  Varicella vaccine.** / Consult your health care  provider.  Zoster vaccine.** / 1 dose for adults aged 52 years or older.  Pneumococcal 13-valent conjugate (PCV13) vaccine.** / Consult your health care provider.  Pneumococcal polysaccharide (PPSV23) vaccine.** / 1 dose for all adults aged 59 years and older.  Meningococcal vaccine.** / Consult your health care provider.  Hepatitis A vaccine.** / Consult your health care provider.  Hepatitis B vaccine.** / Consult your health care provider.  Haemophilus influenzae type b (Hib) vaccine.** / Consult your health care provider. ** Family history and personal history of risk and conditions may change your health care provider's recommendations. Document Released: 12/04/2001 Document Revised: 07/29/2013 Document Reviewed: 03/05/2011 [facility] Patient Information 2014 [place], [place].    Menopause and Herbal Products Menopause is the normal time of life when menstrual periods stop completely. Menopause is complete when you have  missed 12 consecutive menstrual periods. It usually occurs between the ages of 47 to 30, with an average age of 17. Very rarely does a woman develop menopause before 50 years old. At menopause, your ovaries stop producing the female hormones, estrogen and progesterone. This can cause undesirable symptoms and also affect your health. Sometimes the symptoms can occur 4 to 5 years before the menopause begins. There is no relationship between menopause and:  Oral contraceptives.  Number of children you had.  Race.  The age your menstrual periods started (menarche). Heavy smokers and very thin women may develop menopause earlier in life. Estrogen and progesterone hormone treatment is the usual method of treating menopausal symptoms. However, there are women who should not take hormone treatment. This is true of:   Women that have breast or uterine cancer.  Women who prefer not to take hormones because of certain side effects (abnormal uterine bleeding).  Women who are  afraid that hormones may cause breast cancer.  Women who have a history of liver disease, heart disease, stroke, or blood clots. For these women, there are other medications that may help treat their menopausal symptoms. These medications are found in plants and botanical products. They can be found in the form of herbs, teas, oils, tinctures, and pills.  CAUSES:  The ovaries stop producing the female hormones estrogen and progesterone.  Other causes include:  Surgery to remove both ovaries.  The ovaries stop functioning for no know reason.  Tumors of the pituitary gland in the brain.  Medical disease that affects the ovaries and hormone production.  Radiation treatment to the abdomen or pelvis.  Chemotherapy that affects the ovaries. PHYTOESTROGENS: Phytoestrogens occur naturally in plants and plant products. They act like estrogen in the body. Herbal medications are made from these plants and botanical steroids. There are 3 types of phytoestrogens:  Isoflavones (genistein and daidzein) are found in soy, garbanzo beans, miso and tofu foods.  Ligins are found in the shell of seeds. They are used to make oils like flaxseed oil. The bacteria in your intestine act on these foods to produce the estrogen-like hormones.  Coumestans are estrogen-like. Some of the foods they are found in include sunflower seeds and bean sprouts. CONDITIONS AND THEIR POSSIBLE HERBAL TREATMENT:  Hot flashes and night sweats.  Soy, black cohosh and evening primrose.  Irritability, insomnia, depression and memory problems.  Chasteberry, ginseng, and soy.  St. John's wort may be helpful for depression. However, there is a concern of it causing cataracts of the eye and may have bad effects on other medications. St. John's wort should not be taken for long time and without your caregiver's advice.  Loss of libido and vaginal and skin dryness.  Wild yam and soy.  Prevention of coronary heart disease and  osteoporosis.  Soy and Isoflavones. Several studies have shown that some women benefit from herbal medications, but most of the studies have not consistently shown that these supplements are much better than placebo. Other forms of treatment to help women with menopausal symptoms include a balanced diet, rest, exercise, vitamin and calcium (with vitamin D) supplements, acupuncture, and group therapy when necessary. THOSE WHO SHOULD NOT TAKE HERBAL MEDICATIONS INCLUDE:  Women who are planning on getting pregnant unless told by your caregiver.  Women who are breastfeeding unless told by your caregiver.  Women who are taking other prescription medications unless told by your caregiver.  Infants, children, and elderly women unless told by your caregiver. Different herbal medications  have different and unmeasured amounts of the herbal ingredients. There are no regulations, quality control, and standardization of the ingredients in herbal medications. Therefore, the amount of the ingredient in the medication may vary from one herb, pill, tea, oil or tincture to another. Many herbal medications can cause serious problems and can even have poisonous effects if taken too much or too long. If problems develop, the medication should be stopped and recorded by your caregiver. HOME CARE INSTRUCTIONS  Do not take or give children herbal medications without your caregiver's advice.  Let your caregiver know all the medications you are taking. This includes prescription, over-the-counter, eye drops, and creams.  Do not take herbal medications longer or more than recommended.  Tell your caregiver about any side effects from the medication. SEEK MEDICAL CARE IF:  You develop a fever of 102 F (38.9 C), or as directed by your caregiver.  You feel sick to your stomach (nauseous), vomit, or have diarrhea.  You develop a rash.  You develop abdominal pain.  You develop severe headaches.  You start to  have vision problems.  You feel dizzy or faint.  You start to feel numbness in any part of your body.  You start shaking (have convulsions). Document Released: 03/26/2008 Document Revised: 09/24/2012 Document Reviewed: 10/24/2010 [facility] Patient Information 2014 [place].

## 2013-11-05 NOTE — Progress Notes (Signed)
  Subjective:     Kristina Moreno is a 48 y.o. female [id number] s/p TAH in 2012 who is here for a comprehensive physical exam. The patient reports no problems. She reports recent onset of hot flashes and night sweats which she feels are mild in comparison to what her friends are experiencing. She is sexually active without issues and denies symptoms of urinary incontinence.  History   Social History  . Marital Status: Single    Spouse Name: N/A    Number of Children: N/A  . Years of Education: N/A   Occupational History  . Not on file.   Social History Main Topics  . Smoking status: Former Research scientist (life sciences)  . Smokeless tobacco: Never Used  . Alcohol Use: Yes     Comment: 40 oz beer "a couple times a week."  . Drug Use: 7.00 per week    Special: Marijuana     Comment: patient states that she uses marijuana everyday.  Marland Kitchen Sexual Activity: Yes    Birth Control/ Protection: Surgical, Condom   Other Topics Concern  . Not on file   Social History Narrative  . No narrative on file   Health Maintenance  Topic Date Due  . Pap Smear  03/15/2014  . Influenza Vaccine  05/22/2014  . Tetanus/tdap  03/23/2015   Past Medical History  Diagnosis Date  . Anxiety   . Asthma   . Meniere disease   . COPD (chronic obstructive pulmonary disease)    Past Surgical History  Procedure Laterality Date  . Abdominal hysterectomy     Family History  Problem Relation Age of Onset  . Anesthesia problems Neg Hx   . Hypotension Neg Hx   . Malignant hyperthermia Neg Hx   . Pseudochol deficiency Neg Hx    History  Substance Use Topics  . Smoking status: Former Research scientist (life sciences)  . Smokeless tobacco: Never Used  . Alcohol Use: Yes     Comment: 40 oz beer "a couple times a week."       Review of Systems A comprehensive review of systems was negative.   Objective:      GENERAL: Well-developed, well-nourished female in no acute distress.  HEENT: Normocephalic, atraumatic. Sclerae anicteric.  NECK: Supple.  Normal thyroid.  LUNGS: Clear to auscultation bilaterally.  HEART: Regular rate and rhythm. BREASTS: Symmetric in size. No palpable masses or lymphadenopathy, skin changes, or nipple drainage. ABDOMEN: Soft, nontender, nondistended. PELVIC: Normal external female genitalia. Vagina is pink and rugated.  Normal discharge. No adnexal mass or tenderness. EXTREMITIES: No cyanosis, clubbing, or edema, 2+ distal pulses.    Assessment:    Healthy female exam.      Plan:     Normal female exam Reviewed mammogram results RTC in a year or prn See After Visit Summary for Counseling Recommendations

## 2013-11-30 ENCOUNTER — Telehealth: Payer: Self-pay | Admitting: Family Medicine

## 2013-11-30 ENCOUNTER — Ambulatory Visit (HOSPITAL_COMMUNITY): Payer: Medicaid Other | Admitting: Psychiatry

## 2013-11-30 NOTE — Telephone Encounter (Signed)
Patient saw a commercial about Thyroid removal surgery causing cancer. She had a partial removal about 7 years ago.  Would like to speak to MD/nurse about this. Please call back.

## 2013-12-09 ENCOUNTER — Ambulatory Visit (HOSPITAL_COMMUNITY): Payer: Medicaid Other | Admitting: Psychiatry

## 2013-12-23 ENCOUNTER — Encounter (HOSPITAL_COMMUNITY): Payer: Self-pay | Admitting: Psychiatry

## 2013-12-23 ENCOUNTER — Ambulatory Visit (INDEPENDENT_AMBULATORY_CARE_PROVIDER_SITE_OTHER): Payer: Medicaid Other | Admitting: Psychiatry

## 2013-12-23 VITALS — BP 113/82 | HR 80 | Ht 65.0 in | Wt 169.0 lb

## 2013-12-23 DIAGNOSIS — F319 Bipolar disorder, unspecified: Secondary | ICD-10-CM

## 2013-12-23 DIAGNOSIS — F1910 Other psychoactive substance abuse, uncomplicated: Secondary | ICD-10-CM

## 2013-12-23 DIAGNOSIS — F1010 Alcohol abuse, uncomplicated: Secondary | ICD-10-CM

## 2013-12-23 MED ORDER — LAMOTRIGINE 25 MG PO TABS: ORAL_TABLET | ORAL | Status: DC
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
## 2013-12-28 ENCOUNTER — Other Ambulatory Visit: Payer: Self-pay | Admitting: Family Medicine

## 2013-12-28 NOTE — Telephone Encounter (Signed)
Reviewed patient's chart. Plan to continue Alprazolam 0.5 mg tabs, take 0.25mg  up to TID PRN anxiety, (#45, 3 refills). Phoned in to [place].  Nobie Putnam, [place], PGY-1

## 2013-12-31 ENCOUNTER — Emergency Department (HOSPITAL_COMMUNITY)
Admission: EM | Admit: 2013-12-31 | Discharge: 2013-12-31 | Disposition: A | Discharge status: Home / Self Care | Payer: Medicaid Other | Source: Home / Self Care

## 2013-12-31 ENCOUNTER — Encounter (HOSPITAL_COMMUNITY): Payer: Self-pay | Admitting: Emergency Medicine

## 2013-12-31 DIAGNOSIS — J449 Chronic obstructive pulmonary disease, unspecified: Secondary | ICD-10-CM

## 2013-12-31 DIAGNOSIS — H1131 Conjunctival hemorrhage, right eye: Secondary | ICD-10-CM

## 2013-12-31 DIAGNOSIS — H1130 Conjunctival hemorrhage, unspecified eye: Secondary | ICD-10-CM

## 2013-12-31 DIAGNOSIS — J069 Acute upper respiratory infection, unspecified: Secondary | ICD-10-CM

## 2013-12-31 NOTE — ED Notes (Signed)
Pt  Reports  She  Noticed  Some  Redness       r  Eye  When  She  Woke  Up  This am       -  Pt  States  She  Has  Has  Sinus  Drainage  Congestion and  Coughing  yest

## 2013-12-31 NOTE — Discharge Instructions (Signed)
Upper Respiratory Infection, Adult An upper respiratory infection (URI) is also sometimes known as the common cold. The upper respiratory tract includes the nose, sinuses, throat, trachea, and bronchi. Bronchi are the airways leading to the lungs. Most people improve within 1 week, but symptoms can last up to 2 weeks. A residual cough may last even longer.  CAUSES Many different viruses can infect the tissues lining the upper respiratory tract. The tissues become irritated and inflamed and often become very moist. Mucus production is also common. A cold is contagious. You can easily spread the virus to others by oral contact. This includes kissing, sharing a glass, coughing, or sneezing. Touching your mouth or nose and then touching a surface, which is then touched by another person, can also spread the virus. SYMPTOMS  Symptoms typically develop 1 to 3 days after you come in contact with a cold virus. Symptoms vary from person to person. They may include:  Runny nose.  Sneezing.  Nasal congestion.  Sinus irritation.  Sore throat.  Loss of voice (laryngitis).  Cough.  Fatigue.  Muscle aches.  Loss of appetite.  Headache.  Low-grade fever. DIAGNOSIS  You might diagnose your own cold based on familiar symptoms, since most people get a cold 2 to 3 times a year. Your caregiver can confirm this based on your exam. Most importantly, your caregiver can check that your symptoms are not due to another disease such as strep throat, sinusitis, pneumonia, asthma, or epiglottitis. Blood tests, throat tests, and X-rays are not necessary to diagnose a common cold, but they may sometimes be helpful in excluding other more serious diseases. Your caregiver will decide if any further tests are required. RISKS AND COMPLICATIONS  You may be at risk for a more severe case of the common cold if you smoke cigarettes, have chronic heart disease (such as heart failure) or lung disease (such as asthma), or if  you have a weakened immune system. The very young and very old are also at risk for more serious infections. Bacterial sinusitis, middle ear infections, and bacterial pneumonia can complicate the common cold. The common cold can worsen asthma and chronic obstructive pulmonary disease (COPD). Sometimes, these complications can require emergency medical care and may be life-threatening. PREVENTION  The best way to protect against getting a cold is to practice good hygiene. Avoid oral or hand contact with people with cold symptoms. Wash your hands often if contact occurs. There is no clear evidence that vitamin C, vitamin E, echinacea, or exercise reduces the chance of developing a cold. However, it is always recommended to get plenty of rest and practice good nutrition. TREATMENT  Treatment is directed at relieving symptoms. There is no cure. Antibiotics are not effective, because the infection is caused by a virus, not by bacteria. Treatment may include:  Increased fluid intake. Sports drinks offer valuable electrolytes, sugars, and fluids.  Breathing heated mist or steam (vaporizer or shower).  Eating chicken soup or other clear broths, and maintaining good nutrition.  Getting plenty of rest.  Using gargles or lozenges for comfort.  Controlling fevers with ibuprofen or acetaminophen as directed by your caregiver.  Increasing usage of your inhaler if you have asthma. Zinc gel and zinc lozenges, taken in the first 24 hours of the common cold, can shorten the duration and lessen the severity of symptoms. Pain medicines may help with fever, muscle aches, and throat pain. A variety of non-prescription medicines are available to treat congestion and runny nose. Your caregiver  can make recommendations and may suggest nasal or lung inhalers for other symptoms.  HOME CARE INSTRUCTIONS   Only take over-the-counter or prescription medicines for pain, discomfort, or fever as directed by your  caregiver.  Use a warm mist humidifier or inhale steam from a shower to increase air moisture. This may keep secretions moist and make it easier to breathe.  Drink enough water and fluids to keep your urine clear or pale yellow.  Rest as needed.  Return to work when your temperature has returned to normal or as your caregiver advises. You may need to stay home longer to avoid infecting others. You can also use a face mask and careful hand washing to prevent spread of the virus. SEEK MEDICAL CARE IF:   After the first few days, you feel you are getting worse rather than better.  You need your caregiver's advice about medicines to control symptoms.  You develop chills, worsening shortness of breath, or [place] or red sputum. These may be signs of pneumonia.  You develop yellow or [place] nasal discharge or pain in the face, especially when you bend forward. These may be signs of sinusitis.  You develop a fever, swollen neck glands, pain with swallowing, or white areas in the back of your throat. These may be signs of strep throat. SEEK IMMEDIATE MEDICAL CARE IF:   You have a fever.  You develop severe or persistent headache, ear pain, sinus pain, or chest pain.  You develop wheezing, a prolonged cough, cough up blood, or have a change in your usual mucus (if you have chronic lung disease).  You develop sore muscles or a stiff neck. Document Released: 04/03/2001 Document Revised: 12/31/2011 Document Reviewed: 02/09/2011 [facility] Patient Information 2014 [place], [place].  Subconjunctival Hemorrhage A subconjunctival hemorrhage is a bright red patch covering a portion of the white of the eye. The white part of the eye is called the sclera, and it is covered by a thin membrane called the conjunctiva. This membrane is clear, except for tiny blood vessels that you can see with the naked eye. When your eye is irritated or inflamed and becomes red, it is because the vessels in the conjunctiva are  swollen. Sometimes, a blood vessel in the conjunctiva can break and bleed. When this occurs, the blood builds up between the conjunctiva and the sclera, and spreads out to create a red area. The red spot may be very small at first. It may then spread to cover a larger part of the surface of the eye, or even all of the visible white part of the eye. In almost all cases, the blood will go away and the eye will become white again. Before completely dissolving, however, the red area may spread. It may also become brownish-yellow in color, before going away. If a lot of blood collects under the conjunctiva, it may look like a bulge on the surface of the eye. This looks scary, but it will also eventually flatten out and go away. Subconjunctival hemorrhages do not cause pain, but if swollen, may cause a feeling of irritation. There is no effect on vision.  CAUSES   The most common cause is mild trauma (rubbing the eye, irritation).  Subconjunctival hemorrhages can happen because of coughing or straining (lifting heavy objects), vomiting, or sneezing.  In some cases, your doctor may want to check your blood pressure. High blood pressure can also cause a sunconjunctival hemorrhage.  Severe trauma or blunt injuries.  Diseases that affect blood clotting (  hemophilia, leukemia).  Abnormalities of blood vessels behind the eye (carotid cavernous sinus fistula).  Tumors behind the eye.  Certain drugs (aspirin, coumadin, heparin).  Recent eye surgery. HOME CARE INSTRUCTIONS   Do not worry about the appearance of your eye. You may continue your usual activities.  Often, follow-up is not necessary. SEEK MEDICAL CARE IF:   Your eye becomes painful.  The bleeding does not disappear within 3 weeks.  Bleeding occurs elsewhere, for example, under the skin, in the mouth, or in the other eye.  You have recurring subconjunctival hemorrhages. SEEK IMMEDIATE MEDICAL CARE IF:   Your vision changes or you  have difficulty seeing.  You develop severe headache, persistent vomiting, confusion, or abnormal drowsiness (lethargy).  Your eye seems to bulge or protrude from the eye socket.  You notice the sudden appearance of bruises, or have spontaneous bleeding elsewhere on your body. Document Released: 10/08/2005 Document Revised: 12/31/2011 Document Reviewed: 09/05/2009 [facility] Patient Information 2014 [place].

## 2013-12-31 NOTE — ED Provider Notes (Signed)
CSN: [id number]     Arrival date & time 12/31/13  1012 History   First MD Initiated Contact with Patient 12/31/13 1049     Chief Complaint  Patient presents with  . Eye Problem   (Consider location/radiation/quality/duration/timing/severity/associated sxs/prior Treatment) HPI Comments: 48 y o f awoke this AM with small red spot adjacent to the pupil of OD. Denies associated sx's No pain or vision problems.   Past Medical History  Diagnosis Date  . Anxiety   . Asthma   . Meniere disease   . COPD (chronic obstructive pulmonary disease)    Past Surgical History  Procedure Laterality Date  . Abdominal hysterectomy     Family History  Problem Relation Age of Onset  . Anesthesia problems Neg Hx   . Hypotension Neg Hx   . Malignant hyperthermia Neg Hx   . Pseudochol deficiency Neg Hx    History  Substance Use Topics  . Smoking status: Former Research scientist (life sciences)  . Smokeless tobacco: Never Used  . Alcohol Use: Yes     Comment: 40 oz beer "a couple times a week."   OB History   Grav Para Term Preterm Abortions TAB SAB Ect Mult Living   3 2 2  1 1    2      Review of Systems  Constitutional: Negative.   HENT: Positive for rhinorrhea and sinus pressure.   Eyes: Positive for redness. Negative for photophobia, pain, discharge, itching and visual disturbance.  Respiratory: Positive for cough.   All other systems reviewed and are negative.    Allergies  Review of patient's allergies indicates no known allergies.  Home Medications   Current Outpatient Rx  Name  Route  Sig  Dispense  Refill  . albuterol (PROVENTIL HFA;VENTOLIN HFA) 108 (90 BASE) MCG/ACT inhaler   Inhalation   Inhale 2 puffs into the lungs every 6 (six) hours as needed. For shortness of breath/asthma   1 Inhaler   2   . ALPRAZolam (XANAX) 0.5 MG tablet      TAKE 1/2 TABLET BY MOUTH THREE TIMES DAILY AS NEEDED FOR ANXIETY   45 tablet   3   . hydrochlorothiazide (MICROZIDE) 12.5 MG capsule   Oral   Take 1 capsule  (12.5 mg total) by mouth daily.   30 capsule   0   . hydrocortisone cream 1 %   Topical   Apply topically 2 (two) times daily.   30 g   0   . lamoTRIgine (LAMICTAL) 25 MG tablet      Take 1 tab daily for 1 week and than 2 tab daily   60 tablet   0   . Multiple Vitamins-Minerals (MULTIVITAMIN WITH MINERALS) tablet   Oral   Take 1 tablet by mouth daily.         . mupirocin ointment (BACTROBAN) 2 %   Topical   Apply 1 application topically 2 (two) times daily. Apply to left ear lobe and surrounding area 2 times a day   22 g   2   . nystatin (MYCOSTATIN/NYSTOP) 100000 UNIT/GM POWD   Topical   Apply 1 g topically 3 (three) times daily. Apply 3 times per day to red areas under left breast.   60 g   3    BP 128/85  Pulse 63  Temp(Src) 98.4 F (36.9 C) (Oral)  Resp 18  SpO2 100% Physical Exam  Nursing note and vitals reviewed. Constitutional: She is oriented to person, place, and time. She appears well-developed  and well-nourished. No distress.  HENT:  Mouth/Throat: Oropharynx is clear and moist. No oropharyngeal exudate.  Eyes: EOM are normal. Pupils are equal, round, and reactive to light. Right eye exhibits no discharge. Left eye exhibits no discharge. No scleral icterus.  Small subconjunctival hemorrhage at the 3 o'clock position of the iris. Remainder of eye exam is nl  Neck: Normal range of motion. Neck supple.  Cardiovascular: Normal rate and regular rhythm.   Pulmonary/Chest: Effort normal. She has wheezes.  Chronic diminished BS bilat due to COPD  Lymphadenopathy:    She has no cervical adenopathy.  Neurological: She is alert and oriented to person, place, and time.  Skin: Skin is warm and dry.  Psychiatric: She has a normal mood and affect.    ED Course  Procedures (including critical care time) Labs Review Labs Reviewed - No data to display Imaging Review No results found.   MDM   1. Subconjunctival hemorrhage of right eye   2. COPD (chronic  obstructive pulmonary disease)   3. URI (upper respiratory infection)      Reassurance For problems get rechecked Cont meds for chronic illnesses    Janne Napoleon, NP 12/31/13 1105  Janne Napoleon, NP 12/31/13 1105

## 2013-12-31 NOTE — ED Provider Notes (Signed)
Medical screening examination/treatment/procedure(s) were performed by a resident physician or non-physician practitioner and as the supervising physician I was immediately available for consultation/collaboration.  Malvina Schadler, MD    Emiel Kielty S Ashaunte Standley, MD 12/31/13 1412 

## 2014-01-11 ENCOUNTER — Ambulatory Visit (INDEPENDENT_AMBULATORY_CARE_PROVIDER_SITE_OTHER): Payer: Medicaid Other | Admitting: Family Medicine

## 2014-01-11 ENCOUNTER — Encounter: Payer: Self-pay | Admitting: Family Medicine

## 2014-01-11 VITALS — BP 123/76 | HR 74 | Temp 97.6°F | Ht 65.0 in | Wt 168.0 lb

## 2014-01-11 DIAGNOSIS — L089 Local infection of the skin and subcutaneous tissue, unspecified: Secondary | ICD-10-CM | POA: Insufficient documentation

## 2014-01-11 DIAGNOSIS — R21 Rash and other nonspecific skin eruption: Secondary | ICD-10-CM

## 2014-01-11 MED ORDER — TRIAMCINOLONE ACETONIDE 0.1 % EX CREA: 1.0000 "application " | TOPICAL_CREAM | Freq: Two times a day (BID) | CUTANEOUS | Status: DC

## 2014-01-11 NOTE — Patient Instructions (Signed)
It has been a pleasure to see you today. Please take the medications as prescribed. If you develop more lesions, the current ones change or other symptoms appear please come for re-evaluation.

## 2014-01-11 NOTE — Assessment & Plan Note (Signed)
Contact dermatitis vs insect bite. Pt stered on Lamictal but lesions were present before starting on this med.  Very localized lesions. No surrounded edema or erythema. No other symptoms Will treat with topical steroids and discussed with pt possible causes. Discontinue skin moisturizers and only use formulations for sensitive skin.  If more lesions appear or change in the current ones instructed to come for re-evaluation.

## 2014-01-11 NOTE — Progress Notes (Signed)
Family Medicine Office Visit Note   Subjective:   Patient ID: Kristina Moreno, female  DOB: 1965/11/09, 48 y.o.. MRN: [id number]   Pt that comes today complaining of rash on her right thigh for about 2 weeks. She reports has history of dry skin and was using Aveeno as moisturizer, recently she changed to [facility] and noticed itchy lesions on her right thigh. She reports no other part of her body involved and no other symptoms of fever, chills, nausea, vomiting, general malaise.   Review of Systems:  Per HPI  Objective:   Physical Exam: Gen:  NAD HEENT: Moist mucous membranes  CV: Regular rate and rhythm, no murmurs rubs or gallops PULM: Clear to auscultation bilaterally.  EXT: No edema Neuro: Alert and oriented x3. Grossly intact. SKIN: 4 lesions of 0.5 to 1cm diameter hives-like present in posterior aspect of right thigh. No other skin rashes or lesions. No surrounded edema or erythema.   Assessment & Plan:

## 2014-01-13 ENCOUNTER — Encounter (HOSPITAL_COMMUNITY): Payer: Self-pay | Admitting: Psychiatry

## 2014-01-13 ENCOUNTER — Ambulatory Visit (INDEPENDENT_AMBULATORY_CARE_PROVIDER_SITE_OTHER): Payer: Medicaid Other | Admitting: Psychiatry

## 2014-01-13 VITALS — BP 110/68 | HR 72 | Ht 65.0 in | Wt 168.2 lb

## 2014-01-13 DIAGNOSIS — F1010 Alcohol abuse, uncomplicated: Secondary | ICD-10-CM

## 2014-01-13 DIAGNOSIS — F1210 Cannabis abuse, uncomplicated: Secondary | ICD-10-CM

## 2014-01-13 DIAGNOSIS — F319 Bipolar disorder, unspecified: Secondary | ICD-10-CM

## 2014-01-13 MED ORDER — LAMOTRIGINE 100 MG PO TABS: 100.0000 mg | ORAL_TABLET | Freq: Every day | ORAL | Status: DC
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
## 2014-01-28 ENCOUNTER — Encounter: Payer: Self-pay | Admitting: Emergency Medicine

## 2014-01-28 ENCOUNTER — Ambulatory Visit (INDEPENDENT_AMBULATORY_CARE_PROVIDER_SITE_OTHER): Payer: Medicaid Other | Admitting: Emergency Medicine

## 2014-01-28 VITALS — BP 117/78 | HR 94 | Temp 98.5°F | Ht 65.0 in | Wt 168.0 lb

## 2014-01-28 DIAGNOSIS — R21 Rash and other nonspecific skin eruption: Secondary | ICD-10-CM

## 2014-01-28 MED ORDER — LORATADINE 10 MG PO TABS: 10.0000 mg | ORAL_TABLET | Freq: Every day | ORAL | Status: DC

## 2014-01-28 MED ORDER — FLUCONAZOLE 200 MG PO TABS: 200.0000 mg | ORAL_TABLET | ORAL | Status: DC

## 2014-01-28 NOTE — Patient Instructions (Signed)
It was nice to meet you!  I think your rash could be ringworm, an allergic thing or dry skin/eczema. We are going to treat all three. Take loratadine daily. Take diflucan 1 pill a week for 4 weeks. I recommended Eucerin Eczema lotion - apply 1-2 times a day.  Follow up in 2-3 weeks.

## 2014-01-28 NOTE — Assessment & Plan Note (Signed)
Scattered scaly patches. History leans more towards an allergic/dermatographia type rash.  Could also be scattered ringworm lesions given scaliness. Will treat both with loratadine and oral diflucan 200mg  qweek x4 weeks. Also recommended Eurcerin Eczema lotion. F/u in 2-3 weeks.  If no improvement, would recommend biopsy.

## 2014-01-28 NOTE — Progress Notes (Signed)
   Subjective:    Patient ID: Kristina Moreno, female    DOB: [birth year], 48 y.o.   MRN: [id number]  HPI Kristina Moreno is here for f/u rash.  She was seen about 2 weeks ago for a rash.  She was prescribed a steroid cream which she has been using without improvement.  Today, she states this started about 6-7 weeks ago.  The rash is scattered patches of dry, scaly skin that itch.  She states if she scratches at a spot she will see a bump pop up and then the scale a day or so later.  She has been using Nivea lotion as it helps with the itchiness.  No fevers or systemic symptoms.   Current Outpatient Prescriptions on File Prior to Visit  Medication Sig Dispense Refill  . albuterol (PROVENTIL HFA;VENTOLIN HFA) 108 (90 BASE) MCG/ACT inhaler Inhale 2 puffs into the lungs every 6 (six) hours as needed. For shortness of breath/asthma  1 Inhaler  2  . ALPRAZolam (XANAX) 0.5 MG tablet TAKE 1/2 TABLET BY MOUTH THREE TIMES DAILY AS NEEDED FOR ANXIETY  45 tablet  3  . hydrochlorothiazide (MICROZIDE) 12.5 MG capsule Take 1 capsule (12.5 mg total) by mouth daily.  30 capsule  0  . hydrocortisone cream 1 % Apply topically 2 (two) times daily.  30 g  0  . lamoTRIgine (LAMICTAL) 100 MG tablet Take 1 tablet (100 mg total) by mouth daily.  30 tablet  0  . Multiple Vitamins-Minerals (MULTIVITAMIN WITH MINERALS) tablet Take 1 tablet by mouth daily.      . mupirocin ointment (BACTROBAN) 2 % Apply 1 application topically 2 (two) times daily. Apply to left ear lobe and surrounding area 2 times a day  22 g  2  . nystatin (MYCOSTATIN/NYSTOP) 100000 UNIT/GM POWD Apply 1 g topically 3 (three) times daily. Apply 3 times per day to red areas under left breast.  60 g  3  . triamcinolone cream (KENALOG) 0.1 % Apply 1 application topically 2 (two) times daily.  30 g  0   No current facility-administered medications on file prior to visit.    I have reviewed and updated the following as appropriate: allergies and current  medications SHx: former smoker   Review of Systems See HPI    Objective:   Physical Exam BP 117/78  Pulse 94  Temp(Src) 98.5 F (36.9 C) (Oral)  Ht 5\' 5"  (1.651 m)  Wt 168 lb (76.204 kg)  BMI 27.96 kg/m2 Gen: alert, cooperative, NAD Skin: 1cm dry scaly patch on leg; 81mm erythematous papule with scale on stomach     Assessment & Plan:

## 2014-02-09 ENCOUNTER — Ambulatory Visit (INDEPENDENT_AMBULATORY_CARE_PROVIDER_SITE_OTHER): Payer: Medicaid Other | Admitting: Psychiatry

## 2014-02-09 DIAGNOSIS — F319 Bipolar disorder, unspecified: Secondary | ICD-10-CM

## 2014-02-10 ENCOUNTER — Ambulatory Visit (INDEPENDENT_AMBULATORY_CARE_PROVIDER_SITE_OTHER): Payer: Medicaid Other | Admitting: Psychiatry

## 2014-02-10 ENCOUNTER — Encounter (HOSPITAL_COMMUNITY): Payer: Self-pay | Admitting: Psychiatry

## 2014-02-10 VITALS — BP 127/83 | HR 80 | Ht 65.0 in | Wt 168.6 lb

## 2014-02-10 DIAGNOSIS — F319 Bipolar disorder, unspecified: Secondary | ICD-10-CM

## 2014-02-10 DIAGNOSIS — F1210 Cannabis abuse, uncomplicated: Secondary | ICD-10-CM

## 2014-02-10 DIAGNOSIS — F1010 Alcohol abuse, uncomplicated: Secondary | ICD-10-CM

## 2014-02-10 MED ORDER — ARIPIPRAZOLE 5 MG PO TABS: 5.0000 mg | ORAL_TABLET | Freq: Every day | ORAL | Status: DC
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
## 2014-02-25 ENCOUNTER — Ambulatory Visit (INDEPENDENT_AMBULATORY_CARE_PROVIDER_SITE_OTHER): Payer: Medicaid Other | Admitting: Emergency Medicine

## 2014-02-25 ENCOUNTER — Encounter: Payer: Self-pay | Admitting: Emergency Medicine

## 2014-02-25 VITALS — BP 109/79 | HR 81 | Ht 65.0 in | Wt 165.0 lb

## 2014-02-25 DIAGNOSIS — R21 Rash and other nonspecific skin eruption: Secondary | ICD-10-CM

## 2014-02-25 NOTE — Progress Notes (Signed)
   Subjective:    Patient ID: Kristina Moreno, female    DOB: 08-Feb-1966, 48 y.o.   MRN: [id number]  HPI Kristina Moreno is here for followup rash.  She reports no improvement with the allergy pill or the fungus pill. She states these spots continue to try coming ago. The worse one right now is on her right calf. The lesions tend to be slightly raised, red, scaly and itchy. Vaseline helps some with the itch.  Current Outpatient Prescriptions on File Prior to Visit  Medication Sig Dispense Refill  . albuterol (PROVENTIL HFA;VENTOLIN HFA) 108 (90 BASE) MCG/ACT inhaler Inhale 2 puffs into the lungs every 6 (six) hours as needed. For shortness of breath/asthma  1 Inhaler  2  . ALPRAZolam (XANAX) 0.5 MG tablet TAKE 1/2 TABLET BY MOUTH THREE TIMES DAILY AS NEEDED FOR ANXIETY  45 tablet  3  . ARIPiprazole (ABILIFY) 5 MG tablet Take 1 tablet (5 mg total) by mouth daily.  30 tablet  0  . fluconazole (DIFLUCAN) 200 MG tablet Take 1 tablet (200 mg total) by mouth once a week.  4 tablet  0  . hydrochlorothiazide (MICROZIDE) 12.5 MG capsule Take 1 capsule (12.5 mg total) by mouth daily.  30 capsule  0  . hydrocortisone cream 1 % Apply topically 2 (two) times daily.  30 g  0  . loratadine (CLARITIN) 10 MG tablet Take 1 tablet (10 mg total) by mouth daily.  30 tablet  11  . Multiple Vitamins-Minerals (MULTIVITAMIN WITH MINERALS) tablet Take 1 tablet by mouth daily.      . mupirocin ointment (BACTROBAN) 2 % Apply 1 application topically 2 (two) times daily. Apply to left ear lobe and surrounding area 2 times a day  22 g  2  . nystatin (MYCOSTATIN/NYSTOP) 100000 UNIT/GM POWD Apply 1 g topically 3 (three) times daily. Apply 3 times per day to red areas under left breast.  60 g  3  . triamcinolone cream (KENALOG) 0.1 % Apply 1 application topically 2 (two) times daily.  30 g  0   No current facility-administered medications on file prior to visit.    I have reviewed and updated the following as appropriate:  allergies and current medications SHx: non smoker   Review of Systems See HPI    Objective:   Physical Exam BP 109/79  Pulse 81  Ht 5\' 5"  (1.651 m)  Wt 165 lb (74.844 kg)  BMI 27.46 kg/m2 Gen: alert, cooperative, NAD Skin: scattered patches that are slightly raise, erythematous with a fine scale     Assessment & Plan:  After informed written consent was obtained, using Betadine for cleansing and 1% Lidocaine with epinephrine for anesthetic, with sterile technique a 4 mm punch biopsy was used to obtain a biopsy specimen of the lesion. Hemostasis was obtained by pressure and wound was not sutured. Antibiotic dressing is applied, and wound care instructions provided. Be alert for any signs of cutaneous infection. The specimen is labeled and sent to pathology for evaluation. The procedure was well tolerated without complications.

## 2014-02-25 NOTE — Patient Instructions (Signed)
It was nice to see you!  We did a punch biopsy of the rash to see if we can figure out what it is. You can shower and wash like normal. Please apply some antibiotic ointment and a bandage daily for the next 3 days.  I will call you with the results next week. In the meantime, you can use vaseline and over the counter hydrocortisone cream to help with the itching.

## 2014-02-25 NOTE — Assessment & Plan Note (Signed)
No improvement with loratadine or diflucan. Punch biopsy obtained today of leg lesion. I will call with the results.  Use OTC hydrocortisone or vaseline for itching.

## 2014-02-26 ENCOUNTER — Ambulatory Visit: Payer: Self-pay | Admitting: Family Medicine

## 2014-03-04 ENCOUNTER — Telehealth: Payer: Self-pay | Admitting: Emergency Medicine

## 2014-03-04 NOTE — Telephone Encounter (Signed)
Left voicemail for patient.  Informed her that her biopsy results are back and I would like to discuss them with her.  She should leave several times today and tomorrow that would be convenient for me to call her.

## 2014-03-08 ENCOUNTER — Other Ambulatory Visit: Payer: Self-pay | Admitting: Emergency Medicine

## 2014-03-08 ENCOUNTER — Encounter: Payer: Self-pay | Admitting: *Deleted

## 2014-03-08 MED ORDER — FLUOCINONIDE 0.05 % EX CREA: 1.0000 "application " | TOPICAL_CREAM | Freq: Two times a day (BID) | CUTANEOUS | Status: DC

## 2014-03-08 MED ORDER — FLUOCINONIDE 0.05 % EX OINT: 1.0000 "application " | TOPICAL_OINTMENT | Freq: Two times a day (BID) | CUTANEOUS | Status: DC

## 2014-03-08 NOTE — Addendum Note (Signed)
Addended by: Melony Overly on: 03/08/2014 11:12 AM   Modules accepted: Orders

## 2014-03-08 NOTE — Progress Notes (Signed)
Patient ID: Kristina Moreno, female   DOB: [birth year], 48 y.o.   MRN: [id number] Prescription changed to generic lidex cream which is on the preferred list.

## 2014-03-08 NOTE — Telephone Encounter (Signed)
Called and discussed biopsy result with patient.  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  Discussed that treatment for this is high potency topical steroids.  Prescription for Lidex 0.05% ointment sent to pharmacy.  Apply to affected areas BID x4 weeks.  If no improvement after 1 week, she will call me and I will refer her to dermatology.

## 2014-03-08 NOTE — Telephone Encounter (Signed)
Pt called. She is available today and tomorrow to talk with dr Bridgett Larsson

## 2014-03-08 NOTE — Progress Notes (Signed)
Prior Authorization received from [facility] for Fluocinonide 0.05% ointment. Formulary and PA form placed in provider box for completion. Fluocinonide cream or gel is preferred. Derl Barrow, RN

## 2014-03-10 ENCOUNTER — Ambulatory Visit (HOSPITAL_COMMUNITY): Payer: Self-pay | Admitting: Psychiatry

## 2014-03-16 ENCOUNTER — Ambulatory Visit (INDEPENDENT_AMBULATORY_CARE_PROVIDER_SITE_OTHER): Payer: Medicaid Other | Admitting: Psychiatry

## 2014-03-16 ENCOUNTER — Encounter (HOSPITAL_COMMUNITY): Payer: Self-pay | Admitting: Psychiatry

## 2014-03-16 VITALS — BP 117/67 | HR 75 | Ht 65.0 in | Wt 166.6 lb

## 2014-03-16 DIAGNOSIS — F1210 Cannabis abuse, uncomplicated: Secondary | ICD-10-CM

## 2014-03-16 DIAGNOSIS — F319 Bipolar disorder, unspecified: Secondary | ICD-10-CM

## 2014-03-16 DIAGNOSIS — F1010 Alcohol abuse, uncomplicated: Secondary | ICD-10-CM

## 2014-03-16 MED ORDER — ARIPIPRAZOLE 5 MG PO TABS: 5.0000 mg | ORAL_TABLET | Freq: Every day | ORAL | Status: DC
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
## 2014-04-08 ENCOUNTER — Ambulatory Visit (INDEPENDENT_AMBULATORY_CARE_PROVIDER_SITE_OTHER): Payer: Medicaid Other | Admitting: Family Medicine

## 2014-04-08 ENCOUNTER — Encounter: Payer: Self-pay | Admitting: Family Medicine

## 2014-04-08 VITALS — BP 105/72 | HR 73 | Temp 97.9°F | Ht 65.0 in | Wt 168.0 lb

## 2014-04-08 DIAGNOSIS — R21 Rash and other nonspecific skin eruption: Secondary | ICD-10-CM

## 2014-04-08 NOTE — Assessment & Plan Note (Signed)
Rash on foot appears to be similar than previous rashes. No evidence of cellulitis. Possibly contact dermatitis.  - recommended application of lidex to area bid for 3 weeks - refer to dermatology since rash has recurred on multiple occasions.

## 2014-04-08 NOTE — Patient Instructions (Signed)
We are going to get you set up for dermatology.

## 2014-04-08 NOTE — Progress Notes (Signed)
Patient ID: Kristina Moreno    DOB: [birth year], 48 y.o.   MRN: [id number] --- Subjective:  Kristina Moreno is a 48 y.o.female who presents for evaluation of rash. Patient noticed a red "water blister" appearing area on her right foot yesterday. Area is itchy and burning. It is getting slightly larger in size.  She was evaluated recently for a similar rash on her legs as well as her fingers. 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 She was prescribed lidex 0.05% which helped patches resolve.  Patient states that current patch is similar than previous. She started applying lidex to it last night.  She denies any fevers, any change in soaps or detergents.   ROS: see HPI Past Medical History: reviewed and updated medications and allergies. Social History: Tobacco: former smoker  Objective: Filed Vitals:   04/08/14 1023  BP: 105/72  Pulse: 73  Temp: 97.9 F (36.6 C)    Physical Examination:   General appearance - alert, well appearing, and in no distress Skin - vesicular erythematous patch of 1x0.5cm with adjacent 0.3x0.3cm red vesicular lesion, located on medial aspect of right foot.

## 2014-04-28 ENCOUNTER — Encounter (INDEPENDENT_AMBULATORY_CARE_PROVIDER_SITE_OTHER): Payer: Self-pay

## 2014-04-28 ENCOUNTER — Encounter (HOSPITAL_COMMUNITY): Payer: Self-pay | Admitting: Psychiatry

## 2014-04-28 ENCOUNTER — Ambulatory Visit (INDEPENDENT_AMBULATORY_CARE_PROVIDER_SITE_OTHER): Payer: Medicaid Other | Admitting: Psychiatry

## 2014-04-28 VITALS — BP 118/65 | HR 77 | Ht 65.0 in | Wt 169.0 lb

## 2014-04-28 DIAGNOSIS — F319 Bipolar disorder, unspecified: Secondary | ICD-10-CM

## 2014-04-28 DIAGNOSIS — F1010 Alcohol abuse, uncomplicated: Secondary | ICD-10-CM

## 2014-04-28 DIAGNOSIS — F1210 Cannabis abuse, uncomplicated: Secondary | ICD-10-CM
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
## 2014-05-20 ENCOUNTER — Ambulatory Visit (HOSPITAL_COMMUNITY): Payer: Self-pay | Admitting: Psychiatry

## 2014-05-25 ENCOUNTER — Telehealth: Payer: Self-pay | Admitting: Family Medicine

## 2014-05-25 MED ORDER — HYDROCHLOROTHIAZIDE 12.5 MG PO CAPS: 12.5000 mg | ORAL_CAPSULE | Freq: Every day | ORAL | Status: DC

## 2014-05-25 NOTE — Telephone Encounter (Signed)
Pt left voice message on nurse line stating she needed her medication refilled.  Her ears are popping and her appt is already scheduled.  She called [facility] and they told her to call for an appt.  Derl Barrow, RN

## 2014-05-25 NOTE — Telephone Encounter (Signed)
Reviewed chart, sent refill for HCTZ 12.5mg  daily to [facility]. Noted follow-up scheduled with me on 06/02/14.  Please call back patient to let her know it has been sent in, and to check with the pharmacy.  Thank you, Nobie Putnam, [place], PGY-2

## 2014-05-25 NOTE — Telephone Encounter (Signed)
Patient states that her HCTZ was denied because she needed to make an appt. She has made an appt for 8/12 but she will need meds called in today. Please let patient once meds been called in.

## 2014-05-25 NOTE — Telephone Encounter (Signed)
Left voice message for pt regarding medication refill.  Derl Barrow, RN

## 2014-05-27 ENCOUNTER — Ambulatory Visit (INDEPENDENT_AMBULATORY_CARE_PROVIDER_SITE_OTHER): Payer: Medicaid Other | Admitting: Psychiatry

## 2014-05-27 ENCOUNTER — Encounter (HOSPITAL_COMMUNITY): Payer: Self-pay | Admitting: Psychiatry

## 2014-05-27 VITALS — BP 112/75 | HR 64 | Wt 170.0 lb

## 2014-05-27 DIAGNOSIS — F319 Bipolar disorder, unspecified: Secondary | ICD-10-CM

## 2014-05-27 MED ORDER — BENZTROPINE MESYLATE 0.5 MG PO TABS: 0.5000 mg | ORAL_TABLET | Freq: Every day | ORAL | Status: DC

## 2014-05-27 MED ORDER — ARIPIPRAZOLE 5 MG PO TABS: 5.0000 mg | ORAL_TABLET | Freq: Every day | ORAL | Status: DC
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
## 2014-06-02 ENCOUNTER — Ambulatory Visit: Payer: Self-pay | Admitting: Family Medicine

## 2014-06-15 ENCOUNTER — Ambulatory Visit (INDEPENDENT_AMBULATORY_CARE_PROVIDER_SITE_OTHER): Payer: Medicaid Other | Admitting: Psychiatry

## 2014-06-15 DIAGNOSIS — F319 Bipolar disorder, unspecified: Secondary | ICD-10-CM

## 2014-06-16 NOTE — Progress Notes (Signed)
   THERAPIST PROGRESS NOTE  Session Time: 2:00-3:00  Participation Level: Active  Behavioral Response: CasualAlertEuthymic  Type of Therapy: Individual Therapy  Treatment Goals addressed: Anger/Irritability  Interventions: CBT, Strength-based and Supportive  Summary: Kristina Moreno is a 48 y.o. female who presents with Bipolar Disorder.   Suicidal/Homicidal: Nowithout intent/plan  Therapist Response: Pt. Reports that she has been managing irritability more effectively. Pt. Reports that she placed more rigid boundaries in relationship with oldest son and daughter in-law such that she requires that they be more respectful of her time and allow her to keep her granddaughter on a schedule that is convenient for her. Pt. Reports that she is concerned about weight gain that she attributes to her medication. However Pt. Admits that she is also going through menopause and she stopped her habit of daily walking because of the heat. Pt. Reports that she continues to eat a healthy diet and that she goes out and dances at least once a week. Most of session spent discussing Pt.'s concerns about her 56 year old son who she describes as younger developmentally than his chronological age and requires her to provide what she believes to be excessive care-giving. Pt. Is aware of her pattern of providing excessive care-giving in relationships with both of her children and sees how both of her sons have become overly dependent on her and the women in their lives. Pt. Reports that she continues to take xanax daily for anxiety which she has been taking for about 2 years and does not believe that she can manage her anxiety without it. Pt. Also reports that she uses marijuana daily but has cut back on her use. Pt. Reports that she gets very sleepy in the afternoon and sleeps for 2-3 hours. Discussed how lengthy nap in the afternoon is likely interfering with sleep at night. Pt. Discussed that she is sometimes irritable  with family and friends but is more aware of situations that trigger her anger and at this time sees the only consequence has been difficulty maintaining significant relationship which she desires. Meditation and breathing instruction were provided and Pt. Participated in meditation exercise.   Plan:  Continue with CBT based therapy. Return again in 2-4 weeks.  Diagnosis: Axis I: Bipolar, Depressed and Bipolar, Manic    Axis II: No diagnosis    Oluwademilade, [place] 06/16/2014

## 2014-06-23 ENCOUNTER — Ambulatory Visit: Payer: Self-pay | Admitting: Family Medicine

## 2014-06-29 ENCOUNTER — Encounter: Payer: Self-pay | Admitting: Family Medicine

## 2014-06-29 ENCOUNTER — Ambulatory Visit (INDEPENDENT_AMBULATORY_CARE_PROVIDER_SITE_OTHER): Payer: Medicaid Other | Admitting: Family Medicine

## 2014-06-29 VITALS — BP 103/66 | HR 76 | Temp 98.4°F | Ht 65.0 in | Wt 164.3 lb

## 2014-06-29 DIAGNOSIS — R21 Rash and other nonspecific skin eruption: Secondary | ICD-10-CM

## 2014-06-29 DIAGNOSIS — H8109 Meniere's disease, unspecified ear: Secondary | ICD-10-CM

## 2014-06-29 DIAGNOSIS — H8103 Meniere's disease, bilateral: Secondary | ICD-10-CM

## 2014-06-29 DIAGNOSIS — F411 Generalized anxiety disorder: Secondary | ICD-10-CM

## 2014-06-29 DIAGNOSIS — F1210 Cannabis abuse, uncomplicated: Secondary | ICD-10-CM

## 2014-06-29 LAB — BASIC METABOLIC PANEL
BUN: 16 mg/dL (ref 6–23)
CO2: 27 mEq/L (ref 19–32)
Calcium: 10.2 mg/dL (ref 8.4–10.5)
Chloride: 104 mEq/L (ref 96–112)
Creat: 0.96 mg/dL (ref 0.50–1.10)
Glucose, Bld: 94 mg/dL (ref 70–99)
POTASSIUM: 4.2 meq/L (ref 3.5–5.3)
SODIUM: 137 meq/L (ref 135–145)

## 2014-06-29 MED ORDER — HYDROCHLOROTHIAZIDE 12.5 MG PO CAPS: 12.5000 mg | ORAL_CAPSULE | Freq: Every day | ORAL | Status: DC

## 2014-06-29 NOTE — Assessment & Plan Note (Addendum)
Improved, controlled - Followed by [facility]: Psychiatry and Therapy - Meds: Abilify 5mg  daily, Cogentin 0.5mg  qhs - Stable mood, no recent flares, no suicidal or homicidal ideation  Plan: 1. Continue regular follow-up at [facility] 2. Continue Abilify and Cogentin 3. Continue Xanax 0.5mg  daily PRN (reduced dose), does not need new rx today, will call when running low, plans to taper off eventually 4. Advised on cutting back substance use for optimal mood improvement

## 2014-06-29 NOTE — Progress Notes (Signed)
   Subjective:    Patient ID: Kristina Moreno, female    DOB: [birth year], 48 y.o.   MRN: [id number]  HPI  Bipolar Type 1 Mood Disorder / Anxiety: - Reports following regularly with [facility], admits currently taking Abilify 5mg  daily and Cogentin 0.5mg  QHS as well. Overall doing much better since initiating treatment. Reports she has been receiving regular counseling, next visit to be joined by family members. - Currently taking half tab Xanax 0.5mg  daily in AM (total dose 0.25mg  daily), with plans to eventually come off of Xanax, states hopes that "Abilify continues to work" - Through counseling advised to pick-up some hobbies, states she is planning on joining local dance group, for increased activity  CHRONIC HTN: Reports - no concerns Current Meds - HCTZ 12.5mg  daily   Reports good compliance, took meds today. Tolerating well, w/o complaints. Lifestyle - no regular physical activity Denies CP, dyspnea, HA, edema, dizziness / lightheadedness  Meniere's Disease - Reports under control since on HCTZ, no headaches. Requesting refill HCTZ, states insurance says she had to come in for visit for this to be covered - Feels occasional "ear popping" but overall doing well. States she hasn't been [facility] since last November. - Denies tinnitus, ear pain, vertigo, dizziness  Rash, right lower leg - Using daily moisturizer, states missed last Dermatology apt at [facility]), re-scheduled for November - Currently without significant rash today, states it has "come and gone", but would like to get it checked out. She states it is not currently bothering her. - Denies pain, itching, drainage, spread worsening, fevers  Social Hx -  - Working at [employer] 3x daily - Reduced EtOH (states twice weekly and weekends, mostly beer) - Reduced amount, but states still smoking marijuana - Regularly baby-sits her granddaughter  Review of Systems  See above HPI    Objective:   Physical Exam  BP 103/66  Pulse 76  Temp(Src) 98.4 F (36.9 C) (Oral)  Ht 5\' 5"  (1.651 m)  Wt 164 lb 4.8 oz (74.526 kg)  BMI 27.34 kg/m2  Gen - well-appearing, pleasant, NAD HEENT - bilateral TM's normal, oropharynx clear, MMM Neck - supple, non-tender Heart - RRR, no murmurs heard Lungs - CTAB, no wheezing, crackles, or rhonchi. Normal work of breathing. Ext - non-tender, no edema, peripheral pulses intact +2 b/l Neuro - awake, alert Skin - RLE without significant evidence of rash or erythema. Otherwise skin warm, dry Psych - normal speech and thought content, without pressured speech, normal mood     Assessment & Plan:   See specific A&P problem list for details.

## 2014-06-29 NOTE — Assessment & Plan Note (Signed)
Improved, no significant rash or findings on exam today - Hx with intermittent rash  Plan: 1. Continue daily moisturizer 2. Recommend attending next Derm appointment (missed last one due to not having medicaid card with her), scheduled for Nov 2015

## 2014-06-29 NOTE — Assessment & Plan Note (Signed)
Active smoker -  Currently reducing amount using, but still admitting to regular use  Plan: 1. Counseled on benefits of quitting, improved mood stability

## 2014-06-29 NOTE — Patient Instructions (Addendum)
Dear Kristina Moreno, Thank you for coming in to clinic today. It was good to see you!  Today we discussed your Anxiety, Blood Pressure and Meniere's Disease. 1. I'm glad to hear that you are doing well. 2. Continue following with [facility] and your therapist there. 3. Continue Abilify and Cogentin daily 4. Blood pressure is well controlled, and seems like HCTZ is controlling your Meniere's disease as well. Refilled for 1 year. 5. Please call when you are running low on your Xanax, and I can call in a refill for you. May be able to come off of this in the future. 6. Continue to work on reducing alcohol and smoking!  Remember your Dermatology appointment in November, bring your Medicaid Card.  Remember to schedule your Mammogram and Pap Smear  Some important numbers from today's visit: BP - 103/66  We will notify you of your lab work results, by phone or by mail.  Please schedule a follow-up appointment with me (Dr. Parks Ranger) in 1 year, schedule follow-up sooner as needed.  If you have any other questions or concerns, please feel free to call the clinic to contact me. You may also schedule an earlier appointment if necessary.  However, if your symptoms get significantly worse, please go to the Emergency Department to seek immediate medical attention.  Nobie Putnam, [place]

## 2014-06-29 NOTE — Assessment & Plan Note (Signed)
Stable, controlled on HCTZ 12.5mg  daily No recent flares or complications  Plan: 1. Continue HCTZ, sent refill x 1 year, BP stable 2. Ordered BMET, check Cr and electrolytes with continuing thiazide 3. Recommend continue to f/u [facility], pt to call and schedule for routine eval

## 2014-06-30 ENCOUNTER — Encounter: Payer: Self-pay | Admitting: Family Medicine

## 2014-07-14 ENCOUNTER — Other Ambulatory Visit (HOSPITAL_COMMUNITY): Payer: Self-pay | Admitting: Psychiatry

## 2014-07-19 ENCOUNTER — Other Ambulatory Visit: Payer: Self-pay | Admitting: Family Medicine

## 2014-07-19 DIAGNOSIS — Z1231 Encounter for screening mammogram for malignant neoplasm of breast: Secondary | ICD-10-CM

## 2014-07-27 ENCOUNTER — Ambulatory Visit (HOSPITAL_COMMUNITY): Payer: Self-pay | Admitting: Psychiatry

## 2014-08-02 ENCOUNTER — Ambulatory Visit (INDEPENDENT_AMBULATORY_CARE_PROVIDER_SITE_OTHER): Payer: Medicaid Other | Admitting: Psychiatry

## 2014-08-02 ENCOUNTER — Encounter (HOSPITAL_COMMUNITY): Payer: Self-pay | Admitting: Psychiatry

## 2014-08-02 VITALS — BP 118/84 | HR 74 | Ht 65.0 in | Wt 166.8 lb

## 2014-08-02 DIAGNOSIS — F1910 Other psychoactive substance abuse, uncomplicated: Secondary | ICD-10-CM

## 2014-08-02 DIAGNOSIS — F319 Bipolar disorder, unspecified: Secondary | ICD-10-CM

## 2014-08-02 DIAGNOSIS — F1010 Alcohol abuse, uncomplicated: Secondary | ICD-10-CM

## 2014-08-02 MED ORDER — ARIPIPRAZOLE 5 MG PO TABS: 5.0000 mg | ORAL_TABLET | Freq: Every day | ORAL | Status: DC
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
## 2014-08-17 ENCOUNTER — Ambulatory Visit (INDEPENDENT_AMBULATORY_CARE_PROVIDER_SITE_OTHER): Payer: Medicaid Other | Admitting: *Deleted

## 2014-08-17 DIAGNOSIS — Z23 Encounter for immunization: Secondary | ICD-10-CM

## 2014-08-23 ENCOUNTER — Encounter (HOSPITAL_COMMUNITY): Payer: Self-pay | Admitting: Psychiatry

## 2014-08-25 ENCOUNTER — Encounter: Payer: Self-pay | Admitting: Obstetrics & Gynecology

## 2014-08-25 ENCOUNTER — Ambulatory Visit: Payer: Medicaid Other | Admitting: Obstetrics & Gynecology

## 2014-08-25 DIAGNOSIS — Z01419 Encounter for gynecological examination (general) (routine) without abnormal findings: Secondary | ICD-10-CM

## 2014-08-25 NOTE — Progress Notes (Signed)
Patient here for pap smear. Had TAH in 2007, benign pathology. No indication for pap smear.  Surgical Pathology 04/03/2006  UTERUS AND CERVIX: - CERVIX: SQUAMOUS METAPLASIA. NO DYSPLASIA OR CARCINOMA  IDENTIFIED. - WEAKLY PROLIFERATIVE ENDOMETRIUM. NO HYPERPLASIA ORCARCINOMA IDENTIFIED. - LEIOMYOMATA. Patient Gross Description Specimen: Uterus in multiple pieces, including one with cervix.Size and shape: The specimen has an aggregate measurement of 15.3x 11.7 x 5.1 cmWeight: 436 grams Serosa: There is pink to hyperemic smooth serosa with onepiece having two subserosal leiomyomas, 1.1, and 1.4 cm in diameter. Cervix: 5 cm in length, 3.1 cm in diameter, has a tan-whitesmooth ectocervix and smooth endocervix. Endometrium: There is tan-red smooth, soft endometrium which is up to 0.1 cm thick. One area has a 3.1 cm in diameter submucosalleiomyoma. Myometrium: Tan-pink to hyperemic, and distorted by multipleintramural leiomyomas, which are up to 5 cm in diameter. Right adnexa: Not included   Left adnexa: Not included.  Patient did not wish to proceed with visit once she was informed that pap will not be done.   Verita Schneiders, MD, [place] Attending [place] for [employer], [place]

## 2014-08-25 NOTE — Patient Instructions (Signed)
Return to clinic for any scheduled appointments or for any gynecologic concerns as needed.   

## 2014-08-31 ENCOUNTER — Other Ambulatory Visit: Payer: Self-pay | Admitting: Family Medicine

## 2014-09-13 ENCOUNTER — Other Ambulatory Visit: Payer: Self-pay | Admitting: Family Medicine

## 2014-09-28 ENCOUNTER — Ambulatory Visit (HOSPITAL_COMMUNITY): Payer: Self-pay | Admitting: Psychiatry

## 2014-10-05 ENCOUNTER — Ambulatory Visit (HOSPITAL_COMMUNITY)
Admission: RE | Admit: 2014-10-05 | Discharge: 2014-10-05 | Disposition: A | Discharge status: Home / Self Care | Payer: Medicaid Other | Source: Ambulatory Visit | Attending: Family Medicine | Admitting: Family Medicine

## 2014-10-05 DIAGNOSIS — Z1231 Encounter for screening mammogram for malignant neoplasm of breast: Secondary | ICD-10-CM | POA: Insufficient documentation

## 2014-11-08 ENCOUNTER — Ambulatory Visit (INDEPENDENT_AMBULATORY_CARE_PROVIDER_SITE_OTHER): Payer: Medicaid Other | Admitting: Psychiatry

## 2014-11-08 ENCOUNTER — Encounter (HOSPITAL_COMMUNITY): Payer: Self-pay | Admitting: Psychiatry

## 2014-11-08 VITALS — BP 121/75 | HR 74 | Ht 65.0 in | Wt 172.2 lb

## 2014-11-08 DIAGNOSIS — F319 Bipolar disorder, unspecified: Secondary | ICD-10-CM

## 2014-11-08 DIAGNOSIS — F1290 Cannabis use, unspecified, uncomplicated: Secondary | ICD-10-CM

## 2014-11-08 DIAGNOSIS — F1010 Alcohol abuse, uncomplicated: Secondary | ICD-10-CM

## 2014-11-08 MED ORDER — ARIPIPRAZOLE 5 MG PO TABS: 5.0000 mg | ORAL_TABLET | Freq: Every day | ORAL | Status: DC
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
## 2014-11-30 ENCOUNTER — Telehealth: Payer: Self-pay | Admitting: Family Medicine

## 2014-11-30 DIAGNOSIS — Z23 Encounter for immunization: Secondary | ICD-10-CM

## 2014-11-30 NOTE — Telephone Encounter (Signed)
Pt called and would like a shingles shot. Can we send a prescription to her pharmacy. jw

## 2014-12-01 MED ORDER — ZOSTER VACCINE LIVE 19400 UNT/0.65ML ~~LOC~~ SOLR: 0.6500 mL | Freq: Once | SUBCUTANEOUS | Status: DC

## 2014-12-01 NOTE — Telephone Encounter (Signed)
Ordered Zostavax vaccine - e-scribe to [facility].  Nobie Putnam, [place], PGY-2

## 2014-12-02 ENCOUNTER — Other Ambulatory Visit: Payer: Self-pay | Admitting: Family Medicine

## 2014-12-02 DIAGNOSIS — F411 Generalized anxiety disorder: Secondary | ICD-10-CM

## 2014-12-22 ENCOUNTER — Ambulatory Visit (INDEPENDENT_AMBULATORY_CARE_PROVIDER_SITE_OTHER): Payer: Medicaid Other | Admitting: Psychiatry

## 2014-12-22 ENCOUNTER — Encounter (HOSPITAL_COMMUNITY): Payer: Self-pay

## 2014-12-22 ENCOUNTER — Emergency Department (INDEPENDENT_AMBULATORY_CARE_PROVIDER_SITE_OTHER)
Admission: EM | Admit: 2014-12-22 | Discharge: 2014-12-22 | Disposition: A | Discharge status: Home / Self Care | Payer: Medicaid Other | Source: Home / Self Care | Attending: Emergency Medicine | Admitting: Emergency Medicine

## 2014-12-22 DIAGNOSIS — R195 Other fecal abnormalities: Secondary | ICD-10-CM | POA: Diagnosis not present

## 2014-12-22 DIAGNOSIS — F319 Bipolar disorder, unspecified: Secondary | ICD-10-CM

## 2014-12-22 MED ORDER — ONDANSETRON HCL 4 MG PO TABS: 4.0000 mg | ORAL_TABLET | Freq: Three times a day (TID) | ORAL | Status: DC | PRN

## 2014-12-22 MED ORDER — OMEPRAZOLE 20 MG PO CPDR: 20.0000 mg | DELAYED_RELEASE_CAPSULE | Freq: Every day | ORAL | Status: DC

## 2014-12-22 NOTE — ED Provider Notes (Signed)
CSN: [id number]     Arrival date & time 12/22/14  0801 History   First MD Initiated Contact with Patient 12/22/14 [phone]     Chief Complaint  Patient presents with  . Nausea   (Consider location/radiation/quality/duration/timing/severity/associated sxs/prior Treatment) HPI  She is a 49 year old woman here for evaluation of dark stools. She states one week ago she woke up with nausea, vomiting, diarrhea. She reports subjective fevers and chills. All of her symptoms have resolved except for some lingering nausea and dark stools. She describes the stool as black. It is formed stool.  She does report a history of hemorrhoids, but denies any bright red blood. She denies any abdominal pain. She does report some increased burping and gassiness. No history of reflux or heartburn. She has been taking Pepto-Bismol with minimal improvement of nausea.  No fevers or chills. No dizziness.  Past Medical History  Diagnosis Date  . Anxiety   . Asthma   . Meniere disease   . COPD (chronic obstructive pulmonary disease)   . Contact dermatitis    Past Surgical History  Procedure Laterality Date  . Total abdominal hysterectomy  04/03/2006    Pathology revealed benign uterus and cervix   Family History  Problem Relation Age of Onset  . Anesthesia problems Neg Hx   . Hypotension Neg Hx   . Malignant hyperthermia Neg Hx   . Pseudochol deficiency Neg Hx    History  Substance Use Topics  . Smoking status: Former Research scientist (life sciences)  . Smokeless tobacco: Never Used  . Alcohol Use: 0.0 oz/week    0 Standard drinks or equivalent per week     Comment: 40 oz beer "a couple times a week."   OB History    Gravida Para Term Preterm AB TAB SAB Ectopic Multiple Living   3 2 2  1 1    2      Review of Systems  Constitutional: Negative for fever, chills and appetite change.  Gastrointestinal: Positive for nausea and blood in stool (dark stool). Negative for vomiting, abdominal pain, diarrhea and constipation.  Neurological:  Negative for dizziness.    Allergies  Review of patient's allergies indicates no known allergies.  Home Medications   Prior to Admission medications   Medication Sig Start Date End Date Taking? Authorizing Provider  ALPRAZolam Duanne Moron) 0.5 MG tablet TAKE 1/2 TABLET BY MOUTH THREE TIMES DAILY AS NEEDED FOR ANXIETY 12/03/14   Nobie Putnam, DO  ARIPiprazole (ABILIFY) 5 MG tablet Take 1 tablet (5 mg total) by mouth daily. 11/08/14 11/08/15  Kathlee Nations, MD  fluocinonide cream (LIDEX) 7.62 % Apply 1 application topically 2 (two) times daily. 03/08/14   Melony Overly, MD  hydrochlorothiazide (MICROZIDE) 12.5 MG capsule Take 1 capsule (12.5 mg total) by mouth daily. 06/29/14   Nobie Putnam, DO  hydrocortisone cream 1 % Apply topically 2 (two) times daily. 09/16/12   Seabron Spates, CNM  loratadine (CLARITIN) 10 MG tablet Take 1 tablet (10 mg total) by mouth daily. 01/28/14   Melony Overly, MD  Multiple Vitamins-Minerals (MULTIVITAMIN WITH MINERALS) tablet Take 1 tablet by mouth daily.    Historical Provider, MD  omeprazole (PRILOSEC) 20 MG capsule Take 1 capsule (20 mg total) by mouth daily. 12/22/14   Melony Overly, MD  ondansetron (ZOFRAN) 4 MG tablet Take 1 tablet (4 mg total) by mouth every 8 (eight) hours as needed for nausea or vomiting. 12/22/14   Melony Overly, MD  PROAIR HFA 108 (58 BASE)  MCG/ACT inhaler INHALE 2 PUFFS INTO THE LUNGS EVERY 6 HOURS AS NEEDED FOR SHORTNESS OF BREATH/ASTHMA 09/13/14   Nobie Putnam, DO  triamcinolone cream (KENALOG) 0.1 % Apply 1 application topically 2 (two) times daily. 01/11/14   Dayarmys [place] de Gwendalyn Ege, MD  zoster vaccine live, PF, (ZOSTAVAX) 93734 UNT/0.65ML injection Inject 19,400 Units into the skin once. 12/01/14   Nobie Putnam, DO   BP 121/90 mmHg  Pulse 72  Temp(Src) 98.9 F (37.2 C) (Oral)  Resp 14  SpO2 100% Physical Exam  Constitutional: She is oriented to person, place, and time. She appears well-developed and  well-nourished. No distress.  Cardiovascular: Normal rate.   Pulmonary/Chest: Effort normal.  Abdominal: Soft. Bowel sounds are normal. She exhibits no distension and no mass. There is no tenderness. There is no rebound and no guarding.  Genitourinary: Rectal exam shows external hemorrhoid. Rectal exam shows no internal hemorrhoid, no fissure, no mass, no tenderness and anal tone normal. Guaiac negative stool.  Neurological: She is alert and oriented to person, place, and time.    ED Course  Procedures (including critical care time) Labs Review Labs Reviewed - No data to display  Imaging Review No results found.   MDM   1. Dark stools    I suspect this is some lingering GI irritation/inflammation from what sounds like a stomach virus. The Pepto-Bismol may also be contributing to continued discoloration of her stool. Fecal occult blood test is negative. Will treat with Zofran for nausea and 2 weeks of omeprazole to help resolve any lingering inflammation. If no improvement in the next week, she will follow-up here or with her PCP. She does have a physical scheduled for later this month to discuss colonoscopy.    Melony Overly, MD 12/22/14 [phone]

## 2014-12-22 NOTE — ED Notes (Signed)
C/o black stools for a couple of days prior to reported 5 day duration of nausea. Began taking Pepto when she developed nausea. Denies pain. Continued black stools

## 2014-12-22 NOTE — Discharge Instructions (Signed)
There is no blood in your stool. The discoloration is likely due to residual irritation of your stomach. Take omeprazole 1 pill daily for the next 2 weeks. Use Zofran every 8 hours as needed for nausea. Avoid things that can irritate the stomach. This includes beer, liquor, smoking, spicy foods, greasy foods, citrus. You should see improvement in the next week. Follow-up with your primary care physician if things are not improving. You should see your primary care physician soon to talk about a screening colonoscopy.

## 2014-12-24 NOTE — Progress Notes (Signed)
   THERAPIST PROGRESS NOTE  Session Time: 3:30-4:30  Participation Level: Active  Behavioral Response: CasualAlertEuthymic  Type of Therapy: Individual Therapy  Treatment Goals addressed: Anger/Irritability  Interventions: CBT, Strength-based and Supportive  Summary: Kristina Moreno is a 49 y.o. female who presents with Bipolar Disorder.   Suicidal/Homicidal: Nowithout intent/plan  Therapist Response: Pt. Reports that she continues to manage irritability effectively. Pt. Reports that coping strategy of viewing her emotions on clouds that are passing has been helpful for her. Pt. Reports that she has been successful in setting healthier boundaries with her daughter-in-law and enjoys spending time with her granddaughter. Pt. Reports that she had significant birthday since our last session turning 49 years old. Pt. Reports that she feels healthy and vital, dances regularly and eats healthy diet, complains of minor knee pain. Pt. Reports that she enjoys her job and manages job related stress well. 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 Pt. Discussed significant challenge is finding significant other and wants to broaden her options by going to new places and opening herself to dating younger men.    Plan: Continue with CBT based therapy. Return again in 2-4 weeks.  Diagnosis:Axis I: Bipolar, Depressed and Bipolar, Manic  Axis II: No diagnosis   Kristina Moreno, Kristina Moreno, Kristina Moreno [place] 12/24/2014

## 2015-01-05 ENCOUNTER — Other Ambulatory Visit: Payer: Self-pay | Admitting: Family Medicine

## 2015-01-05 ENCOUNTER — Ambulatory Visit (INDEPENDENT_AMBULATORY_CARE_PROVIDER_SITE_OTHER): Payer: Medicaid Other | Admitting: Family Medicine

## 2015-01-05 ENCOUNTER — Encounter: Payer: Self-pay | Admitting: Family Medicine

## 2015-01-05 VITALS — BP 95/67 | HR 91 | Temp 98.1°F | Ht 65.0 in | Wt 168.6 lb

## 2015-01-05 DIAGNOSIS — H8103 Meniere's disease, bilateral: Secondary | ICD-10-CM

## 2015-01-05 DIAGNOSIS — F411 Generalized anxiety disorder: Secondary | ICD-10-CM

## 2015-01-05 DIAGNOSIS — J449 Chronic obstructive pulmonary disease, unspecified: Secondary | ICD-10-CM

## 2015-01-05 DIAGNOSIS — Z114 Encounter for screening for human immunodeficiency virus [HIV]: Secondary | ICD-10-CM

## 2015-01-05 DIAGNOSIS — F3162 Bipolar disorder, current episode mixed, moderate: Secondary | ICD-10-CM

## 2015-01-05 DIAGNOSIS — Z1211 Encounter for screening for malignant neoplasm of colon: Secondary | ICD-10-CM

## 2015-01-05 DIAGNOSIS — Z1159 Encounter for screening for other viral diseases: Secondary | ICD-10-CM

## 2015-01-05 LAB — HEPATITIS C ANTIBODY: HCV Ab: NEGATIVE

## 2015-01-05 MED ORDER — ALBUTEROL SULFATE HFA 108 (90 BASE) MCG/ACT IN AERS: INHALATION_SPRAY | RESPIRATORY_TRACT | Status: DC

## 2015-01-05 MED ORDER — ALPRAZOLAM 0.5 MG PO TABS
ORAL_TABLET | ORAL | Status: DC
Start: 2015-01-05 — End: 2015-05-19

## 2015-01-05 NOTE — Assessment & Plan Note (Addendum)
Routine screening colonoscopy requested, age 49, no prior colonoscopy. Recent episodes of dark stools 2 weeks ago with resolution. Known history of hemorrhoids, given recent course seemed to be likely self limited viral or colitis infection. - No family history of colon cancer - Currently asymptomatic  Plan: 1. Referral placed GI ([facility] - reportedly will schedule colonoscopy for medicaid pt's this month (March 2016) - confirmed pt referral in work queue. Discussed time sensitivity due to patient's potential loss of medicaid ins in April 2016. Handout given to pt for colonoscopy scheduling. If unable to schedule before medicaid runs out, would pursue self pay colonoscopy through [facility]) 2. Follow-up

## 2015-01-05 NOTE — Progress Notes (Signed)
I was the preceptor for this visit. 

## 2015-01-05 NOTE — Assessment & Plan Note (Signed)
Controlled on HCTZ 12.5mg  daily - Last visit [facility] >1 year ago  Plan: 1. Continue HCTZ 12.5mg  daily 2. Plan to follow-up with [facility] as needed

## 2015-01-05 NOTE — Patient Instructions (Signed)
Dear Kristina Moreno, Thank you for coming in to clinic today. It was good to see you!  1. Overall it sounds like you are doing well. 2. I encourage you to follow-up with [facility] and tell them about your Medicaid situation and see if there is any way to help afford your Abilify. Please tell them you stopped taking this, and seek help 3. For Colonoscopy - sent referral today. You should be called soon with more information. Call # on sheet next week if you have not heard back yet. 4. Refilled Xanax 3 months 5. Not due for Pap Smear anymore 6. Labs today - HIV, Hep C, Cholesterol  Thank you for scheduling yearly mammogram. All looks well  Some important numbers from today's visit: BP - 95/67  We will notify you of your lab work results, by phone or by mail.  Please schedule a follow-up appointment with me in 3 to 6 months for follow-up Anxiety.  If you have any other questions or concerns, please feel free to call the clinic to contact me. You may also schedule an earlier appointment if necessary.  However, if your symptoms get significantly worse, please go to the Emergency Department to seek immediate medical attention.  Kristina Moreno, [name]

## 2015-01-05 NOTE — Progress Notes (Signed)
   Subjective:    Patient ID: Kristina Moreno, female    DOB: 19-Apr-1966, 49 y.o.   MRN: [id number]  Patient presents for annual physical. HPI  DARK STOOLS / NAUSEA / DUE FOR COLON CA SCREENING: - Recently seen at [facility] on 3/2 for evaluation of nausea / vomiting and associated dark stools. Denied any bright red blood at that time. History of hemorrhoids. Tried peptobismol with some relief. On recent evaluation, rectal exam showed external hemorrhoid, no fissures, no active bleed. FOBT negative. Started on Zofran and Omeprazole x 2 weeks (however patient reports she never started these medications, as symptoms resolved following day) - Today states that her symptoms had resolved. No other family members with similar symptoms. No longer having dark stools. Currently bowel habits back to normal, regular BMs. - Due for colonoscopy (currently 49 yr old, no prior colonoscopy). States medicaid runs out April 30th - Family hx colon CA - none (father leukemia) - Denies any unintentional weight loss, fatigue, rectal bleeding or further bloody/dark stools, abdominal pain, diarrhea, or constipation  ANXIETY / Bipolar Type 1 Mood Disorder: - Reports following regularly with [facility] - Last visit 12/22/14. Next appointment is on February 07, 2015 - Currently taking Abilify 5mg  daily. However, she states that over past 2 weeks she has self discontinued this due to cost, and is concerned given her medicaid ins will run out in April. She stated that she "told [place] she was still taking this medication". - Continues to take Xanax 0.5mg  daily (0.25mg  BID) - Overall improved mood and controlled anxiety. Feels "more mellow". No concerns today, other than stress over loss of medicaid. Continues to work. - Denies any suicidal or homicidal ideation - Denies any sadness, depressed mood, anger or mood instability  Meniere's Disease - Previously followed by [facility] Dr. Kennon Rounds. Plans  to schedule follow-up (has been >1 year since last visit) - Currently controlled on low dose HCTZ. No concerns. No further headaches or hearing problems. - Denies tinnitus, ear pain, vertigo, dizziness  HM: - No longer indicated for future pap smears. S/p Total Abdominal Hysterectomy (03/2006), followed by GYN at [facility] saw Dr. Harolyn Rutherford 08/2014, see note and record for surgical pathology details. Benign pathology, no recommended further pap smears. - Due for routine HIV screening (no risk factors) - Requests Hepatitis C screening (no prior test, and given current age pt requested screening, denies history of blood transfusion or IV drug use)  Social Hx -  - Working at [employer] 3x daily - Currently drinking alcohol - Reduced amount, but states still smoking marijuana  Review of Systems  See above HPI    Objective:   Physical Exam  BP 95/67 mmHg  Pulse 91  Temp(Src) 98.1 F (36.7 C) (Oral)  Wt 168 lb 9.6 oz (76.476 kg)  Gen - well-appearing, pleasant, NAD HEENT - NCAT, PERRL, EOMI, patent nares w/o congestion, oropharynx clear, MMM Neck - supple, non-tender, no LAD Heart - RRR, no murmurs heard Lungs - CTAB, no wheezing, crackles, or rhonchi. Normal work of breathing. Abd - soft, NTND, no masses, +active BS Ext - non-tender, no edema, peripheral pulses intact +2 b/l Neuro - awake, alert, grossly non-focal Skin - warm, dry, no rashes Psych - normal speech and thought content, without pressured speech, normal mood     Assessment & Plan:   See specific A&P problem list for details.

## 2015-01-05 NOTE — Assessment & Plan Note (Signed)
Stable, controlled Anxiety  Plan: 1. Refilled Xanax 0.5mg  tabs - take half tab 0.25mg  TID PRN anxiety (refilled #45, 2 refills) 2. F/u with [facility]

## 2015-01-05 NOTE — Assessment & Plan Note (Addendum)
Stable mood. Controlled. However now concerns about pt self discontinuing Abilify due to cost concerns with Medicaid ins about to end in 01/2015. Followed by [facility] - Meds: Abilify 5mg  daily (currently stopped), Xanax 0.25mg  BID  Plan: 1. Recommend continue follow-up with [facility], and strongly advised to notify them that she has stopped Abilify. See if she can get any medication assistance from them or switch to affordable medication. 2. Refilled Xanax 0.25mg  TID x 3 month supply today 3. Smoking cessation, reduce substance use (marijuana and alcohol)

## 2015-01-06 LAB — HIV ANTIBODY (ROUTINE TESTING W REFLEX): HIV: NONREACTIVE

## 2015-02-07 ENCOUNTER — Ambulatory Visit (HOSPITAL_COMMUNITY): Payer: Self-pay | Admitting: Psychiatry

## 2015-02-07 ENCOUNTER — Telehealth: Payer: Self-pay | Admitting: Family Medicine

## 2015-02-07 DIAGNOSIS — J449 Chronic obstructive pulmonary disease, unspecified: Secondary | ICD-10-CM

## 2015-02-07 DIAGNOSIS — H8103 Meniere's disease, bilateral: Secondary | ICD-10-CM

## 2015-02-07 MED ORDER — ALBUTEROL SULFATE HFA 108 (90 BASE) MCG/ACT IN AERS: INHALATION_SPRAY | RESPIRATORY_TRACT | Status: DC

## 2015-02-07 MED ORDER — HYDROCHLOROTHIAZIDE 12.5 MG PO CAPS: 12.5000 mg | ORAL_CAPSULE | Freq: Every day | ORAL | Status: DC

## 2015-02-07 NOTE — Telephone Encounter (Signed)
Needs med refill on inhaler, xanax, and hydrochlorothiazide (MICROZIDE) 12.5 MG for up to 3 months. Pt is concerned because her medicaid is about to term and she needs enough of this medication to last her until she can figure something out with insurance / thanks [employer], ASA

## 2015-02-07 NOTE — Telephone Encounter (Signed)
Refilled Albuterol inhaler with refills, HCTZ 12.5mg  (#90 pills, 4 refills) sent refill. Confirmed that patient has existing Xanax 0.5mg  (take half tab TID PRN anxiety, #45 with 2 refills) to [place].  Nobie Putnam, [place], PGY-2

## 2015-02-17 ENCOUNTER — Ambulatory Visit (HOSPITAL_COMMUNITY): Payer: Self-pay | Admitting: Psychiatry

## 2015-02-21 ENCOUNTER — Ambulatory Visit: Payer: Self-pay

## 2015-03-14 ENCOUNTER — Encounter: Payer: Self-pay | Admitting: Gastroenterology

## 2015-04-19 ENCOUNTER — Encounter: Payer: Self-pay | Admitting: Family Medicine

## 2015-04-19 ENCOUNTER — Ambulatory Visit (INDEPENDENT_AMBULATORY_CARE_PROVIDER_SITE_OTHER): Payer: Self-pay | Admitting: Family Medicine

## 2015-04-19 VITALS — BP 108/86 | HR 95 | Temp 98.2°F | Ht 65.0 in | Wt 169.6 lb

## 2015-04-19 DIAGNOSIS — L239 Allergic contact dermatitis, unspecified cause: Secondary | ICD-10-CM

## 2015-04-19 MED ORDER — TRIAMCINOLONE ACETONIDE 0.1 % EX CREA: TOPICAL_CREAM | CUTANEOUS | Status: DC

## 2015-04-19 NOTE — Progress Notes (Signed)
  Subjective:     Kristina Moreno is a 49 y.o. female who presents for evaluation of a rash involving the R dorsum foot around 3rd MTP. Rash started 6 weeks ago. Lesions are vesicular/lichenified/crusted, and raised in texture. Rash has not changed over time. Rash is pruritic. Associated symptoms: none. Patient denies: arthralgia, congestion, fever and erythema/warmth to the area. Patient has not had contacts with similar rash. Patient has had new exposures (soaps, lotions, laundry detergents, foods, medications, plants, insects or animals).  The following portions of the patient's history were reviewed and updated as appropriate: allergies, current medications, past family history, past medical history, past social history, past surgical history and problem list.  Review of Systems Pertinent items are noted in HPI.    Objective:    BP 108/86 mmHg  Pulse 95  Temp(Src) 98.2 F (36.8 C) (Oral)  Ht 5\' 5"  (1.651 m)  Wt 169 lb 9.6 oz (76.93 kg)  BMI 28.22 kg/m2 General:  alert, cooperative and appears stated age  Skin:  0.5 by 0.5 cm raised, crusted vesicular lesion on the dorsum 3rd MTP, no surrouding erythema/warmth.  NV intact R foot     Assessment:    allergic contact dermatitis    Plan:    Medications: triamcinolone.   Eucerin to area If no improvement in 2 weeks after stopping wearing new sandals, consider Bx of lesion

## 2015-04-19 NOTE — Patient Instructions (Signed)

## 2015-05-18 ENCOUNTER — Other Ambulatory Visit: Payer: Self-pay | Admitting: Family Medicine

## 2015-05-18 DIAGNOSIS — F411 Generalized anxiety disorder: Secondary | ICD-10-CM

## 2015-05-18 NOTE — Telephone Encounter (Signed)
Pt needs refill on Xanax. Please contact the pt once completed if possible. Thank you, [name], ASA

## 2015-05-19 MED ORDER — ALPRAZOLAM 0.5 MG PO TABS
ORAL_TABLET | ORAL | Status: DC
Start: 2015-05-19 — End: 2015-09-08

## 2015-05-19 NOTE — Telephone Encounter (Signed)
Phoned rx into [facility], Alprazolam 0.5mg  tabs, take half TID PRN anxiety, #45, +2 refills.  Could you please call and notify patient that this was phoned in?  Thank you Nobie Putnam, [place], PGY-3

## 2015-05-19 NOTE — Telephone Encounter (Signed)
Left message on voicemail informing that rx had been called in.

## 2015-06-15 ENCOUNTER — Telehealth: Payer: Self-pay | Admitting: Family Medicine

## 2015-06-15 DIAGNOSIS — K029 Dental caries, unspecified: Secondary | ICD-10-CM

## 2015-06-15 NOTE — Telephone Encounter (Signed)
Please send referral to [facility].  Contact patient with appt info.

## 2015-06-16 NOTE — Telephone Encounter (Signed)
Referral entered. Please contact patient when complete.

## 2015-07-19 ENCOUNTER — Ambulatory Visit: Payer: Self-pay

## 2015-07-21 ENCOUNTER — Ambulatory Visit: Payer: Self-pay

## 2015-07-26 ENCOUNTER — Ambulatory Visit: Payer: Self-pay

## 2015-07-28 ENCOUNTER — Ambulatory Visit (INDEPENDENT_AMBULATORY_CARE_PROVIDER_SITE_OTHER): Payer: Self-pay | Admitting: *Deleted

## 2015-07-28 DIAGNOSIS — Z23 Encounter for immunization: Secondary | ICD-10-CM

## 2015-08-09 ENCOUNTER — Other Ambulatory Visit: Payer: Self-pay

## 2015-08-09 DIAGNOSIS — Z1231 Encounter for screening mammogram for malignant neoplasm of breast: Secondary | ICD-10-CM

## 2015-08-11 ENCOUNTER — Ambulatory Visit: Payer: Self-pay | Admitting: Family Medicine

## 2015-09-08 ENCOUNTER — Telehealth: Payer: Self-pay | Admitting: Family Medicine

## 2015-09-08 DIAGNOSIS — F411 Generalized anxiety disorder: Secondary | ICD-10-CM

## 2015-09-08 MED ORDER — ALPRAZOLAM 0.5 MG PO TABS: ORAL_TABLET | ORAL | Status: DC

## 2015-09-08 NOTE — Telephone Encounter (Signed)
Attempted to call patient. Unable to reach her. Left voicemail stating that I would agree to refill her requested medication and as she works on resolving her financial assistance plans. Advised her to call back and talk to [facility] admin/financial with further questions or concerns.  Phoned in refill to [place] for her current Alprazolam rx 0.5mg  tabs take 1/2 tab (0.25mg ) TID PRN anxiety #45 with +2 refills for 3 month.  Nobie Putnam, [place], PGY-3

## 2015-09-08 NOTE — Telephone Encounter (Signed)
We are currently working on getting somebody trained to do financial assistance since Pamala Hurry is out. Is there a way you can send her in a refill until then? Please advise. Deseree Kennon Holter, CMA

## 2015-09-08 NOTE — Telephone Encounter (Signed)
Pt calling and would like advice as to what she needs to do. Her orange card and F/A assistance has run out, and unfortunately we are unable to assist her with re-certifying just yet. She needs her medication, Xanax, and would like to know what resources we could provide for her to help her obtain her medication. Please advise at the earliest convenience. Thank you, [name], ASA

## 2015-10-13 ENCOUNTER — Ambulatory Visit: Payer: Self-pay

## 2015-10-20 ENCOUNTER — Ambulatory Visit: Payer: Self-pay

## 2015-10-28 ENCOUNTER — Ambulatory Visit: Payer: Self-pay

## 2015-12-13 ENCOUNTER — Ambulatory Visit: Payer: Self-pay | Admitting: Family Medicine

## 2015-12-20 ENCOUNTER — Other Ambulatory Visit: Payer: Self-pay | Admitting: *Deleted

## 2015-12-20 ENCOUNTER — Telehealth: Payer: Self-pay | Admitting: Family Medicine

## 2015-12-20 DIAGNOSIS — F411 Generalized anxiety disorder: Secondary | ICD-10-CM

## 2015-12-20 DIAGNOSIS — J449 Chronic obstructive pulmonary disease, unspecified: Secondary | ICD-10-CM

## 2015-12-20 MED ORDER — ALPRAZOLAM 0.5 MG PO TABS: ORAL_TABLET | ORAL | Status: DC

## 2015-12-20 MED ORDER — ALBUTEROL SULFATE HFA 108 (90 BASE) MCG/ACT IN AERS: INHALATION_SPRAY | RESPIRATORY_TRACT | Status: DC

## 2015-12-20 NOTE — Telephone Encounter (Signed)
Refilled Albuterol to [facility] via Fax option in [place]. Printed Alprazolam refill take 1/2 tab PO TID PRN anxiety, #45, +2 refills printed, signed, and placed in to be faxed box at [facility] to [facility] at fax [phone].  Nobie Putnam, [place], PGY-3

## 2015-12-20 NOTE — Telephone Encounter (Signed)
Need a rx faxed to [facility] Dept for her albuterol.  They don't have that one on file yet.  Also need to have her alprazolam called in to same pharmacy.

## 2015-12-21 ENCOUNTER — Telehealth: Payer: Self-pay | Admitting: Family Medicine

## 2015-12-21 ENCOUNTER — Other Ambulatory Visit: Payer: Self-pay | Admitting: Family Medicine

## 2015-12-21 NOTE — Telephone Encounter (Signed)
Left message on patient voicemail informing of below.

## 2015-12-21 NOTE — Telephone Encounter (Signed)
Pt called and would like the doctor to put a release on her Xanax so that she can pick this up. jw

## 2015-12-21 NOTE — Telephone Encounter (Signed)
Patient requests we call rx into outpatient pharmacy. Rx called in, printed rx shredded.

## 2015-12-21 NOTE — Telephone Encounter (Signed)
Refilled Alprazolam rx today 12/21/15, printed rx and initially faxed to [facility] Dept, however they returned fax cover sheet today stating that they do not fill controlled substances and the copy of rx was shredded and discarded. I still have original printed rx here at [facility] in our fax records.  The printed refill Alprazolam rx is printed, signed, and available for pick-up at front office [facility]. Could you please call patient to notify her that her rx is available for pick-up anytime after today 12/21/15? She will need to bring it to any retail pharmacy, NOT [facility].  Nobie Putnam, [place], PGY-3

## 2015-12-22 ENCOUNTER — Ambulatory Visit (INDEPENDENT_AMBULATORY_CARE_PROVIDER_SITE_OTHER): Payer: Self-pay | Admitting: Family Medicine

## 2015-12-22 VITALS — BP 106/77 | HR 80 | Temp 98.2°F | Wt 167.0 lb

## 2015-12-22 DIAGNOSIS — J449 Chronic obstructive pulmonary disease, unspecified: Secondary | ICD-10-CM

## 2015-12-22 MED ORDER — MOMETASONE FURO-FORMOTEROL FUM 100-5 MCG/ACT IN AERO: 2.0000 | INHALATION_SPRAY | Freq: Two times a day (BID) | RESPIRATORY_TRACT | Status: DC

## 2015-12-22 MED ORDER — MOMETASONE FURO-FORMOTEROL FUM 200-5 MCG/ACT IN AERO: 2.0000 | INHALATION_SPRAY | Freq: Two times a day (BID) | RESPIRATORY_TRACT | Status: DC

## 2015-12-22 NOTE — Addendum Note (Signed)
Addended by: Vivi Barrack on: 12/22/2015 05:30 PM   Modules accepted: Orders

## 2015-12-22 NOTE — Assessment & Plan Note (Addendum)
Patient unable to afford albuterol inhaler currently. Given financial constraints and overall worsening of symptoms, will start dulera. Gave patient sample in office today. Instructed patient that she should use this medication daily and that the albuterol should only be used as needed. Return precautions reviewed.   Patient is in the process of applying for the MAP.

## 2015-12-22 NOTE — Patient Instructions (Signed)
We will give you a sample of dulera. Please use twice a day.  Please come back soon to meet Dr Parks Ranger.  Take care,  Dr Jerline Pain

## 2015-12-22 NOTE — Progress Notes (Signed)
    Subjective:  Kristina Moreno is a 50 y.o. female who presents to the [facility] today with a chief complaint of wheezing.   HPI:  Wheezing Patient with a history of COPD and asthma. Over the last few months has noticed a worsening of her symptoms. Has been using her albuterol 3-4 times per day. Wheezing and shortness of breath is worse at night. Has been especially worse over the past few weeks due to the pollen. Has noticed increased cough. Patient only has 2 puffs on her albuterol inhaler and does not have enough money to afford refills.   ROS: Per HPI   Objective:  Physical Exam: BP 106/77 mmHg  Pulse 80  Temp(Src) 98.2 F (36.8 C) (Oral)  Wt 167 lb (75.751 kg)  SpO2 99%  Gen: NAD, resting comfortably speaking in full sentences.  CV: RRR with no murmurs appreciated Pulm: NWOB, Diffuse end expiratory wheezes, otherwise clear MSK: no edema, cyanosis, or clubbing noted Skin: warm, dry Neuro: grossly normal, moves all extremities  Assessment/Plan:  COPD (chronic obstructive pulmonary disease) Patient unable to afford albuterol inhaler currently. Given financial constraints and overall worsening of symptoms, will start dulera. Gave patient sample in office today. Instructed patient that she should use this medication daily and that the albuterol should only be used as needed. Return precautions reviewed.   Patient is in the process of applying for the MAP.   Algis Greenhouse. Jerline Pain, [place] [facility] Resident PGY-2 12/22/2015 5:20 PM

## 2015-12-30 ENCOUNTER — Ambulatory Visit (INDEPENDENT_AMBULATORY_CARE_PROVIDER_SITE_OTHER): Payer: Self-pay | Admitting: Family Medicine

## 2015-12-30 ENCOUNTER — Encounter: Payer: Self-pay | Admitting: Family Medicine

## 2015-12-30 VITALS — BP 114/91 | HR 74 | Temp 98.0°F | Ht 65.0 in | Wt 168.8 lb

## 2015-12-30 DIAGNOSIS — M5442 Lumbago with sciatica, left side: Secondary | ICD-10-CM

## 2015-12-30 DIAGNOSIS — J449 Chronic obstructive pulmonary disease, unspecified: Secondary | ICD-10-CM

## 2015-12-30 DIAGNOSIS — Z1211 Encounter for screening for malignant neoplasm of colon: Secondary | ICD-10-CM

## 2015-12-30 DIAGNOSIS — F1210 Cannabis abuse, uncomplicated: Secondary | ICD-10-CM

## 2015-12-30 MED ORDER — NAPROXEN 500 MG PO TABS
500.0000 mg | ORAL_TABLET | Freq: Two times a day (BID) | ORAL | Status: DC
Start: 2015-12-30 — End: 2016-02-06

## 2015-12-30 MED ORDER — MOMETASONE FURO-FORMOTEROL FUM 200-5 MCG/ACT IN AERO: 2.0000 | INHALATION_SPRAY | Freq: Two times a day (BID) | RESPIRATORY_TRACT | Status: DC

## 2015-12-30 MED ORDER — CYCLOBENZAPRINE HCL 10 MG PO TABS: 5.0000 mg | ORAL_TABLET | Freq: Three times a day (TID) | ORAL | Status: DC | PRN

## 2015-12-30 MED ORDER — ALBUTEROL SULFATE HFA 108 (90 BASE) MCG/ACT IN AERS: INHALATION_SPRAY | RESPIRATORY_TRACT | Status: DC

## 2015-12-30 NOTE — Progress Notes (Signed)
Subjective:    Patient ID: Kristina Moreno, female    DOB: 1965-12-04, 50 y.o.   MRN: [id number]  Patient presents for follow-up.  HPI  COPD: - Last seen at [facility] 12/22/15 for same complaint, given [facility] sample, as she is unable to afford Albuterol. Known chronic history of COPD / asthma, with prior history of tobacco abuse. - Today reports she is awaiting MAP to start at [facility] in 01/2016, and only has Dulera sample. Requesting rx to give to MAP to continue [facility] as she is much improved. Describes triggers with outdoor allergens, seems similar to asthma. - Unable to afford Albuterol refills either - Denies any active CP, SOB, productive cough, wheezing  MARIJUANA ABUSE: - Long history of tobacco abuse, quit smoking cigarettes 9 yr ago, now admits to smoking marijuana once daily and has cut down  LEFT LOW BACK PAIN: - No known chronic history of LBP. Denies any inciting injury, trauma, fall - She is concerned about her family history of rheumatoid arthritis in both her mother and sister - Describes symptoms with acute left lower back pain about 1 week ago, gradual improvement over few days, then with some left lower leg pain in back of leg radiating to her knee but not lower, felt like a "pinched nerve" and back pain seemed to resolve. - Tried topical muscle rub with significant relief, allowing her to work with prolonged standing - Tried Ibuprofen 800mg  (OTC) 1-2 x doses, Tylenol 1000mg  twice daily for few days without relief - Denies any saddle anesthesia, bowel or bladder incontinence / retention  HM: - No longer indicated for future pap smears. S/p Total Abdominal Hysterectomy (03/2006) - Due for Colonoscopy, seems unable to get arranged prior to losing Medicaid insurance  Social History  Substance Use Topics  . Smoking status: Former Research scientist (life sciences)  . Smokeless tobacco: Never Used  . Alcohol Use: 0.0 oz/week    0 Standard drinks or equivalent per week    Review of Systems  See above  HPI    Objective:   Physical Exam  Constitutional: She appears well-developed and well-nourished. No distress.  Well-appearing, comfortable, cooperative  HENT:  Head: Normocephalic and atraumatic.  Mouth/Throat: Oropharynx is clear and moist. No oropharyngeal exudate.  Cardiovascular: Normal rate, regular rhythm, normal heart sounds and intact distal pulses.   No murmur heard. Pulmonary/Chest: Effort normal and breath sounds normal. No respiratory distress. She has no wheezes. She has no rales.  Good air movement. Speaks full sentences.  Musculoskeletal: She exhibits no edema.  Low Back Inspection: Normal appearance. No deformity. Palpation: Mild Left lower lumbar paraspinal hypertonicity without significant tenderness to touch. Non-tender over spinous processes ROM: Full AROM forward flex / extension, rotation. Special Testing: Seated SLR, tight left leg pain but no radicular symptoms Strength: 5/5 bilateral hip flex, knee flex/ext, ankle dorsiflex Neurovascular: Distally intact   Neurological: She is alert.  Normal gait. Distal sensation intact to light touch  Skin: Skin is warm and dry. No rash noted. She is not diaphoretic.  Psychiatric: She has a normal mood and affect. Her behavior is normal.  Nursing note and vitals reviewed.   BP 114/91 mmHg  Pulse 74  Temp(Src) 98 F (36.7 C) (Oral)  Ht 5\' 5"  (1.651 m)  Wt 168 lb 12.8 oz (76.567 kg)  BMI 28.09 kg/m2    Assessment & Plan:   Problem List Items Addressed This Visit    Cannabis abuse    Regular use, not interested in quitting. Suspect contributing  to copd/asthma. - Counseled on quitting      COPD (chronic obstructive pulmonary disease) ([place]) - Primary    Not consistent with COPD flare, but respiratory symptoms with prior wheezing significantly improved on Dulera sample. Unable to afford Albuterol. - May have had PFTs in 2008, do not see records  Plan: 1. Printed rx for Albuterol PRN and Dulera 2 puffs BID, +11  refills - bring to MAP in 01/2016 2. Return precautions given if worsening resp status      Relevant Medications   mometasone-formoterol (DULERA) 200-5 MCG/ACT AERO   albuterol (PROAIR HFA) 108 (90 Base) MCG/ACT inhaler   Left-sided low back pain with sciatica    Resolving, but recent acute new onset L LBP with associated mild L sciatica. Suspect likely due to muscle spasm/strain, without known injury or trauma (prolonged standing at work, likely contributing) - No red flag symptoms. No known DJD. Negative SLR for radiculopathy - Limited response to conservative therapy, topicals helping most for leg pain  Plan: 1. Start anti-inflammatory trial with rx Naprosyn 500mg  BID wc x 2-4 weeks, then PRN 2. Start muscle relaxant with Flexeril 10mg  tabs - take 5-10mg  up to TID PRN, titrate up as tolerated 3. May use Tylenol PRN for breakthrough 4. Encouraged use of heating pad, muscle rub PRN 5.  RTC 1 month, re-evaluation. No indications for X-rays or PT. Caution prednisone if concern sciatica given bipolar disorder, would favor gabapentin if needed      Relevant Medications   naproxen (NAPROSYN) 500 MG tablet   cyclobenzaprine (FLEXERIL) 10 MG tablet   Screening for colon cancer    Never obtained colonoscopy, seems lost medicaid and then maybe didn't meet criteria for [facility] financial assistance for [facility] colonoscopy - Advised to follow-up with [facility] [employer] Counselor to discuss options        Return in about 6 weeks (around 02/10/2016) for Low Back Pain.  Kristina Moreno, [place], PGY-3

## 2015-12-30 NOTE — Patient Instructions (Signed)
Thank you for coming in to clinic today.   1. For your COPD inhalers - Printed refill for both Dulera and Albuterol bring to [facility] for MAP program - if worsening breathing, return sooner for re-evaluation  2. For Back pain 1. For your Back Pain - I think that this is due to Muscle Spasms or strain. Your Sciatic Nerve can be affected causing some of your radiation and numbness down your legs. 2. Start with anti-inflammatory Naprosyn (Naproxen) 500mg  twice daily (12 hrs apart, with food, breakfast and dinner) every day for next 2 to 4 weeks if helping, then can use only as needed 3. Start Cyclobenzapine (Flexeril) 10mg  tablets - cut in half for 5mg  at night for muscle relaxant - may make you sedated or sleepy (be careful driving or working on this) if tolerated you can take every 8 hours, half or whole tab 4. May use Tylenol Extra Str 500mg  tabs - may take 1-2 tablets every 6 hours as needed 5. Recommend to start using heating pad on your lower back 1-2x daily for few weeks  This pain may take weeks to months to fully resolve, but hopefully it will respond to the medicine initially. All back injuries (small or serious) are slow to heal since we use our back muscles every day. Be careful with turning, twisting, lifting, sitting / standing for prolonged periods, and avoid re-injury.  Please contact our [place] to discuss coverage for Colonoscopy  Please schedule a follow-up appointment with Dr Parks Ranger in 4 to 6 weeks for Low Back Pain follow-up  If you have any other questions or concerns, please feel free to call the clinic to contact me. You may also schedule an earlier appointment if necessary.  However, if your symptoms get significantly worse, please go to the Emergency Department to seek immediate medical attention.  Kristina Moreno, [place]

## 2015-12-31 ENCOUNTER — Encounter: Payer: Self-pay | Admitting: Family Medicine

## 2015-12-31 NOTE — Assessment & Plan Note (Addendum)
Resolving, but recent acute new onset L LBP with associated mild L sciatica. Suspect likely due to muscle spasm/strain, without known injury or trauma (prolonged standing at work, likely contributing) - No red flag symptoms. No known DJD. Negative SLR for radiculopathy - Limited response to conservative therapy, topicals helping most for leg pain  Plan: 1. Start anti-inflammatory trial with rx Naprosyn 500mg  BID wc x 2-4 weeks, then PRN 2. Start muscle relaxant with Flexeril 10mg  tabs - take 5-10mg  up to TID PRN, titrate up as tolerated 3. May use Tylenol PRN for breakthrough 4. Encouraged use of heating pad, muscle rub PRN 5.  RTC 1 month, re-evaluation. No indications for X-rays or PT. Caution prednisone if concern sciatica given bipolar disorder, would favor gabapentin if needed

## 2015-12-31 NOTE — Assessment & Plan Note (Signed)
Never obtained colonoscopy, seems lost medicaid and then maybe didn't meet criteria for [facility] financial assistance for [facility] colonoscopy - Advised to follow-up with [facility] [employer] Counselor to discuss options

## 2015-12-31 NOTE — Assessment & Plan Note (Signed)
Not consistent with COPD flare, but respiratory symptoms with prior wheezing significantly improved on Dulera sample. Unable to afford Albuterol. - May have had PFTs in 2008, do not see records  Plan: 1. Printed rx for Albuterol PRN and Dulera 2 puffs BID, +11 refills - bring to MAP in 01/2016 2. Return precautions given if worsening resp status

## 2015-12-31 NOTE — Assessment & Plan Note (Signed)
Regular use, not interested in quitting. Suspect contributing to copd/asthma. - Counseled on quitting

## 2016-01-02 ENCOUNTER — Encounter: Payer: Self-pay | Admitting: Family Medicine

## 2016-01-26 ENCOUNTER — Ambulatory Visit: Payer: Self-pay | Admitting: Family Medicine

## 2016-02-06 ENCOUNTER — Ambulatory Visit (INDEPENDENT_AMBULATORY_CARE_PROVIDER_SITE_OTHER): Payer: Self-pay | Admitting: Family Medicine

## 2016-02-06 ENCOUNTER — Encounter: Payer: Self-pay | Admitting: Family Medicine

## 2016-02-06 VITALS — BP 111/69 | HR 62 | Temp 98.3°F | Ht 65.0 in | Wt 171.0 lb

## 2016-02-06 DIAGNOSIS — E785 Hyperlipidemia, unspecified: Secondary | ICD-10-CM | POA: Insufficient documentation

## 2016-02-06 DIAGNOSIS — M5442 Lumbago with sciatica, left side: Secondary | ICD-10-CM

## 2016-02-06 DIAGNOSIS — E663 Overweight: Secondary | ICD-10-CM

## 2016-02-06 MED ORDER — NAPROXEN 500 MG PO TABS: 500.0000 mg | ORAL_TABLET | Freq: Two times a day (BID) | ORAL | Status: DC

## 2016-02-06 NOTE — Assessment & Plan Note (Signed)
Continues to resolve gradually >1 month. Still Left LBP with intermittent Left sciatica, infrequently. No new injury. Likely provoked from prolonged standing at work and mattress.  Plan: 1. Refilled Naproxen 500mg  BID x 2 week then PRN - incase recurrent flare / pain 2. Discontinue Flexeril - no relief 3. Considered Gabapentin, patient not interested and do not think indicated today, may need in future if chronic sciatica 4. May continue other conservative measures as discussed 5. Printed hand outs for Low Back stretching exercises, start walking again 6. No X-rays today 7. RTC 3 mo if still back pain, sooner / return criteria

## 2016-02-06 NOTE — Progress Notes (Signed)
Subjective:    Patient ID: Kristina Moreno, female    DOB: [birth year], 50 y.o.   MRN: [id number]  Patient presents for follow-up LOW BACK PAIN  HPI  LEFT LOW BACK PAIN, intermittent Left Sciatica: - Last seen 12/30/15 for physical and evaluated Left LBP. See prior notes for additional details, briefly no known Low Back injury or trauma, symptoms consistent with chronic history of arthritis with acute on chronic flare with Left LBP and some intermittent Left sciatica pain radiating down leg. Given Naproxen and Flexeril. - Today returns with significant improvement in LBP after 1 month, has many "good days" without pain, still admits intermittent flares about 2x weekly with Left LBP lasting half a day or less, no new injury, pain worse with prolonged standing at work and also laying on current mattress seems to wake up with some pain. Infrequent left leg radiating pain. - Has not resumed walking exercise yet - Finished Naproxen 500mg  BID x 2 weeks, then as needed with good relief, tried Flexeril up to 10mg  1-2x without significant relief - Tried Ibuprofen 800mg  (OTC) 1-2 x doses, Tylenol 1000mg  twice daily for few days without relief - Denies any saddle anesthesia, bowel or bladder incontinence / retention  HLD / Overweight BMI 28 - Prior history of abnormal lipid panel in 2011 with elevated LDL - Continued slight amount of weight gain, without significant wt loss, +3 lbs in 1 month   Social History  Substance Use Topics  . Smoking status: Former Research scientist (life sciences)  . Smokeless tobacco: Never Used  . Alcohol Use: 0.0 oz/week    0 Standard drinks or equivalent per week    Review of Systems  See above HPI    Objective:   Physical Exam  Constitutional: She appears well-developed and well-nourished. No distress.  Well-appearing, comfortable, cooperative  Cardiovascular: Normal rate and intact distal pulses.   Pulmonary/Chest: Effort normal.  Musculoskeletal: She exhibits no edema.  Low  Back Inspection: Normal appearance. No deformity. Palpation: Resolved left lower lumbar paraspinal hypertonicity. Non-tender paraspinal muscles, no tenderness over spinous processes ROM: Full AROM forward flex / extension, rotation. Special Testing: Seated SLR bilateral resolved low back pain, no radicular pain. Strength: 5/5 bilateral hip flex, knee flex/ext, ankle dorsiflex Neurovascular: Distally intact   Neurological: She is alert.  Skin: Skin is warm and dry. No rash noted. She is not diaphoretic.  Nursing note and vitals reviewed.   BP 111/69 mmHg  Pulse 62  Temp(Src) 98.3 F (36.8 C) (Oral)  Ht 5\' 5"  (1.651 m)  Wt 171 lb (77.565 kg)  BMI 28.46 kg/m2    Assessment & Plan:   Problem List Items Addressed This Visit    Left-sided low back pain with sciatica - Primary    Continues to resolve gradually >1 month. Still Left LBP with intermittent Left sciatica, infrequently. No new injury. Likely provoked from prolonged standing at work and mattress.  Plan: 1. Refilled Naproxen 500mg  BID x 2 week then PRN - incase recurrent flare / pain 2. Discontinue Flexeril - no relief 3. Considered Gabapentin, patient not interested and do not think indicated today, may need in future if chronic sciatica 4. May continue other conservative measures as discussed 5. Printed hand outs for Low Back stretching exercises, start walking again 6. No X-rays today 7. RTC 3 mo if still back pain, sooner / return criteria      Relevant Medications   naproxen (NAPROSYN) 500 MG tablet   Hyperlipidemia    Gradual weight gain,  prior elevated lipids with LDL >120 Check direct LDL (non -fasting today) Also check CMET given abnormal lipids      Relevant Orders   COMPLETE METABOLIC PANEL WITH GFR   LDL cholesterol, direct    Other Visit Diagnoses    Overweight (BMI 25.0-29.9)        Relevant Orders    COMPLETE METABOLIC PANEL WITH GFR      Return in about 3 months (around 05/07/2016) for low back  pain, follow-up.  Nobie Putnam, [place], PGY-3

## 2016-02-06 NOTE — Assessment & Plan Note (Addendum)
Gradual weight gain, prior elevated lipids with LDL >120 Check direct LDL (non -fasting today) Also check CMET given abnormal lipids

## 2016-02-06 NOTE — Patient Instructions (Addendum)
Thank you for coming in to clinic today.   1. For your COPD inhalers - Printed refill for [facility] bring to [facility] for MAP program - (see if you can get Albuterol as well)  2. For Back pain 1. For your Back Pain -  - Refilled (if you get another flare again re-start anti-inflammatory Naprosyn (Naproxen) 500mg  twice daily (12 hrs apart, with food, breakfast and dinner) every day for next 2 to 4 weeks if helping, then can use only as needed 2. May use Tylenol Extra Str 500mg  tabs - may take 1-2 tablets every 6 hours as needed 3. Recommend to start using heating pad on your lower back 1-2x daily for few weeks 4. May start regular exercising with walking  This pain may take weeks to months to fully resolve, but hopefully it will respond to the medicine initially. All back injuries (small or serious) are slow to heal since we use our back muscles every day. Be careful with turning, twisting, lifting, sitting / standing for prolonged periods, and avoid re-injury.  Please contact our [place] to discuss coverage for Colonoscopy   Please schedule a follow-up appointment with Dr Parks Ranger in 3 months as needed for Low Back Pain  If you have any other questions or concerns, please feel free to call the clinic to contact me. You may also schedule an earlier appointment if necessary.  However, if your symptoms get significantly worse, please go to the Emergency Department to seek immediate medical attention.  Nobie Putnam, [place]

## 2016-02-09 ENCOUNTER — Other Ambulatory Visit: Payer: Self-pay | Admitting: Family Medicine

## 2016-02-09 DIAGNOSIS — H8109 Meniere's disease, unspecified ear: Secondary | ICD-10-CM

## 2016-03-12 ENCOUNTER — Encounter: Payer: Self-pay | Admitting: Family Medicine

## 2016-03-12 ENCOUNTER — Ambulatory Visit (INDEPENDENT_AMBULATORY_CARE_PROVIDER_SITE_OTHER): Payer: Self-pay | Admitting: Family Medicine

## 2016-03-12 VITALS — BP 118/79 | HR 85 | Temp 98.0°F | Ht 65.0 in | Wt 166.3 lb

## 2016-03-12 DIAGNOSIS — L600 Ingrowing nail: Secondary | ICD-10-CM

## 2016-03-12 DIAGNOSIS — L299 Pruritus, unspecified: Secondary | ICD-10-CM

## 2016-03-12 MED ORDER — TRIAMCINOLONE ACETONIDE 0.1 % EX CREA: TOPICAL_CREAM | CUTANEOUS | Status: DC

## 2016-03-12 NOTE — Progress Notes (Signed)
   Subjective:    Patient ID: Kristina Moreno, female    DOB: 10-03-1966, 50 y.o.   MRN: [id number]  Kristina Moreno is a 50 y.o. female presenting on 03/12/2016 for feet color change   Patient presents for a same day appointment.   HPI  Incurved Great Toenails with concern for Ingrown Toenail - Reports she has been getting Pedicure every 2 weeks for past 6 weeks, says they cut out small piece of her toenail on left great toe, nail now seems to be curving inward. Also complains of itching of both big toenails, with some slight "darkening" of skin at nailbed. Has not tried any medications or put any topicals on. - Requesting podiatry referral - Denies any fevers/chills, rash, swelling or redness  Social History  Substance Use Topics  . Smoking status: Former Research scientist (life sciences)  . Smokeless tobacco: Never Used  . Alcohol Use: 0.0 oz/week    0 Standard drinks or equivalent per week    Review of Systems Per HPI unless specifically indicated above     Objective:    BP 118/79 mmHg  Pulse 85  Temp(Src) 98 F (36.7 C) (Oral)  Ht 5\' 5"  (1.651 m)  Wt 166 lb 4.8 oz (75.433 kg)  BMI 27.67 kg/m2  Wt Readings from Last 3 Encounters:  03/12/16 166 lb 4.8 oz (75.433 kg)  02/06/16 171 lb (77.565 kg)  12/30/15 168 lb 12.8 oz (76.567 kg)    Physical Exam  Constitutional: She appears well-developed and well-nourished. No distress.  Well-appearing, comfortable, cooperative  Skin: Skin is warm and dry. No rash noted. She is not diaphoretic.  Bilateral great toenails with thicker appearance and Left > Right with incurving of lateral edge of toenail, does not appear ingrown but is curling in, and without erythema, edema, drainage, ulceration. Non-tender to touch. Some small darker discoloration at nailbed base of bilateral great toes, location of itching. No rash. Other toes and nails appear normal.  Nursing note and vitals reviewed.  Results for orders placed or performed in visit on 01/05/15  Hepatitis  C antibody  Result Value Ref Range   HCV Ab NEGATIVE NEGATIVE      Assessment & Plan:   Problem List Items Addressed This Visit    Incurved toenail - Primary    Left > Right great toenail with some lateral incurving from recent pedicures. Not consistent with completely ingrown toenail. No sign of infection, redness, pain or swelling. - Referral to Podiatry for evaluation, may need partial or complete toenail removal and treatment as indicated, given lack of infection deferred at this time      Relevant Orders   Ambulatory referral to Podiatry    Other Visit Diagnoses    Itching        bilateral great toes likely itching after recent pedicures maybe with some healing tissue. Request refill Kenalog, may use topical for any itching, may not help    Relevant Medications    triamcinolone cream (KENALOG) 0.1 %       Meds ordered this encounter  Medications  . triamcinolone cream (KENALOG) 0.1 %    Sig: Apply a thin layer to affected area twice daily. Up to 2 weeks.    Dispense:  80 g    Refill:  1      Follow up plan: Return in about 3 months (around 06/12/2016).  Nobie Putnam, [place], PGY-3

## 2016-03-12 NOTE — Assessment & Plan Note (Signed)
Left > Right great toenail with some lateral incurving from recent pedicures. Not consistent with completely ingrown toenail. No sign of infection, redness, pain or swelling. - Referral to Podiatry for evaluation, may need partial or complete toenail removal and treatment as indicated, given lack of infection deferred at this time

## 2016-03-12 NOTE — Patient Instructions (Signed)
Thank you for coming in to clinic today.  1. Referral sent to [facility], you should be notified within 1-2 weeks about your upcoming appointment. This is next best step as they can treat your toenails, may need more advanced procedure than we can offer here.  2. Use Triamcinolone cream on your toes and feet for itching twice a day for up to 2 weeks - May take Cetirizine or Zyrtec once a day as well for itching - As discussed the discoloration may be some bruising and healing tissue will itch, this may not get better until it fully heals  Please schedule a follow-up appointment with Dr Parks Ranger as needed within 1-3 months for chronic conditions  If you have any other questions or concerns, please feel free to call the clinic to contact me. You may also schedule an earlier appointment if necessary.  However, if your symptoms get significantly worse, please go to the Emergency Department to seek immediate medical attention.  Nobie Putnam, [place]

## 2016-03-20 ENCOUNTER — Other Ambulatory Visit: Payer: Self-pay | Admitting: Family Medicine

## 2016-03-20 DIAGNOSIS — F411 Generalized anxiety disorder: Secondary | ICD-10-CM

## 2016-04-02 ENCOUNTER — Ambulatory Visit (INDEPENDENT_AMBULATORY_CARE_PROVIDER_SITE_OTHER): Payer: No Typology Code available for payment source | Admitting: Podiatry

## 2016-04-02 ENCOUNTER — Telehealth: Payer: Self-pay | Admitting: *Deleted

## 2016-04-02 ENCOUNTER — Encounter: Payer: Self-pay | Admitting: Podiatry

## 2016-04-02 DIAGNOSIS — Z1211 Encounter for screening for malignant neoplasm of colon: Secondary | ICD-10-CM

## 2016-04-02 DIAGNOSIS — B351 Tinea unguium: Secondary | ICD-10-CM

## 2016-04-02 DIAGNOSIS — L600 Ingrowing nail: Secondary | ICD-10-CM

## 2016-04-02 NOTE — Telephone Encounter (Addendum)
Dr. Jacqualyn Posey ordered fungal culture from [facility]. 04/20/2016-Informed pt that fungal culture results took 4-6 weeks before results returned.

## 2016-04-02 NOTE — Progress Notes (Signed)
   Subjective:    Patient ID: Kristina Moreno, female    DOB: Feb 25, 1966, 50 y.o.   MRN: [id number]  HPI  50 year old female presents the office today for concerns of both of her big toenails becoming ingrown. She had a pedicure about 6 weeks ago and afterwards she states they cut the nail to short period she developed an itchy rash around the toenail which she was taken steroid cream for which alleviated the symptoms however she is concerned of the toenail itself is still painful and discolored. Denies any drainage or pus and no surrounding redness. No red streaks. She has had no treatment to the toenail otherwise. No other complaints.   Review of Systems  All other systems reviewed and are negative.      Objective:   Physical Exam General: AAO x3, NAD; very upset/irate with the nail salon today.  Dermatological: Bilateral hallux nails and the right second digit toenail is hypertrophic, dystrophic, discolored and there is incurvation of both the medial and lateral nail borders and the right second digit toenail somewhat loose and the underlying nail bed and only here on the proximal nail border. There is no drainage or pus. There is no surrounding edema, erythema, increase in warmth. No before meals cellulitis. No drainage or pus or any malodor.  Vascular: Dorsalis Pedis artery and Posterior Tibial artery pedal pulses are 2/4 bilateral with immedate capillary fill time. Pedal hair growth present. There is no pain with calf compression, swelling, warmth, erythema.   Neruologic: Grossly intact via light touch bilateral. Vibratory intact via tuning fork bilateral. Protective threshold with Semmes Wienstein monofilament intact to all pedal sites bilateral.   Musculoskeletal: No gross boney pedal deformities bilateral. No pain, crepitus, or limitation noted with foot and ankle range of motion bilateral. Muscular strength 5/5 in all groups tested bilateral.  Gait: Unassisted, Nonantalgic.        Assessment & Plan:  50 year old female with ingrown toenails without signs of infection. -Treatment options discussed including all alternatives, risks, and complications -Etiology of symptoms were discussed -Discussed possible nail avulsion. She would like to proceed with nail debridement today. The nails were debrided bilateral hallux and right second digit toenail without any complications or bleeding. The nails were also sent for culture to evaluate for possible fungus. -Monitor for signs or symptoms of infection -Follow-up after nail culture sooner if needed.  Celesta Gentile, DPM

## 2016-04-02 NOTE — Telephone Encounter (Signed)
Patient called and would like a referral to GI for her colonoscopy.  She is need of her first screening colonoscopy and has a family history of cancer on her father's side.  Will forward to MD to place referral. Johnney Ou

## 2016-04-02 NOTE — Telephone Encounter (Signed)
Referral for screening colonoscopy has been placed. 

## 2016-04-04 ENCOUNTER — Encounter: Payer: Self-pay | Admitting: Gastroenterology

## 2016-04-09 ENCOUNTER — Ambulatory Visit: Payer: Self-pay | Admitting: Family Medicine

## 2016-04-23 ENCOUNTER — Ambulatory Visit (AMBULATORY_SURGERY_CENTER): Payer: Self-pay | Admitting: *Deleted

## 2016-04-23 ENCOUNTER — Encounter: Payer: Self-pay | Admitting: Family Medicine

## 2016-04-23 ENCOUNTER — Ambulatory Visit (INDEPENDENT_AMBULATORY_CARE_PROVIDER_SITE_OTHER): Payer: Self-pay | Admitting: Family Medicine

## 2016-04-23 VITALS — Ht 65.0 in | Wt 165.0 lb

## 2016-04-23 VITALS — BP 106/73 | HR 79 | Temp 98.5°F | Ht 65.0 in | Wt 165.6 lb

## 2016-04-23 DIAGNOSIS — F3162 Bipolar disorder, current episode mixed, moderate: Secondary | ICD-10-CM | POA: Insufficient documentation

## 2016-04-23 DIAGNOSIS — Z1211 Encounter for screening for malignant neoplasm of colon: Secondary | ICD-10-CM

## 2016-04-23 DIAGNOSIS — L600 Ingrowing nail: Secondary | ICD-10-CM

## 2016-04-23 DIAGNOSIS — F319 Bipolar disorder, unspecified: Secondary | ICD-10-CM

## 2016-04-23 DIAGNOSIS — F1210 Cannabis abuse, uncomplicated: Secondary | ICD-10-CM

## 2016-04-23 MED ORDER — NA SULFATE-K SULFATE-MG SULF 17.5-3.13-1.6 GM/177ML PO SOLN: 1.0000 | Freq: Once | ORAL | Status: DC

## 2016-04-23 NOTE — Patient Instructions (Addendum)
It was a pleasure to meet you this morning Kristina Moreno. I am glad your toe nails are doing better! I have sent a message to our coordinator to schedule you a visit with a psychiatrist in the area. She is currently out of town but will get back to me and I will get in touch with you to determine a plan.   If you have any other questions or concerns, please feel free to call the clinic to contact me. You may also schedule an earlier appointment if necessary.  However, if you begin to have suicidal thoughts, please go to the Emergency Department to seek immediate medical attention.  -- Dr. Yisroel Ramming DO  Bipolar Disorder Bipolar disorder is a mental illness. The term bipolar disorder actually is used to describe a group of disorders that all share varying degrees of emotional highs and lows that can interfere with daily functioning, such as work, school, or relationships. Bipolar disorder also can lead to drug abuse, hospitalization, and suicide. The emotional highs of bipolar disorder are periods of elation or irritability and high energy. These highs can range from a mild form (hypomania) to a severe form (mania). People experiencing episodes of hypomania may appear energetic, excitable, and highly productive. People experiencing mania may behave impulsively or erratically. They often make poor decisions. They may have difficulty sleeping. The most severe episodes of mania can involve having very distorted beliefs or perceptions about the world and seeing or hearing things that are not real (psychotic delusions and hallucinations).  The emotional lows of bipolar disorder (depression) also can range from mild to severe. Severe episodes of bipolar depression can involve psychotic delusions and hallucinations. Sometimes people with bipolar disorder experience a state of mixed mood. Symptoms of hypomania or mania and depression are both present during this mixed-mood episode. SIGNS AND SYMPTOMS There are signs  and symptoms of the episodes of hypomania and mania as well as the episodes of depression. The signs and symptoms of hypomania and mania are similar but vary in severity. They include:  Inflated self-esteem or feeling of increased self-confidence.  Decreased need for sleep.  Unusual talkativeness (rapid or pressured speech) or the feeling of a need to keep talking.  Sensation of racing thoughts or constant talking, with quick shifts between topics that may or may not be related (flight of ideas).  Decreased ability to focus or concentrate.  Increased purposeful activity, such as work, studies, or social activity, or nonproductive activity, such as pacing, squirming and fidgeting, or finger and toe tapping.  Impulsive behavior and use of poor judgment, resulting in high-risk activities, such as having unprotected sex or spending excessive amounts of money. Signs and symptoms of depression include the following:   Feelings of sadness, hopelessness, or helplessness.  Frequent or uncontrollable episodes of crying.  Lack of feeling anything or caring about anything.  Difficulty sleeping or sleeping too much.  Inability to enjoy the things you used to enjoy.   Desire to be alone all the time.   Feelings of guilt or worthlessness.  Lack of energy or motivation.   Difficulty concentrating, remembering, or making decisions.  Change in appetite or weight beyond normal fluctuations.  Thoughts of death or the desire to harm yourself. DIAGNOSIS  Bipolar disorder is diagnosed through an assessment by your caregiver. Your caregiver will ask questions about your emotional episodes. There are two main types of bipolar disorder. People with type I bipolar disorder have manic episodes with or without depressive episodes. People  with type II bipolar disorder have hypomanic episodes and major depressive episodes, which are more serious than mild depression. The type of bipolar disorder you  have can make an important difference in how your illness is monitored and treated. Your caregiver may ask questions about your medical history and use of alcohol or drugs, including prescription medication. Certain medical conditions and substances also can cause emotional highs and lows that resemble bipolar disorder (secondary bipolar disorder).  TREATMENT  Bipolar disorder is a long-term illness. It is best controlled with continuous treatment rather than treatment only when symptoms occur. The following treatments can be prescribed for bipolar disorders:  Medication--Medication can be prescribed by a doctor that is an expert in treating mental disorders (psychiatrists). Medications called mood stabilizers are usually prescribed to help control the illness. Other medications are sometimes added if symptoms of mania, depression, or psychotic delusions and hallucinations occur despite the use of a mood stabilizer.  Talk therapy--Some forms of talk therapy are helpful in providing support, education, and guidance. A combination of medication and talk therapy is best for managing the disorder over time. A procedure in which electricity is applied to your brain through your scalp (electroconvulsive therapy) is used in cases of severe mania when medication and talk therapy do not work or work too slowly.   This information is not intended to replace advice given to you by your health care provider. Make sure you discuss any questions you have with your health care provider.   Document Released: 01/14/2001 Document Revised: 10/29/2014 Document Reviewed: 11/03/2012 Elsevier Interactive Patient Education [employer].

## 2016-04-23 NOTE — Assessment & Plan Note (Signed)
Ongoing abuse. Currently smokes x3 per day. - Encouraged patient to stop marijuana use as this would be contributing to her depressive-like moods

## 2016-04-23 NOTE — Progress Notes (Signed)
Subjective:     Patient ID: Kristina Moreno, female   DOB: July 10, 1966, 50 y.o.   MRN: [id number]  HPI Patient is a 50 y.o. Female who presents to the office for follow up regarding bilateral big toe nail pain. During the visit the patient also complained of some depression-like symptoms and wants to see a therapist. She is not experiencing suicidal or homicidal thoughts or actions.  Big Toe Pain: Reason for visit. Asymptomatic at present and greatly improved. Status-post bilateral big toe avulsion.  Depression: New complaint at clinic. Was previously seen by therapist over 1-year ago but cannot remember who. Says she occasionally sees shadows on her peripheral vision. Does not complain of hearing voices. Recent stresses include breaking up with boyfriend of 57-months, and job loss. PHQ-9 score 9 at present.  Review of Systems  ROS see HPI  Smoking status: Former smoker     Objective:   Physical Exam  Constitutional: She is oriented to person, place, and time. She appears well-developed and well-nourished.  HENT:  Head: Normocephalic and atraumatic.  Eyes: Conjunctivae and EOM are normal. Pupils are equal, round, and reactive to light.  Neck: Normal range of motion. Neck supple.  Cardiovascular: Normal rate, regular rhythm, normal heart sounds and intact distal pulses.  Exam reveals no gallop and no friction rub.   No murmur heard. Pulmonary/Chest: Effort normal and breath sounds normal. No respiratory distress. She has no wheezes. She has no rales.  Neurological: She is alert and oriented to person, place, and time.  Skin: Skin is warm and dry.  Psychiatric: She has a normal mood and affect. Her behavior is normal. Judgment and thought content normal.       Assessment & Plan:     Moderate mixed bipolar I disorder (HCC) Previously seen by therapist which cannot be identified. Not on any medications at present. Patient was complaining of some depressive-like moods over the past few  months. Patient would like to be seen by a psychiatrist. - Not suicidal or homicidal at present - [place] to set up psychiatrist referral - Encouraged patient to stay active and meet with family members like she did in the past  Cannabis abuse Ongoing abuse. Currently smokes x3 per day. - Encouraged patient to stop marijuana use as this would be contributing to her depressive-like moods  Incurved toenail Reason for visit. Resolved. Was seen by Podiatrist x3 weeks ago. Status-post bilateral big toe avulsion. Symptoms are improving and patient is feeling much better. - Follow-up with podiatrist for bilateral big toe incurved nails

## 2016-04-23 NOTE — Assessment & Plan Note (Addendum)
Previously seen by therapist which cannot be identified. Not on any medications at present. Patient was complaining of some depressive-like moods over the past few months. Patient would like to be seen by a psychiatrist. - Not suicidal or homicidal at present - [place] to set up psychiatrist referral - Encouraged patient to stay active and meet with family members like she did in the past

## 2016-04-23 NOTE — Progress Notes (Signed)
No egg or soy allergy known to patient  No issues with past sedation with any surgeries  or procedures, no intubation problems  No diet pills per patient No home 02 use per patient  No blood thinners per patient  Pt denies issues with constipation  emmi video to e mail  Pt has orange card --leslie to call to pick up prep 1 week before procedure- staff message to [facility]

## 2016-04-23 NOTE — Assessment & Plan Note (Signed)
Reason for visit. Resolved. Was seen by Podiatrist x3 weeks ago. Status-post bilateral big toe avulsion. Symptoms are improving and patient is feeling much better. - Follow-up with podiatrist for bilateral big toe incurved nails

## 2016-05-02 LAB — CULTURE, FUNGUS WITHOUT SMEAR

## 2016-05-03 ENCOUNTER — Telehealth: Payer: Self-pay

## 2016-05-03 NOTE — Telephone Encounter (Signed)
Told patient I would put a Suprep samplel up front for her to pick up.  Patient agreed.  She also asked for a copy of her instructions that she had misplaced.  Will put both up front.

## 2016-05-07 ENCOUNTER — Ambulatory Visit (AMBULATORY_SURGERY_CENTER): Payer: Self-pay | Admitting: Gastroenterology

## 2016-05-07 ENCOUNTER — Encounter: Payer: Self-pay | Admitting: Gastroenterology

## 2016-05-07 VITALS — BP 106/65 | HR 60 | Temp 98.4°F | Resp 13 | Ht 65.0 in | Wt 165.0 lb

## 2016-05-07 DIAGNOSIS — Z1211 Encounter for screening for malignant neoplasm of colon: Secondary | ICD-10-CM

## 2016-05-07 DIAGNOSIS — D123 Benign neoplasm of transverse colon: Secondary | ICD-10-CM

## 2016-05-07 LAB — GLUCOSE, CAPILLARY: GLUCOSE-CAPILLARY: 103 mg/dL — AB (ref 65–99)

## 2016-05-07 MED ORDER — SODIUM CHLORIDE 0.9 % IV SOLN: 500.0000 mL | INTRAVENOUS | Status: DC

## 2016-05-07 NOTE — Progress Notes (Signed)
Called to room to assist during endoscopic procedure.  Patient ID and intended procedure confirmed with present staff. Received instructions for my participation in the procedure from the performing physician.  

## 2016-05-07 NOTE — Op Note (Signed)
[facility] Patient Name: Kristina Moreno Procedure Date: 05/07/2016 1:17 PM MRN: [id number] Endoscopist: Mauri Pole , MD Age: 50 Referring MD:  Date of Birth: 1966/04/12 Gender: Female Account #: [id number] Procedure:                Colonoscopy Indications:              Screening for colorectal malignant neoplasm, This                            is the patient's first colonoscopy Medicines:                Monitored Anesthesia Care Procedure:                Pre-Anesthesia Assessment:                           - Prior to the procedure, a History and Physical                            was performed, and patient medications and                            allergies were reviewed. The patient's tolerance of                            previous anesthesia was also reviewed. The risks                            and benefits of the procedure and the sedation                            options and risks were discussed with the patient.                            All questions were answered, and informed consent                            was obtained. Prior Anticoagulants: The patient has                            taken no previous anticoagulant or antiplatelet                            agents. ASA Grade Assessment: III - A patient with                            severe systemic disease. After reviewing the risks                            and benefits, the patient was deemed in                            satisfactory condition to undergo the procedure.  After obtaining informed consent, the colonoscope                            was passed under direct vision. Throughout the                            procedure, the patient's blood pressure, pulse, and                            oxygen saturations were monitored continuously. The                            Model CF-HQ190L [id number]) scope was introduced                            through the anus  and advanced to the the terminal                            ileum, with identification of the appendiceal                            orifice and IC valve. The colonoscopy was performed                            without difficulty. The patient tolerated the                            procedure well. The quality of the bowel                            preparation was excellent. The terminal ileum,                            ileocecal valve, appendiceal orifice, and rectum                            were photographed. Scope In: 1:20:40 PM Scope Out: 1:34:57 PM Scope Withdrawal Time: 0 hours 9 minutes 55 seconds  Total Procedure Duration: 0 hours 14 minutes 17 seconds  Findings:                 The perianal and digital rectal examinations were                            normal.                           A 4 mm polyp was found in the transverse colon. The                            polyp was sessile. The polyp was removed with a                            cold snare. Resection and retrieval were complete.  A few small-mouthed diverticula were found in the                            ascending colon and cecum.                           Non-bleeding internal hemorrhoids were found during                            retroflexion. The hemorrhoids were medium-sized. Complications:            No immediate complications. Estimated Blood Loss:     Estimated blood loss: none. Impression:               - One 4 mm polyp in the transverse colon, removed                            with a cold snare. Resected and retrieved.                           - Diverticulosis in the ascending colon and in the                            cecum.                           - Non-bleeding internal hemorrhoids. Recommendation:           - Patient has a contact number available for                            emergencies. The signs and symptoms of potential                            delayed  complications were discussed with the                            patient. Return to normal activities tomorrow.                            Written discharge instructions were provided to the                            patient.                           - Resume previous diet.                           - Continue present medications.                           - Await pathology results.                           - Repeat colonoscopy in 5 years for surveillance.                           -  Return to GI clinic PRN. Mauri Pole, MD 05/07/2016 1:41:46 PM This report has been signed electronically.

## 2016-05-07 NOTE — Progress Notes (Signed)
Report given to PACU RN, vss 

## 2016-05-07 NOTE — Patient Instructions (Signed)
Discharge instructions given. Handouts on polyps,diverticulosis and hemorrhoids. Resume previous medications. YOU HAD AN ENDOSCOPIC PROCEDURE TODAY AT THE [facility]:   Refer to the procedure report that was given to you for any specific questions about what was found during the examination.  If the procedure report does not answer your questions, please call your gastroenterologist to clarify.  If you requested that your care partner not be given the details of your procedure findings, then the procedure report has been included in a sealed envelope for you to review at your convenience later.  YOU SHOULD EXPECT: Some feelings of bloating in the abdomen. Passage of more gas than usual.  Walking can help get rid of the air that was put into your GI tract during the procedure and reduce the bloating. If you had a lower endoscopy (such as a colonoscopy or flexible sigmoidoscopy) you may notice spotting of blood in your stool or on the toilet paper. If you underwent a bowel prep for your procedure, you may not have a normal bowel movement for a few days.  Please Note:  You might notice some irritation and congestion in your nose or some drainage.  This is from the oxygen used during your procedure.  There is no need for concern and it should clear up in a day or so.  SYMPTOMS TO REPORT IMMEDIATELY:   Following lower endoscopy (colonoscopy or flexible sigmoidoscopy):  Excessive amounts of blood in the stool  Significant tenderness or worsening of abdominal pains  Swelling of the abdomen that is new, acute  Fever of 100F or higher   For urgent or emergent issues, a gastroenterologist can be reached at any hour by calling [phone].   DIET: Your first meal following the procedure should be a small meal and then it is ok to progress to your normal diet. Heavy or fried foods are harder to digest and may make you feel nauseous or bloated.  Likewise, meals heavy in dairy and  vegetables can increase bloating.  Drink plenty of fluids but you should avoid alcoholic beverages for 24 hours.  ACTIVITY:  You should plan to take it easy for the rest of today and you should NOT DRIVE or use heavy machinery until tomorrow (because of the sedation medicines used during the test).    FOLLOW UP: Our staff will call the number listed on your records the next business day following your procedure to check on you and address any questions or concerns that you may have regarding the information given to you following your procedure. If we do not reach you, we will leave a message.  However, if you are feeling well and you are not experiencing any problems, there is no need to return our call.  We will assume that you have returned to your regular daily activities without incident.  If any biopsies were taken you will be contacted by phone or by letter within the next 1-3 weeks.  Please call us at [phone] if you have not heard about the biopsies in 3 weeks.    SIGNATURES/CONFIDENTIALITY: You and/or your care partner have signed paperwork which will be entered into your electronic medical record.  These signatures attest to the fact that that the information above on your After Visit Summary has been reviewed and is understood.  Full responsibility of the confidentiality of this discharge information lies with you and/or your care-partner. 

## 2016-05-08 ENCOUNTER — Telehealth: Payer: Self-pay | Admitting: *Deleted

## 2016-05-08 NOTE — Telephone Encounter (Signed)
  Follow up Call-  Call back number 05/07/2016  Post procedure Call Back phone  # #[phone] hm  Permission to leave phone message Yes     Patient questions:  Do you have a fever, pain , or abdominal swelling? No. Pain Score  0 *  Have you tolerated food without any problems? Yes.    Have you been able to return to your normal activities? Yes.    Do you have any questions about your discharge instructions: Diet   No. Medications  No. Follow up visit  No.  Do you have questions or concerns about your Care? No.  Actions: * If pain score is 4 or above: No action needed, pain <4.

## 2016-05-09 ENCOUNTER — Ambulatory Visit: Payer: No Typology Code available for payment source | Admitting: Podiatry

## 2016-05-15 ENCOUNTER — Encounter: Payer: Self-pay | Admitting: Gastroenterology

## 2016-05-17 ENCOUNTER — Telehealth: Payer: Self-pay | Admitting: Family Medicine

## 2016-05-17 NOTE — Telephone Encounter (Signed)
Patient was reached on mobile phone at 2:40 pm on 7/27. I discussed the path results of her recent colonoscopy. Path report showed benign tubular adenoma without signs of malignancy.

## 2016-05-25 ENCOUNTER — Encounter: Payer: Self-pay | Admitting: Podiatry

## 2016-05-25 ENCOUNTER — Ambulatory Visit (INDEPENDENT_AMBULATORY_CARE_PROVIDER_SITE_OTHER): Payer: No Typology Code available for payment source | Admitting: Podiatry

## 2016-05-25 DIAGNOSIS — L600 Ingrowing nail: Secondary | ICD-10-CM

## 2016-05-25 DIAGNOSIS — B351 Tinea unguium: Secondary | ICD-10-CM

## 2016-05-25 NOTE — Patient Instructions (Signed)
Can use urea cream on your toenails

## 2016-06-01 NOTE — Progress Notes (Signed)
Subjective: 50 y.o. returns the office today for painful, elongated, thickened, ingrown toenails which she cannot trim herself. Denies any redness or drainage around the nails. She states she would like to have the nails trimmed today. Denies any acute changes since last appointment and no new complaints today. Denies any systemic complaints such as fevers, chills, nausea, vomiting.   Objective: AAO 3, NAD DP/PT pulses palpable, CRT less than 3 seconds Bilateral hallux nails and the right second digit toenail is hypertrophic, dystrophic, discolored and there is incurvation of both the medial and lateral nail borders.. There is no drainage or pus. There is no surrounding edema, erythema, increase in warmth. No signs of infection. No open lesions or pre-ulcerative lesions are identified. No other areas of tenderness bilateral lower extremities. No overlying edema, erythema, increased warmth. No pain with calf compression, swelling, warmth, erythema.  Assessment: Patient presents with symptomatic ingrown toenails without signs of infection  Plan: -Treatment options including alternatives, risks, complications were discussed -Nails sharply debrided 3 without complication/bleeding. Wishes to hold off on partial nail avulsions. Can use urea cream.  -Discussed daily foot inspection. If there are any changes, to call the office immediately.  -Follow-up in 3 months or sooner if any problems are to arise. In the meantime, encouraged to call the office with any questions, concerns, changes symptoms.  Celesta Gentile, DPM

## 2016-06-13 ENCOUNTER — Ambulatory Visit (INDEPENDENT_AMBULATORY_CARE_PROVIDER_SITE_OTHER): Payer: Self-pay | Admitting: Internal Medicine

## 2016-06-13 VITALS — BP 104/67 | HR 85 | Temp 98.5°F | Ht 65.0 in | Wt 160.0 lb

## 2016-06-13 DIAGNOSIS — L600 Ingrowing nail: Secondary | ICD-10-CM

## 2016-06-13 DIAGNOSIS — Z111 Encounter for screening for respiratory tuberculosis: Secondary | ICD-10-CM

## 2016-06-13 MED ORDER — TUBERCULIN PPD 5 UNIT/0.1ML ID SOLN: 5.0000 [IU] | Freq: Once | INTRADERMAL | Status: DC

## 2016-06-13 NOTE — Patient Instructions (Signed)
Ms. Simeon Craft,  I will call you with your lab results.  Please soak your feet in epsom salt daily. Use ibuprofen as needed for pain.  Please make a nursing visit to read your TB test this Friday.  Best, Dr. Ola Spurr

## 2016-06-13 NOTE — Progress Notes (Signed)
There was a red irritated spot on the left forearm above the place where the PPD was administered.  Patient stated that the mark has been there and it has nothing to do with the PPD site.  It is about 3 in or so above it.Kristina Moreno

## 2016-06-13 NOTE — Progress Notes (Signed)
[facility] Progress Note  Subjective:  Kristina Moreno is a 50-y/o female who presents for SDA for continued foot pain.   Foot pain: - Began after a bad pedicure 4 months ago; worst in both big toes and feels like an intense soreness when anything touches toes - Has been following with a podiatrist who has been cutting her nails down; says he recommended using a solution to decrease thickness of nails - Reports that clippings of her nails have been tested for fungus and were negative - Does not want toenail removal at this time because of risk toenails will not grow back; does think toenails are becoming less thick  - Has occasionally be soaking her feet in epsom salts - Is worried she could have diabetes because skin around toenails is still inflamed; has family members with prediabetes  - Showed picture on her phone of big toes with worse erythema and swelling of lateral edge of nails than present from a couple of months ago  Social: Is planning to adopt her 10-year-old niece. Says she needs a TB test as part of the process. Former smoker. Does use marijuana.   No Known Allergies  Objective: Blood pressure 104/67, pulse 85, temperature 98.5 F (36.9 C), temperature source Oral, height 5\' 5"  (1.651 m), weight 160 lb (72.6 kg), SpO2 99 %. Constitutional: Well-appearing female, in NAD HENT: NCAT, oropharynx normal  Cardiovascular: RRR, S1, S2, no m/r/g.  Pulmonary/Chest: Effort normal and breath sounds normal. No respiratory distress.  Abdominal: Soft. +BS, NT, ND, no rebound or guarding.  Musculoskeletal: No LE edema Neurological: AOx3, no focal deficits. Skin: Skin surrounding bilateral halluces mildly tender and erythematous, at both medial and lateral edges of nails. No pus. No increased warmth.  Psychiatric: Normal mood and anxious affect.  Vitals reviewed  Assessment/Plan: Patient requested multiple labs and full physical (had made SDA) but orange card had expired  and did not want to pay out of pocket. TB test administered, however, and patient will return in 2 days for read.   Ingrown nail - Bilaterally. No evidence of paronychia. Recommended removal for pain relief, but patient refused at this time as pain has improved somewhat with routine nail cutting with podiatry and foot soaks. - Recommended increasing frequency of foot soaks and taking ibuprofen/tyelnol for pain.  Follow-up prn.  Olene Floss, MD [place], PGY-2

## 2016-06-15 ENCOUNTER — Encounter: Payer: Self-pay | Admitting: *Deleted

## 2016-06-15 ENCOUNTER — Ambulatory Visit (INDEPENDENT_AMBULATORY_CARE_PROVIDER_SITE_OTHER): Payer: Self-pay | Admitting: *Deleted

## 2016-06-15 DIAGNOSIS — Z7689 Persons encountering health services in other specified circumstances: Secondary | ICD-10-CM

## 2016-06-15 DIAGNOSIS — Z111 Encounter for screening for respiratory tuberculosis: Secondary | ICD-10-CM

## 2016-06-15 LAB — TB SKIN TEST
INDURATION: 0 mm
TB Skin Test: NEGATIVE

## 2016-06-15 NOTE — Progress Notes (Signed)
   PPD Reading Note PPD read and results entered in Epic[employer]. Result: 0 mm induration. Interpretation: Negative If test not read within 48-72 hours of initial placement, patient advised to repeat in other arm 1-3 weeks after this test. Allergic reaction: no  Jakwon Gayton L, RN  

## 2016-06-16 ENCOUNTER — Encounter: Payer: Self-pay | Admitting: Internal Medicine

## 2016-06-16 DIAGNOSIS — L600 Ingrowing nail: Secondary | ICD-10-CM | POA: Insufficient documentation

## 2016-06-16 NOTE — Assessment & Plan Note (Signed)
-   Bilaterally. No evidence of paronychia. Recommended removal for pain relief, but patient refused at this time as pain has improved somewhat with routine nail cutting with podiatry and foot soaks. - Recommended increasing frequency of foot soaks and taking ibuprofen/tyelnol for pain.

## 2016-07-03 ENCOUNTER — Ambulatory Visit: Payer: Self-pay

## 2016-07-03 ENCOUNTER — Ambulatory Visit: Payer: Self-pay | Attending: Internal Medicine

## 2016-07-04 ENCOUNTER — Ambulatory Visit (INDEPENDENT_AMBULATORY_CARE_PROVIDER_SITE_OTHER): Payer: Self-pay | Admitting: *Deleted

## 2016-07-04 DIAGNOSIS — Z23 Encounter for immunization: Secondary | ICD-10-CM

## 2016-07-19 ENCOUNTER — Ambulatory Visit: Payer: Self-pay | Admitting: Family Medicine

## 2016-07-23 NOTE — Progress Notes (Signed)
Subjective:   Patient ID: Kristina Moreno    DOB: [birth year], 50 y.o. female   MRN: [id number]  CC: Bipolar   HPI: Kristina Moreno is a 50 y.o. female who presents to clinic today feelings of anxiety with a h/o bipolar type 1. Problems discussed today are as follows:  1. Anxiety: dx of bipolar type 1, diagnosed about 4 years ago by Dr. Berniece Andreas but stopped due to losing medicaid (now has orange card), put on xanax taking 1 per day after failing Abilify. Says she will cry if she does not take her xanax. No feelings of depression or suicidal thoughts or ideations. Sleeps well with normal appetite. Walks daily about 2 miles. 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 Patient says she broke up with boyfriend a few months ago but "got over it."  2. Big Toe Pain: saw podiatry with nail debridement but declined partial nail avulsion. Told to use urea cream and f/u in 3 months. Patient says toe still gives her trouble but will try cream before considering nail removal. No new complaints. Denies fevers, chills, loss of feeling or color in toe, or discharge.  ROS: See HPI for pertinent ROS.  [place]: Pertinent past medical, surgical, family, and social history were reviewed and updated as appropriate. Smoking status reviewed.  Medications reviewed. Current Outpatient Prescriptions  Medication Sig Dispense Refill  . albuterol (PROAIR HFA) 108 (90 Base) MCG/ACT inhaler INHALE 2 PUFFS INTO THE LUNGS EVERY 6 HOURS AS NEEDED FOR SHORTNESS OF BREATH/ASTHMA 8.5 g 5  . ALPRAZolam (XANAX) 0.5 MG tablet TAKE 1/2 TABLET BY MOUTH 3 TIMES A DAY AS NEEDED FOR ANXIETY 45 tablet 2  . ARIPiprazole (ABILIFY) 5 MG tablet Take 1 tablet (5 mg total) by mouth daily. 30 tablet 2  . hydrochlorothiazide (MICROZIDE) 12.5 MG capsule TAKE 1 CAPSULE BY MOUTH DAILY 360 capsule 0  . hydrocortisone cream 1 % Apply topically 2 (two) times daily. 30 g 0  . loratadine (CLARITIN) 10 MG tablet Take 1 tablet (10 mg total) by mouth  daily. (Patient not taking: Reported on 05/07/2016) 30 tablet 11  . mometasone-formoterol (DULERA) 200-5 MCG/ACT AERO Inhale 2 puffs into the lungs 2 (two) times daily. (Patient not taking: Reported on 05/07/2016) 13 g 11  . Multiple Vitamins-Minerals (MULTIVITAMIN WITH MINERALS) tablet Take 1 tablet by mouth daily. Reported on 05/07/2016    . naproxen (NAPROSYN) 500 MG tablet Take 1 tablet (500 mg total) by mouth 2 (two) times daily with a meal. For 2 weeks, then as needed (Patient not taking: Reported on 05/07/2016) 60 tablet 2  . triamcinolone cream (KENALOG) 0.1 % Apply a thin layer to affected area twice daily. Up to 2 weeks. 80 g 1  . zoster vaccine live, PF, (ZOSTAVAX) 60454 UNT/0.65ML injection Inject 19,400 Units into the skin once. (Patient not taking: Reported on 05/07/2016) 1 each 0   No current facility-administered medications for this visit.     Objective:   BP 111/73 (BP Location: Left Arm, Patient Position: Sitting, Cuff Size: Normal)   Pulse 85   Temp 98.1 F (36.7 C) (Oral)   Ht 5\' 5"  (1.651 m)   Wt 158 lb (71.7 kg)   BMI 26.29 kg/m  Vitals and nursing note reviewed.  General: well nourished, well developed, in no acute distress with non-toxic appearance HEENT: normocephalic, atraumatic, moist mucous membranes Neck: supple, non-tender without lymphadenopathy CV: regular rate and rhythm without murmurs, rubs, or gallops Lungs: clear to auscultation bilaterally with  normal work of breathing Abdomen: soft, non-tender, non-distended, no masses or organomegaly palpable, normoactive bowel sounds Skin: warm, dry, no rashes or lesions, cap refill < 2 seconds Extremities: warm and well perfused, normal tone, toes are symmetrical bilaterally without erythema or edema Psych: pressured speech, avoids eye contact, pacing in room upon entering  Assessment & Plan:   Moderate mixed bipolar I disorder (HCC) Uncontrolled, chronic. Formerly diagnosed and seen by psychiatry Dr. Berniece Andreas. Not been seen in years due to loosing medicaid. Only medication is Xanax (#45 pills/month). No depressive or suicidal feeling at present. MDQ score 12, moderate problem per patient. - Encouraged patient to stop Xanax due to inadequate and inappropriate treatment of symptoms - On orange card, patient is to call [facility] for psych follow up and consideration of mood stabilizer - Patient to follow up with me in 1 month - Discussed this with Bonnetta Barry in clinic, other options include [facility] psych clinic or [place] if needed  Ingrown nail Chronic, stable. Saw podiatry who recommended removal but pt declined. Patient considers taking urea cream instead. No new complaints or symptoms of infection. - Continue follow up with podiatry - Urea cream PRN, consider nail avulsion if failed  No orders of the defined types were placed in this encounter.  No orders of the defined types were placed in this encounter.   Harriet Butte, [place], PGY-1 07/24/2016 10:31 AM

## 2016-07-24 ENCOUNTER — Ambulatory Visit (INDEPENDENT_AMBULATORY_CARE_PROVIDER_SITE_OTHER): Payer: Self-pay | Admitting: Family Medicine

## 2016-07-24 ENCOUNTER — Encounter: Payer: Self-pay | Admitting: Family Medicine

## 2016-07-24 DIAGNOSIS — L600 Ingrowing nail: Secondary | ICD-10-CM

## 2016-07-24 DIAGNOSIS — F3162 Bipolar disorder, current episode mixed, moderate: Secondary | ICD-10-CM

## 2016-07-24 NOTE — Progress Notes (Signed)
Dr. Yisroel Ramming requested a [place].   Presenting Issue:  Patient reported being diagnosed a couple of years ago with bipolar disorder, but not currently seeing psychiatrist or psychologist. Greatest concern today is referral for this patient.   Psychiatric History - Diagnoses: Bipolar disorder (per pt report) - Hospitalizations: not assessed - Pharmacotherapy: Xanax - Outpatient therapy: not currently seeing therapist or psychiatrist but would like to  Assessment / Plan / Recommendations: Given that patient doesn't have insurance, [facility] discussed Medicaid's open access program and gave her printed information. Patient states that she has been to [facility] before with her son and is open to going for herself, reporting that she was already thinking of going there per a recommendation from a family member. She asked about the quality of care there, stating that she wants someone to really listen to her, and [facility] stated that if she is unhappy with quality of care she receives at [facility] for whatever reason, she can call [facility] to schedule appt to meet with [facility] to discuss other options, but emphasized that [facility] is her best option currently. [facility] will follow up by phone in a couple of weeks.

## 2016-07-24 NOTE — Patient Instructions (Signed)
It was a pleasure to meet you today. Please see below to review our plan for today's visit.  1. Please go to [facility] for evaluation of your bi-polar, this is your best option at this point given your limitations with the orange card, they should be able to get you on an appropriate medications 2. Stop Xanax as this is not good medicine for your condition 3. Please follow up with me in 1 month  Please call the clinic at [phone] if your symptoms worsen or you have any concerns. It was my pleasure to see you. -- Harriet Butte, [place], PGY-1

## 2016-07-24 NOTE — Assessment & Plan Note (Signed)
Uncontrolled, chronic. Formerly diagnosed and seen by psychiatry Dr. Berniece Andreas. Not been seen in years due to loosing medicaid. Only medication is Xanax (#45 pills/month). No depressive or suicidal feeling at present. MDQ score 12, moderate problem per patient. - Encouraged patient to stop Xanax due to inadequate and inappropriate treatment of symptoms - On orange card, patient is to call [facility] for psych follow up and consideration of mood stabilizer - Patient to follow up with me in 1 month - Discussed this with Bonnetta Barry in clinic, other options include [facility] psych clinic or [place] if needed

## 2016-07-24 NOTE — Assessment & Plan Note (Signed)
Chronic, stable. Saw podiatry who recommended removal but pt declined. Patient considers taking urea cream instead. No new complaints or symptoms of infection. - Continue follow up with podiatry - Urea cream PRN, consider nail avulsion if failed

## 2016-08-03 ENCOUNTER — Ambulatory Visit (HOSPITAL_COMMUNITY)
Admission: EM | Admit: 2016-08-03 | Discharge: 2016-08-03 | Disposition: A | Discharge status: Home / Self Care | Payer: Self-pay | Attending: Emergency Medicine | Admitting: Emergency Medicine

## 2016-08-03 ENCOUNTER — Encounter (HOSPITAL_COMMUNITY): Payer: Self-pay | Admitting: Emergency Medicine

## 2016-08-03 DIAGNOSIS — K047 Periapical abscess without sinus: Secondary | ICD-10-CM

## 2016-08-03 DIAGNOSIS — K0889 Other specified disorders of teeth and supporting structures: Secondary | ICD-10-CM

## 2016-08-03 MED ORDER — IBUPROFEN 800 MG PO TABS
800.0000 mg | ORAL_TABLET | Freq: Once | ORAL | Status: AC
  Administered 2016-08-03: 800 mg via ORAL

## 2016-08-03 MED ORDER — AMOXICILLIN 500 MG PO CAPS: 500.0000 mg | ORAL_CAPSULE | Freq: Two times a day (BID) | ORAL | 0 refills | Status: DC

## 2016-08-03 MED ORDER — IBUPROFEN 800 MG PO TABS
ORAL_TABLET | ORAL | Status: AC
  Filled 2016-08-03: qty 1

## 2016-08-03 MED ORDER — TRAMADOL HCL 50 MG PO TABS: 50.0000 mg | ORAL_TABLET | Freq: Four times a day (QID) | ORAL | 0 refills | Status: DC | PRN

## 2016-08-03 NOTE — ED Provider Notes (Signed)
CSN: [id number]     Arrival date & time 08/03/16  45 History   First MD Initiated Contact with Patient 08/03/16 1840     Chief Complaint  Patient presents with  . Dental Pain   (Consider location/radiation/quality/duration/timing/severity/associated sxs/prior Treatment) Patient presents with dental pain along the left lower gum line. She was flossing with a floss stick and "pricked" under the brokoen tooth. Since that time she has reported tooth and gum pain. No swelling or redness. At times radiates into neck. No fever or chills are noted. She has an appt with a dentist next week.       Past Medical History:  Diagnosis Date  . Anxiety   . Asthma   . Contact dermatitis   . COPD (chronic obstructive pulmonary disease) ([place])   . Meniere disease   . Sciatica    Past Surgical History:  Procedure Laterality Date  . TOTAL ABDOMINAL HYSTERECTOMY  04/03/2006   Pathology revealed benign uterus and cervix   Family History  Problem Relation Age of Onset  . Anesthesia problems Neg Hx   . Hypotension Neg Hx   . Malignant hyperthermia Neg Hx   . Pseudochol deficiency Neg Hx   . Colon cancer Neg Hx   . Esophageal cancer Neg Hx   . Rectal cancer Neg Hx   . Stomach cancer Neg Hx   . Leukemia Father   . Colon polyps Paternal Grandfather    Social History  Substance Use Topics  . Smoking status: Former Research scientist (life sciences)  . Smokeless tobacco: Never Used  . Alcohol use 0.0 oz/week     Comment: occasionally   OB History    Gravida Para Term Preterm AB Living   3 2 2   1 2    SAB TAB Ectopic Multiple Live Births     1           Review of Systems  Constitutional: Negative for chills and fever.  HENT: Positive for dental problem.     Allergies  Review of patient's allergies indicates no known allergies.  Home Medications   Prior to Admission medications   Medication Sig Start Date End Date Taking? Authorizing Provider  albuterol (PROAIR HFA) 108 (90 Base) MCG/ACT inhaler INHALE 2  PUFFS INTO THE LUNGS EVERY 6 HOURS AS NEEDED FOR SHORTNESS OF BREATH/ASTHMA 12/30/15   Olin Hauser, DO  ALPRAZolam Duanne Moron) 0.5 MG tablet TAKE 1/2 TABLET BY MOUTH 3 TIMES A DAY AS NEEDED FOR ANXIETY 03/21/16   Olin Hauser, DO  amoxicillin (AMOXIL) 500 MG capsule Take 1 capsule (500 mg total) by mouth 2 (two) times daily. 08/03/16   Bjorn Pippin, PA-C  ARIPiprazole (ABILIFY) 5 MG tablet Take 1 tablet (5 mg total) by mouth daily. 11/08/14 04/23/16  Kathlee Nations, MD  hydrochlorothiazide (MICROZIDE) 12.5 MG capsule TAKE 1 CAPSULE BY MOUTH DAILY 02/09/16   Olin Hauser, DO  hydrocortisone cream 1 % Apply topically 2 (two) times daily. 09/16/12   Seabron Spates, CNM  loratadine (CLARITIN) 10 MG tablet Take 1 tablet (10 mg total) by mouth daily. Patient not taking: Reported on 05/07/2016 01/28/14   Melony Overly, MD  mometasone-formoterol [facility]) 200-5 MCG/ACT AERO Inhale 2 puffs into the lungs 2 (two) times daily. Patient not taking: Reported on 05/07/2016 12/30/15   Olin Hauser, DO  Multiple Vitamins-Minerals (MULTIVITAMIN WITH MINERALS) tablet Take 1 tablet by mouth daily. Reported on 05/07/2016    Historical Provider, MD  naproxen (NAPROSYN) 500 MG tablet  Take 1 tablet (500 mg total) by mouth 2 (two) times daily with a meal. For 2 weeks, then as needed Patient not taking: Reported on 05/07/2016 02/06/16   Olin Hauser, DO  traMADol (ULTRAM) 50 MG tablet Take 1 tablet (50 mg total) by mouth every 6 (six) hours as needed. 08/03/16   Bjorn Pippin, PA-C  triamcinolone cream (KENALOG) 0.1 % Apply a thin layer to affected area twice daily. Up to 2 weeks. 03/12/16   Olin Hauser, DO  zoster vaccine live, PF, (ZOSTAVAX) 60454 UNT/0.65ML injection Inject 19,400 Units into the skin once. Patient not taking: Reported on 05/07/2016 12/01/14   Olin Hauser, DO   Meds Ordered and Administered this Visit   Medications  ibuprofen  (ADVIL,MOTRIN) tablet 800 mg (800 mg Oral Given 08/03/16 1909)    BP 143/84 (BP Location: Right Arm)   Pulse 60   Temp 98.6 F (37 C) (Oral)   Resp 14   SpO2 100%  No data found.   Physical Exam  Constitutional: She appears well-developed and well-nourished. No distress.  HENT:  Head: Normocephalic and atraumatic.  Mouth/Throat: Oropharynx is clear and moist.  Left lower dental carries are noted with broken teeth, pain with loose movement of left lower molar, no frank abscess is noted  Neck: Normal range of motion.  Lymphadenopathy:    She has no cervical adenopathy.  Skin: Skin is warm and dry. She is not diaphoretic.  Psychiatric: Her behavior is normal.  Nursing note and vitals reviewed.   Urgent Care Course   Clinical Course    Procedures (including critical care time)  Labs Review Labs Reviewed - No data to display  Imaging Review No results found.   Visual Acuity Review  Right Eye Distance:   Left Eye Distance:   Bilateral Distance:    Right Eye Near:   Left Eye Near:    Bilateral Near:         MDM   1. Pain, dental   2. Dental infection    No frank abscess, though given severity of pain will empirically treat with antibiotic therapy. She is instructed to take Ibuprofen and Tramadol is given for severe pain. F/U with dentist as scheduled. Any worsening signs then proceed to ED.     Bjorn Pippin, PA-C 08/03/16 1911

## 2016-08-03 NOTE — ED Triage Notes (Signed)
The patient presented to the [facility] with a complaint of dental pain secondary to cutting her gum with a floss stick 3 days ago.

## 2016-08-06 ENCOUNTER — Ambulatory Visit (INDEPENDENT_AMBULATORY_CARE_PROVIDER_SITE_OTHER): Payer: No Typology Code available for payment source | Admitting: Family Medicine

## 2016-08-06 ENCOUNTER — Encounter: Payer: Self-pay | Admitting: Family Medicine

## 2016-08-06 VITALS — BP 135/84 | HR 72 | Temp 98.4°F | Wt 161.0 lb

## 2016-08-06 DIAGNOSIS — R42 Dizziness and giddiness: Secondary | ICD-10-CM

## 2016-08-06 DIAGNOSIS — R200 Anesthesia of skin: Secondary | ICD-10-CM

## 2016-08-06 LAB — GLUCOSE, CAPILLARY: Glucose-Capillary: 129 mg/dL — ABNORMAL HIGH (ref 65–99)

## 2016-08-06 MED ORDER — GABAPENTIN 100 MG PO CAPS: 100.0000 mg | ORAL_CAPSULE | Freq: Three times a day (TID) | ORAL | 0 refills | Status: DC

## 2016-08-06 NOTE — Progress Notes (Signed)
Subjective: CC: left foot pain HPI: Patient is a 50 y.o. female with a past medical history of anxiety, THC use, COPD, bipolar disorder presenting to clinic today for a same day appt for numb foot.  Right foot numbness: notes she has decreased sensation in the medial aspect of her R foot. States that it all started on Sunday morning. She notes that she was out dancing on Saturday evening.  At that time, she was wearing thick, 2 inch wooden heels. She denied any pain during that time.  No weakness noted. She does have some sensation, but notes that it isdecreased. She denies any pain or tingling. No swelling or other abnormalities noted by her. Never had anything like this before. She is very anxious about this and would like it gone now.  Social History: Former smoker  ROS: All other systems reviewed and are negative besides the above and: Patient notes she knows her blood sugars drop. She states she is mentioning this to her PCP several times who has disregarded. She notes that she intermittently gets "blue vision" and after that will follow out if she doesn't eat. She would like this addressed once and for all. She is had coffee with sugar this morning.  Past Medical History Patient Active Problem List   Diagnosis Date Noted  . Ingrown nail 06/16/2016  . Moderate mixed bipolar I disorder ([place]) 04/23/2016  . Hyperlipidemia 02/06/2016  . Left-sided low back pain with sciatica 12/30/2015  . Screening for colon cancer 01/05/2015  . Screening for HIV (human immunodeficiency virus) 01/05/2015  . Anxiety state 06/29/2014  . Rash and nonspecific skin eruption 01/11/2014  . Meniere disease 10/30/2012  . Motion sickness 04/06/2011  . Vertigo 02/20/2011  . PANIC ATTACK 06/07/2010  . ALLERGIC RHINITIS 09/02/2008  . Cannabis abuse 05/15/2007  . COPD (chronic obstructive pulmonary disease) ([place]) 05/15/2007    Medications- reviewed and updated  Objective: Office vital signs reviewed. BP  135/84   Pulse 72   Temp 98.4 F (36.9 C) (Oral)   Wt 161 lb (73 kg)   SpO2 100%   BMI 26.79 kg/m    Physical Examination:  General: Awake, alert, well- nourished, NAD Right foot: Normal to inspection without any swelling or erythema. Full range of active and passive motion. 5 out of 5 strength in dorsi and plantarflexion, as well as eversion and inversion of the ankle. She notes decreased sensation to light touch and pinprick over the medial aspect, from the great toe medially. This tends to and at the beginning of the heel and it does not progress to the plantar aspect. Symmetric Achilles DTR bilaterally.  Assessment/Plan: No problem-specific Assessment & Plan notes found for this encounter. Decreased sensation over the medial aspect of the right foot: This is most likely secondary to impingement of a superficial nerve. There are no worrisome signs or symptoms, she is otherwise neurologically intact. She denies any back pain. She has normal DTRs as well. We discussed that this would most likely resolve on its own. The patient would like for it to be resolved quicker. I discussed that we could try a trial of gabapentin 100 mg q8 hours to see if this helps with the neuropathic symptoms. She was agreeable to this plan.  Patient concerns of hypoglycemia/diabetes: She notices a significant family history of diabetes. No polyuria or polydipsia noted. I noted that at this point, she's not had any abnormal CBGs noted in her chart. We can check a CBG today. It is  less helpful as she is not hypoglycemic feeling. Advised her if it is not incredibly low or high that she should just follow-up with her PCP about her continued concerns.  Orders Placed This Encounter  Procedures  . Glucose, capillary  . POCT glucose (manual entry)    Meds ordered this encounter  Medications  . gabapentin (NEURONTIN) 100 MG capsule    Sig: Take 1 capsule (100 mg total) by mouth 3 (three) times daily.    Dispense:  90  capsule    Refill:  [place] PGY-3, [place]

## 2016-08-06 NOTE — Patient Instructions (Addendum)
It was nice to meet you.  I suspect that the numbness to her having of your right foot is secondary to mild superficial nerve damage that goes to your skin. It will most likely was on it on I have prescribed gabapentin, you can take it 3 times per day. It may make you sleepy. I would like for you to follow-up with your PCP in 2 weeks to determine whether or not you need to continue this medication. I have checked your blood sugar today, as long as it is 80 or above I am happy with this number. You can talk to your PCP if you are still having more concerns about diabetes and/or hypoglycemia  If you note any other changes in sensation or weakness of one extremity, please seek medical care.

## 2016-08-07 ENCOUNTER — Other Ambulatory Visit: Payer: Self-pay | Admitting: Family Medicine

## 2016-08-07 DIAGNOSIS — F411 Generalized anxiety disorder: Secondary | ICD-10-CM

## 2016-08-13 NOTE — Telephone Encounter (Signed)
I discussed use of this medication during our last visit. Patient was told this medication would not help her problem and I recommended patient not take this anymore. I see she is scheduled for next appointment on 08/21/16. We can revisit the medication at that time. -- Harriet Butte, [place], PGY-1

## 2016-08-13 NOTE — Telephone Encounter (Signed)
Will be getting orange card tomorrow.  Would like a referral to the dentist that takes orange card

## 2016-08-14 ENCOUNTER — Telehealth: Payer: Self-pay | Admitting: Family Medicine

## 2016-08-14 ENCOUNTER — Other Ambulatory Visit: Payer: Self-pay | Admitting: Family Medicine

## 2016-08-14 DIAGNOSIS — F411 Generalized anxiety disorder: Secondary | ICD-10-CM

## 2016-08-14 NOTE — Telephone Encounter (Signed)
Called patient after she came to [facility] demanding a refill for her Xanax to treat her bipolar. This prescription was declined yesterday after discussing with the patient earlier on 07/24/16 about the unsuccessful treatment and bad medical care this medication had for her problem. She has been prescribed this medication since 2011 and was intermittently discontinued because it was ineffective and the patient was told this would be a short term medication. This was discussed with the patient over the phone who was upset and said she would reach out to [facility] in the morning for behavioral medicine care. I told the patient this was a great choice and that she can follow up with me on her next appointment 08/21/16. I do not have concerns for benzo withdrawal as this patient was not taking the medication more than once per day per patient during last visit. -- Harriet Butte, [place], PGY-1

## 2016-08-14 NOTE — Telephone Encounter (Signed)
Pt called very upset because dr Yisroel Ramming is not refilling her medication.  I tried to read her the note he had written but she continued to yell.  I told her I would send a note back to the dr.  She hung up during the phone call. I am not sure what she wanted me to do

## 2016-08-21 ENCOUNTER — Inpatient Hospital Stay (HOSPITAL_COMMUNITY)
Admission: AD | Admit: 2016-08-21 | Discharge: 2016-08-21 | Disposition: A | Discharge status: Home / Self Care | Payer: Self-pay | Source: Ambulatory Visit | Attending: Family Medicine | Admitting: Family Medicine

## 2016-08-21 ENCOUNTER — Encounter (HOSPITAL_COMMUNITY): Payer: Self-pay

## 2016-08-21 ENCOUNTER — Ambulatory Visit: Payer: Self-pay | Admitting: Family Medicine

## 2016-08-21 DIAGNOSIS — N760 Acute vaginitis: Secondary | ICD-10-CM

## 2016-08-21 DIAGNOSIS — Z113 Encounter for screening for infections with a predominantly sexual mode of transmission: Secondary | ICD-10-CM | POA: Insufficient documentation

## 2016-08-21 DIAGNOSIS — B9689 Other specified bacterial agents as the cause of diseases classified elsewhere: Secondary | ICD-10-CM

## 2016-08-21 DIAGNOSIS — Z87891 Personal history of nicotine dependence: Secondary | ICD-10-CM | POA: Insufficient documentation

## 2016-08-21 DIAGNOSIS — Z90710 Acquired absence of both cervix and uterus: Secondary | ICD-10-CM | POA: Insufficient documentation

## 2016-08-21 LAB — WET PREP, GENITAL
Sperm: NONE SEEN
Trich, Wet Prep: NONE SEEN
Yeast Wet Prep HPF POC: NONE SEEN

## 2016-08-21 LAB — URINALYSIS, ROUTINE W REFLEX MICROSCOPIC
BILIRUBIN URINE: NEGATIVE
GLUCOSE, UA: NEGATIVE mg/dL
KETONES UR: NEGATIVE mg/dL
LEUKOCYTES UA: NEGATIVE
Nitrite: NEGATIVE
PH: 5.5 (ref 5.0–8.0)
PROTEIN: NEGATIVE mg/dL
Specific Gravity, Urine: >1.03 — ABNORMAL HIGH (ref 1.005–1.030)

## 2016-08-21 LAB — CBC
HCT: 38.5 % (ref 36.0–46.0)
Hemoglobin: 12.6 g/dL (ref 12.0–15.0)
MCH: 27 pg (ref 26.0–34.0)
MCHC: 32.7 g/dL (ref 30.0–36.0)
MCV: 82.4 fL (ref 78.0–100.0)
PLATELETS: 240 10*3/uL (ref 150–400)
RBC: 4.67 MIL/uL (ref 3.87–5.11)
RDW: 16.2 % — AB (ref 11.5–15.5)
WBC: 5.9 10*3/uL (ref 4.0–10.5)

## 2016-08-21 LAB — HCG, QUANTITATIVE, PREGNANCY: HCG, BETA CHAIN, QUANT, S: 5 m[IU]/mL — AB (ref <5)

## 2016-08-21 LAB — POCT PREGNANCY, URINE: Preg Test, Ur: POSITIVE — AB

## 2016-08-21 LAB — URINE MICROSCOPIC-ADD ON

## 2016-08-21 MED ORDER — METRONIDAZOLE 500 MG PO TABS: 500.0000 mg | ORAL_TABLET | Freq: Two times a day (BID) | ORAL | 0 refills | Status: DC

## 2016-08-21 NOTE — Discharge Instructions (Signed)

## 2016-08-21 NOTE — MAU Note (Signed)
Pt presents to MAU with a burning in her vagina. Denies any abnormal discharge or odor. Pt reports feeling like she always has to pee, but only happens at night. Began Sunday night.

## 2016-08-21 NOTE — MAU Provider Note (Signed)
History     CSN: [id number]  Arrival date and time: 08/21/16 0905   First Provider Initiated Contact with Patient 08/21/16 [phone]      Chief Complaint  Patient presents with  . Urinary Urgency  . Vaginitis  . STD screen   HPI Kristina Moreno is a 50 y.o. [id number] female who presents with urinary complaints, vaginal irritation, & STD testing. Symptoms began Sunday. Pt reports urinary urgency that is worse at night. Denies dysuria, hematuria, n/v, fever, or flank pain.  Also reports vaginal irritation. Has noted itching but no discharge. Denies bleeding. Pt has hx of TAH.  Pt is sexually active & does not use condoms. Is requesting STD testing today. Reports hx of treatable STIs 15+ years ago.   Past Medical History:  Diagnosis Date  . Anxiety   . Asthma   . Contact dermatitis   . COPD (chronic obstructive pulmonary disease) ([place])   . Meniere disease   . Sciatica     Past Surgical History:  Procedure Laterality Date  . TOTAL ABDOMINAL HYSTERECTOMY  04/03/2006   Pathology revealed benign uterus and cervix    Family History  Problem Relation Age of Onset  . Leukemia Father   . Colon polyps Paternal Grandfather   . Anesthesia problems Neg Hx   . Hypotension Neg Hx   . Malignant hyperthermia Neg Hx   . Pseudochol deficiency Neg Hx   . Colon cancer Neg Hx   . Esophageal cancer Neg Hx   . Rectal cancer Neg Hx   . Stomach cancer Neg Hx     Social History  Substance Use Topics  . Smoking status: Former Research scientist (life sciences)  . Smokeless tobacco: Never Used  . Alcohol use 0.0 oz/week     Comment: occasionally    Allergies: No Known Allergies  No prescriptions prior to admission.    Review of Systems  Constitutional: Negative.   Gastrointestinal: Negative.   Genitourinary: Positive for urgency. Negative for dysuria, flank pain, frequency and hematuria.       + vaginal irritation   Physical Exam   Blood pressure 125/81, pulse 81, temperature 98.2 F (36.8 C), temperature  source Oral, resp. rate 16, height 5\' 5"  (1.651 m), weight 161 lb (73 kg).  Physical Exam  Nursing note and vitals reviewed. Constitutional: She is oriented to person, place, and time. She appears well-developed and well-nourished. No distress.  HENT:  Head: Normocephalic and atraumatic.  Eyes: Conjunctivae are normal. Right eye exhibits no discharge. Left eye exhibits no discharge. No scleral icterus.  Neck: Normal range of motion.  Cardiovascular: Normal rate.   Respiratory: Effort normal. No respiratory distress.  GI: Soft. She exhibits no distension and no mass. There is no tenderness. There is no rebound and no guarding.  Genitourinary: There is no rash, tenderness or lesion on the right labia. There is no rash, tenderness or lesion on the left labia. Right adnexum displays no mass and no tenderness. Left adnexum displays no mass and no tenderness. No erythema, tenderness or bleeding in the vagina. Vaginal discharge (moderate amount of thin white frothy discharge) found.  Neurological: She is alert and oriented to person, place, and time.  Skin: Skin is warm and dry. She is not diaphoretic.  Psychiatric: She has a normal mood and affect. Her behavior is normal. Judgment and thought content normal.    MAU Course  Procedures Results for orders placed or performed during the hospital encounter of 08/21/16 (from the past 24 hour(s))  Urinalysis,  Routine w reflex microscopic (not at [facility])     Status: Abnormal   Collection Time: 08/21/16  9:08 AM  Result Value Ref Range   Color, Urine YELLOW YELLOW   APPearance CLEAR CLEAR   Specific Gravity, Urine >1.030 (H) 1.005 - 1.030   pH 5.5 5.0 - 8.0   Glucose, UA NEGATIVE NEGATIVE mg/dL   Hgb urine dipstick TRACE (A) NEGATIVE   Bilirubin Urine NEGATIVE NEGATIVE   Ketones, ur NEGATIVE NEGATIVE mg/dL   Protein, ur NEGATIVE NEGATIVE mg/dL   Nitrite NEGATIVE NEGATIVE   Leukocytes, UA NEGATIVE NEGATIVE  Urine microscopic-add on     Status:  Abnormal   Collection Time: 08/21/16  9:08 AM  Result Value Ref Range   Squamous Epithelial / LPF 0-5 (A) NONE SEEN   WBC, UA 0-5 0 - 5 WBC/hpf   RBC / HPF 0-5 0 - 5 RBC/hpf   Bacteria, UA FEW (A) NONE SEEN   Urine-Other MUCOUS PRESENT   Pregnancy, urine POC     Status: Abnormal   Collection Time: 08/21/16  9:14 AM  Result Value Ref Range   Preg Test, Ur POSITIVE (A) NEGATIVE  CBC     Status: Abnormal   Collection Time: 08/21/16 10:03 AM  Result Value Ref Range   WBC 5.9 4.0 - 10.5 K/uL   RBC 4.67 3.87 - 5.11 MIL/uL   Hemoglobin 12.6 12.0 - 15.0 g/dL   HCT 38.5 36.0 - 46.0 %   MCV 82.4 78.0 - 100.0 fL   MCH 27.0 26.0 - 34.0 pg   MCHC 32.7 30.0 - 36.0 g/dL   RDW 16.2 (H) 11.5 - 15.5 %   Platelets 240 150 - 400 K/uL  hCG, quantitative, pregnancy     Status: Abnormal   Collection Time: 08/21/16 10:03 AM  Result Value Ref Range   hCG, Beta Chain, Quant, S 5 (H) <5 mIU/mL  Wet prep, genital     Status: Abnormal   Collection Time: 08/21/16 10:20 AM  Result Value Ref Range   Yeast Wet Prep HPF POC NONE SEEN NONE SEEN   Trich, Wet Prep NONE SEEN NONE SEEN   Clue Cells Wet Prep HPF POC PRESENT (A) NONE SEEN   WBC, Wet Prep HPF POC FEW (A) NONE SEEN   Sperm NONE SEEN     MDM + upt in pt w/hysterectomy -- BHCG ordered ---5 CBC, RPR, HIV, Hep C, GC/CT, wet prep U/a pending -- urine cx sent d/t symptoms Will tx for BV  Assessment and Plan  A:  1. BV (bacterial vaginosis)   2. Screen for STD (sexually transmitted disease)    P: Discharge home Rx flagyl (no alcohol) GC/CT, HIV, RPR, HCV pending Pt missed her appt this morning with PCP to come here --- discussed rescheduling appt with PCP   Jorje Guild 08/21/2016, 9:32 AM

## 2016-08-22 LAB — URINE CULTURE

## 2016-08-22 LAB — HIV ANTIBODY (ROUTINE TESTING W REFLEX): HIV SCREEN 4TH GENERATION: NONREACTIVE

## 2016-08-22 LAB — RPR: RPR: NONREACTIVE

## 2016-08-22 LAB — GC/CHLAMYDIA PROBE AMP (~~LOC~~) NOT AT ARMC
CHLAMYDIA, DNA PROBE: NEGATIVE
NEISSERIA GONORRHEA: NEGATIVE

## 2016-08-22 LAB — HEPATITIS C ANTIBODY

## 2016-08-24 ENCOUNTER — Ambulatory Visit: Payer: Self-pay | Attending: Internal Medicine

## 2016-08-27 ENCOUNTER — Telehealth: Payer: Self-pay | Admitting: Family Medicine

## 2016-08-27 NOTE — Telephone Encounter (Signed)
Needs dental referral for orange card, pt has infected tooth and it needs to be removed. Pt would like this done as soon as possible. Please advise. Thanks! ep

## 2016-08-30 ENCOUNTER — Ambulatory Visit (INDEPENDENT_AMBULATORY_CARE_PROVIDER_SITE_OTHER): Payer: Self-pay | Admitting: Family Medicine

## 2016-08-30 ENCOUNTER — Encounter: Payer: Self-pay | Admitting: Family Medicine

## 2016-08-30 VITALS — BP 128/92 | HR 80 | Temp 97.8°F | Ht 65.0 in | Wt 157.8 lb

## 2016-08-30 DIAGNOSIS — K029 Dental caries, unspecified: Secondary | ICD-10-CM

## 2016-08-30 MED ORDER — PENICILLIN V POTASSIUM 250 MG PO TABS: 250.0000 mg | ORAL_TABLET | Freq: Four times a day (QID) | ORAL | 0 refills | Status: AC

## 2016-08-30 NOTE — Patient Instructions (Addendum)
It was a pleasure to meet you today. Please see below to review our plan for today's visit.  1. I will put in a dental referral for your tooth pain. Please understand it will be some time before you get an appointment but this referral is the first step. 2. I will prescribe you penicillin for you pain given your tooth decay and bacterial infection is likely. Take the penicillin 4 times per day for 2 weeks. 3. You can take tylenol or ibuprofen for pain. Use warm compress as needed. 4. Glad the hydroxyzine is working. Keep your appointments with [facility]. 5. Please return in 3 months.  Please call the clinic at [phone] if your symptoms worsen or you have any concerns. It was my pleasure to see you. -- Harriet Butte, [place], PGY-1   Dental Pain Dental pain may be caused by many things, including:  Tooth decay (cavities or caries). Cavities expose the nerve of your tooth to air and hot or cold temperatures. This can cause pain or discomfort.  Abscess or infection. A dental abscess is a collection of infected pus from a bacterial infection in the inner part of the tooth (pulp). It usually occurs at the end of the tooth's root.  Injury.  An unknown reason (idiopathic). Your pain may be mild or severe. It may only occur when:  You are chewing.  You are exposed to hot or cold temperature.  You are eating or drinking sugary foods or beverages, such as soda or candy. Your pain may also be constant. HOME CARE INSTRUCTIONS Watch your dental pain for any changes. The following actions may help to lessen any discomfort that you are feeling:  Take medicines only as directed by your dentist.  If you were prescribed an antibiotic medicine, finish all of it even if you start to feel better.  Keep all follow-up visits as directed by your dentist. This is important.  Do not apply heat to the outside of your face.  Rinse your mouth or gargle with salt water if  directed by your dentist. This helps with pain and swelling.  You can make salt water by adding  tsp of salt to 1 cup of warm water.  Apply ice to the painful area of your face:  Put ice in a plastic bag.  Place a towel between your skin and the bag.  Leave the ice on for 20 minutes, 2-3 times per day.  Avoid foods or drinks that cause you pain, such as:  Very hot or very cold foods or drinks.  Sweet or sugary foods or drinks. SEEK MEDICAL CARE IF:  Your pain is not controlled with medicines.  Your symptoms are worse.  You have new symptoms. SEEK IMMEDIATE MEDICAL CARE IF:  You are unable to open your mouth.  You are having trouble breathing or swallowing.  You have a fever.  Your face, neck, or jaw is swollen.   This information is not intended to replace advice given to you by your health care provider. Make sure you discuss any questions you have with your health care provider.   Document Released: 10/08/2005 Document Revised: 02/22/2015 Document Reviewed: 10/04/2014 Elsevier Interactive Patient Education [employer].

## 2016-08-30 NOTE — Progress Notes (Signed)
   Subjective:   Patient ID: Kristina Moreno    DOB: Feb 26, 1966, 50 y.o. female   MRN: [id number]  CC: Tooth pain  HPI: Kristina Moreno is a 50 y.o. female who presents to clinic today tooth pain. Problems discussed today are as follows:  Tooth Pain: Onset 1 month ago after patient was flossing and felt her tooth break and had sharp pain radiating from LL jaw radiating to midline of mandible. Was seen at urgent care 08/03/16 and d/c with tramadol w/o releif. Pain is 10/10 in severity today. Patient recently received orange card and is hoping to get dental referral. Denies jaw claudication, fevers, edema, dysphagia, change in vision, headache.  ROS: Complete ROS performed, see HPI for pertinent ROS.  [place]: Pertinent past medical, surgical, family, and social history were reviewed and updated as appropriate. Smoking status reviewed.  Medications reviewed. Current Outpatient Prescriptions  Medication Sig Dispense Refill  . albuterol (PROAIR HFA) 108 (90 Base) MCG/ACT inhaler INHALE 2 PUFFS INTO THE LUNGS EVERY 6 HOURS AS NEEDED FOR SHORTNESS OF BREATH/ASTHMA 8.5 g 5  . hydrochlorothiazide (MICROZIDE) 12.5 MG capsule TAKE 1 CAPSULE BY MOUTH DAILY 360 capsule 0  . ibuprofen (ADVIL,MOTRIN) 200 MG tablet Take 800 mg by mouth 2 (two) times daily.    . mometasone-formoterol (DULERA) 200-5 MCG/ACT AERO Inhale 2 puffs into the lungs 2 (two) times daily. 13 g 11  . penicillin v potassium (VEETID) 250 MG tablet Take 1 tablet (250 mg total) by mouth 4 (four) times daily. 56 tablet 0   No current facility-administered medications for this visit.     Objective:   BP (!) 128/92   Pulse 80   Temp 97.8 F (36.6 C) (Oral)   Ht 5\' 5"  (1.651 m)   Wt 157 lb 12.8 oz (71.6 kg)   BMI 26.26 kg/m  Vitals and nursing note reviewed.  General: well nourished, well developed, in no acute distress with non-toxic appearance HEENT: normocephalic, atraumatic, moist mucous membranes, pharynx pink, many dental  carries throughout with redness to gingiva near LL tooth with cracks on surface, no abscess or loose tooth noted Neck: supple, non-tender without lymphadenopathy CV: regular rate and rhythm without murmurs, rubs, or gallops, no lower extremity edema Lungs: clear to auscultation bilaterally with normal work of breathing Extremities: warm and well perfused, normal tone  Assessment & Plan:   Pain due to dental caries Obvious dental and gum disease. Patient has objective pain with broken tooth with pain concerning for irritation of mandibular nerve. Pain not well controlled. No signs of abscess formation appreciated. --Dental referral placed --PCN V K 250 QID for x2 weeks --Tylenol and warm compress PRN --F/u in 3 months  Orders Placed This Encounter  Procedures  . Ambulatory referral to Dentistry    Referral Priority:   Routine    Referral Type:   Consultation    Referral Reason:   Specialty Services Required    Requested Specialty:   Dental General Practice    Number of Visits Requested:   1   Meds ordered this encounter  Medications  . penicillin v potassium (VEETID) 250 MG tablet    Sig: Take 1 tablet (250 mg total) by mouth 4 (four) times daily.    Dispense:  56 tablet    Refill:  0    Harriet Butte, DO [place], PGY-1 08/30/2016 5:25 PM

## 2016-08-30 NOTE — Assessment & Plan Note (Addendum)
Obvious dental and gum disease. Patient has objective pain with broken tooth with pain concerning for irritation of mandibular nerve. Pain not well controlled. No signs of abscess formation appreciated. --Dental referral placed --PCN V K 250 QID for x2 weeks --Tylenol and warm compress PRN --F/u in 3 months

## 2016-09-24 ENCOUNTER — Telehealth: Payer: Self-pay | Admitting: Family Medicine

## 2016-09-24 NOTE — Telephone Encounter (Signed)
Originally called to ask about her son's NS and she mentioned she wanted to schedule an appt for foot injury 3 mons ago. Scheduled appt 10/31/16 @1 :30pm

## 2016-10-31 ENCOUNTER — Encounter: Payer: Self-pay | Admitting: Family Medicine

## 2016-10-31 ENCOUNTER — Ambulatory Visit (INDEPENDENT_AMBULATORY_CARE_PROVIDER_SITE_OTHER): Payer: Self-pay | Admitting: Family Medicine

## 2016-10-31 VITALS — BP 120/80 | HR 71 | Temp 98.4°F | Ht 65.0 in | Wt 164.8 lb

## 2016-10-31 DIAGNOSIS — K644 Residual hemorrhoidal skin tags: Secondary | ICD-10-CM | POA: Insufficient documentation

## 2016-10-31 DIAGNOSIS — B070 Plantar wart: Secondary | ICD-10-CM

## 2016-10-31 NOTE — Assessment & Plan Note (Addendum)
Acute. Solitary lesion with wart-like appearance. Does not appear tender on exam and non infectious.  --Use comfortable soles and shoes --Avoid picking or removing callus as this could cause infection --Instructed to try wart removal OTC --If persists, will refer to podiatry

## 2016-10-31 NOTE — Progress Notes (Signed)
   Subjective:   Patient ID: Kristina Moreno    DOB: 1966/05/09, 51 y.o. female   MRN: [id number]  CC: "sore foot after I stepped on a hair pin"  HPI: Kristina Moreno is a 51 y.o. female who presents to clinic today for concerns of left foot pain and hemorrhoids. Problems discussed today are as follows:  Sore foot: onset 3 months ago. Patient stepped on a hair pin and felt a pain in her foot. Says there was no bleeding, erythema, or swelling at the site. Denies h/o fevers or chills. Has experienced difficulty walking due to pain. States the pain "feels tender to touch," and "looks like a keloid."  Hemorrhoids: has experienced hemorrhoids for years but has noticed blood in stool a few weeks ago which has resolved. She says theit was painless rectal bleeding but does frequently have painful hemorrhoids. Uses milk of magnesia twice daily due to feeling"stopped up." Denies SOB, lightheadedness, or fatigue.  ROS: complete ROS performed, see HPI for pertinent ROS.  [place]: COPD, vertigo, HLD, type 1 bipolar disorder, chronic back pain with sciatica. Smoking status reviewed. Medications reviewed.  Objective:   BP 120/80 (BP Location: Left Arm, Patient Position: Sitting, Cuff Size: Normal)   Pulse 71   Temp 98.4 F (36.9 C) (Oral)   Ht 5\' 5"  (1.651 m)   Wt 164 lb 12.8 oz (74.8 kg)   SpO2 99%   BMI 27.42 kg/m  Vitals and nursing note reviewed.  General: well nourished, well developed, in no acute distress with non-toxic appearance HEENT: normocephalic, atraumatic, moist mucous membranes CV: regular rate and rhythm without murmurs, rubs, or gallops, no lower extremity edema Lungs: clear to auscultation bilaterally with normal work of breathing Abdomen: soft, non-tender, non-distended, no masses or organomegaly palpable, normoactive bowel sounds Skin: warm, dry, no rashes or lesions, cap refill < 2 seconds Extremities: warm and well perfused, normal tone Foot: pedal pulse 2+ b/l, left foot  with callused firm lesion measuring 0.5 cm in diameter without bleeding or discharge, no decreased sensation and normal motor function of feet   Assessment & Plan:   Plantar wart, left foot Acute. Solitary lesion with wart-like appearance. Does not appear tender on exam and non infectious.  --Use comfortable soles and shoes --Avoid picking or removing callus as this could cause infection --Instructed to try wart removal OTC --If persists, will refer to podiatry  External hemorrhoids without complication Chronic. Ongoing for years with intermittent episodes. Has bleeding with internal hemorrhoids which has resolved. Seen by GI for colonoscopy 03/2016 showing internal hemorrhoids. Patient seeks banding for relief. --Given handout and instructed on good bowel habits --Encouraged to exercise, avoid sitting for prolonged periods of time, and to stay hydrated --Continue preparation-H OTC if pain persists, and use MOM only as needed --Given red flag si/sx which she should seek medical attention including: excessive bleeding, fatigue, SOB, and lightheadedness --GI referral for consideration of hemorrhoid banding  Orders Placed This Encounter  Procedures  . Ambulatory referral to Gastroenterology    Referral Priority:   Routine    Referral Type:   Consultation    Referral Reason:   Specialty Services Required    Number of Visits Requested:   1   No orders of the defined types were placed in this encounter.   Harriet Butte, [place], PGY-1 10/31/2016 8:01 PM

## 2016-10-31 NOTE — Patient Instructions (Addendum)
It was a pleasure to meet you today. Please see below to review our plan for today's visit.   1. You have a plantar wart on your left foot. Try using OTC wart removal and avoid removing the callus due to risk for infection. Using soft soles for your shoes will help with the pain and reduce your sciatic pain. If this continues to be problematic I can send you to a podiatrist. 2. For hemorrhoids, try using preparation-H OTC for pain. Bleeding is common and practicing safe bowel habit is important. Below is an attachment on what to do. Stay hydrated and avoid using the toilet for extended periods of time while training. If you become symptomatic meaning feel fatigues or short of breath, please return to the clinic. I will refer you to your GI doctor for banding of the hemorrhoids. 3. I will see you in 3 months.  Please call the clinic at [phone] if your symptoms worsen or you have any concerns. It was my pleasure to see you. -- Harriet Butte, [place], PGY-1   Hemorrhoids Hemorrhoids are swollen veins in and around the rectum or anus. There are two types of hemorrhoids:  Internal hemorrhoids. These occur in the veins that are just inside the rectum. They may poke through to the outside and become irritated and painful.  External hemorrhoids. These occur in the veins that are outside of the anus and can be felt as a painful swelling or hard lump near the anus. Most hemorrhoids do not cause serious problems, and they can be managed with home treatments such as diet and lifestyle changes. If home treatments do not help your symptoms, procedures can be done to shrink or remove the hemorrhoids. What are the causes? This condition is caused by increased pressure in the anal area. This pressure may result from various things, including:  Constipation.  Straining to have a bowel movement.  Diarrhea.  Pregnancy.  Obesity.  Sitting for long periods of time.  Heavy  lifting or other activity that causes you to strain.  Anal sex. What are the signs or symptoms? Symptoms of this condition include:  Pain.  Anal itching or irritation.  Rectal bleeding.  Leakage of stool (feces).  Anal swelling.  One or more lumps around the anus. How is this diagnosed? This condition can often be diagnosed through a visual exam. Other exams or tests may also be done, such as:  Examination of the rectal area with a gloved hand (digital rectal exam).  Examination of the anal canal using a small tube (anoscope).  A blood test, if you have lost a significant amount of blood.  A test to look inside the colon (sigmoidoscopy or colonoscopy). How is this treated? This condition can usually be treated at home. However, various procedures may be done if dietary changes, lifestyle changes, and other home treatments do not help your symptoms. These procedures can help make the hemorrhoids smaller or remove them completely. Some of these procedures involve surgery, and others do not. Common procedures include:  Rubber band ligation. Rubber bands are placed at the base of the hemorrhoids to cut off the blood supply to them.  Sclerotherapy. Medicine is injected into the hemorrhoids to shrink them.  Infrared coagulation. A type of light energy is used to get rid of the hemorrhoids.  Hemorrhoidectomy surgery. The hemorrhoids are surgically removed, and the veins that supply them are tied off.  Stapled hemorrhoidopexy surgery. A circular stapling device is used  to remove the hemorrhoids and use staples to cut off the blood supply to them. Follow these instructions at home: Eating and drinking  Eat foods that have a lot of fiber in them, such as whole grains, beans, nuts, fruits, and vegetables. Ask your health care provider about taking products that have added fiber (fiber supplements).  Drink enough fluid to keep your urine clear or pale yellow. Managing pain and  swelling  Take warm sitz baths for 20 minutes, 3-4 times a day to ease pain and discomfort.  If directed, apply ice to the affected area. Using ice packs between sitz baths may be helpful.  Put ice in a plastic bag.  Place a towel between your skin and the bag.  Leave the ice on for 20 minutes, 2-3 times a day. General instructions  Take over-the-counter and prescription medicines only as told by your health care provider.  Use medicated creams or suppositories as told.  Exercise regularly.  Go to the bathroom when you have the urge to have a bowel movement. Do not wait.  Avoid straining to have bowel movements.  Keep the anal area dry and clean. Use wet toilet paper or moist towelettes after a bowel movement.  Do not sit on the toilet for long periods of time. This increases blood pooling and pain. Contact a health care provider if:  You have increasing pain and swelling that are not controlled by treatment or medicine.  You have uncontrolled bleeding.  You have difficulty having a bowel movement, or you are unable to have a bowel movement.  You have pain or inflammation outside the area of the hemorrhoids. This information is not intended to replace advice given to you by your health care provider. Make sure you discuss any questions you have with your health care provider. Document Released: 10/05/2000 Document Revised: 03/07/2016 Document Reviewed: 06/22/2015 Elsevier Interactive Patient Education  2017 [employer].

## 2016-10-31 NOTE — Assessment & Plan Note (Addendum)
Chronic. Ongoing for years with intermittent episodes. Has bleeding with internal hemorrhoids which has resolved. Seen by GI for colonoscopy 03/2016 showing internal hemorrhoids. Patient seeks banding for relief. --Given handout and instructed on good bowel habits --Encouraged to exercise, avoid sitting for prolonged periods of time, and to stay hydrated --Continue preparation-H OTC if pain persists, and use MOM only as needed --Given red flag si/sx which she should seek medical attention including: excessive bleeding, fatigue, SOB, and lightheadedness --GI referral for consideration of hemorrhoid banding

## 2016-12-13 ENCOUNTER — Telehealth: Payer: Self-pay | Admitting: Gastroenterology

## 2016-12-13 ENCOUNTER — Encounter: Payer: Self-pay | Admitting: Gastroenterology

## 2016-12-13 ENCOUNTER — Ambulatory Visit (INDEPENDENT_AMBULATORY_CARE_PROVIDER_SITE_OTHER): Payer: Self-pay | Admitting: Gastroenterology

## 2016-12-13 VITALS — BP 106/76 | HR 88 | Ht 65.0 in | Wt 160.2 lb

## 2016-12-13 DIAGNOSIS — K625 Hemorrhage of anus and rectum: Secondary | ICD-10-CM

## 2016-12-13 DIAGNOSIS — K649 Unspecified hemorrhoids: Secondary | ICD-10-CM

## 2016-12-13 DIAGNOSIS — K641 Second degree hemorrhoids: Secondary | ICD-10-CM

## 2016-12-13 DIAGNOSIS — K5900 Constipation, unspecified: Secondary | ICD-10-CM

## 2016-12-13 MED ORDER — LINACLOTIDE 72 MCG PO CAPS: 72.0000 ug | ORAL_CAPSULE | Freq: Every day | ORAL | 3 refills | Status: DC

## 2016-12-13 NOTE — Progress Notes (Signed)
Kristina Moreno    ML:[id number]    1965/12/26  Primary Care Physician:David Reather Laurence, DO  Referring Physician:  Bing, DO [address] [place], [place] [address]  Chief complaint:  Blood per rectum, hemorrhoids  HPI: 39 yr F here for follow up viist with c/o intermittent bright red blood per rectum mostly when she wipes, last week became worse noticed sligntly more bleeding with drops of blood in toilet bowl. She tried Preparation H with no improvement. Burning sensatation improved but did not help with tissue protrusion or bleeding. She is constipated and is taking Magnesium once or twice weekly with daily bowel movements     Outpatient Encounter Prescriptions as of 12/13/2016  Medication Sig  . albuterol (PROAIR HFA) 108 (90 Base) MCG/ACT inhaler INHALE 2 PUFFS INTO THE LUNGS EVERY 6 HOURS AS NEEDED FOR SHORTNESS OF BREATH/ASTHMA  . hydrochlorothiazide (MICROZIDE) 12.5 MG capsule TAKE 1 CAPSULE BY MOUTH DAILY  . hydrOXYzine (ATARAX/VISTARIL) 10 MG tablet Take 10 mg by mouth 3 (three) times daily as needed for anxiety.  Marland Kitchen ibuprofen (ADVIL,MOTRIN) 200 MG tablet Take 800 mg by mouth 2 (two) times daily.  . mometasone-formoterol (DULERA) 200-5 MCG/ACT AERO Inhale 2 puffs into the lungs 2 (two) times daily.   No facility-administered encounter medications on file as of 12/13/2016.     Allergies as of 12/13/2016  . (No Known Allergies)    Past Medical History:  Diagnosis Date  . Anxiety   . Asthma   . Contact dermatitis   . COPD (chronic obstructive pulmonary disease) ([name])   . Meniere disease   . Sciatica     Past Surgical History:  Procedure Laterality Date  . TOTAL ABDOMINAL HYSTERECTOMY  04/03/2006   Pathology revealed benign uterus and cervix    Family History  Problem Relation Age of Onset  . Leukemia Father   . Colon polyps Paternal Grandfather   . Anesthesia problems Neg Hx   . Hypotension Neg Hx   . Malignant hyperthermia Neg Hx   .  Pseudochol deficiency Neg Hx   . Colon cancer Neg Hx   . Esophageal cancer Neg Hx   . Rectal cancer Neg Hx   . Stomach cancer Neg Hx     Social History   Social History  . Marital status: Single    Spouse name: N/A  . Number of children: 2  . Years of education: N/A   Occupational History  . cashier    Social History Main Topics  . Smoking status: Former Smoker    Quit date: 12/14/2007  . Smokeless tobacco: Never Used  . Alcohol use 0.0 oz/week     Comment: occasionally  . Drug use: Yes    Frequency: 7.0 times per week    Types: Marijuana     Comment: patient states that she uses marijuana everyday.  Last smoked this am 0900 am. maw  . Sexual activity: Yes    Birth control/ protection: Surgical   Other Topics Concern  . Not on file   Social History Narrative  . No narrative on file      Review of systems: Review of Systems  Constitutional: Negative for fever and chills.  HENT: Negative.   Eyes: Negative for blurred vision.  Respiratory: Negative for cough, shortness of breath and wheezing.   Cardiovascular: Negative for chest pain and palpitations.  Gastrointestinal: as per HPI Genitourinary: Negative for dysuria, urgency, frequency and hematuria.  Musculoskeletal: Negative for myalgias, back pain and joint pain.  Skin: Negative for itching and rash.  Neurological: Negative for dizziness, tremors, focal weakness, seizures and loss of consciousness.  Endo/Heme/Allergies: Positive for seasonal allergies.  Psychiatric/Behavioral: Negative for depression, suicidal ideas and hallucinations.  All other systems reviewed and are negative.   Physical Exam: Vitals:   12/13/16 0837  BP: 106/76  Pulse: 88   Body mass index is 26.67 kg/m. Gen:      No acute distress HEENT:  EOMI, sclera anicteric Neck:     No masses; no thyromegaly Lungs:    Clear to auscultation bilaterally; normal respiratory effort CV:         Regular rate and rhythm; no murmurs Abd:      +  bowel sounds; soft, non-tender; no palpable masses, no distension Ext:    No edema; adequate peripheral perfusion Skin:      Warm and dry; no rash Neuro: alert and oriented x 3 Psych: normal mood and affect Rectal exam: Normal anal sphincter tone, no anal fissure or external hemorrhoids Anoscopy: Small internal hemorrhoids, no active bleeding, normal dentate line, no visible nodules  Data Reviewed:  Reviewed labs, radiology imaging, old records and pertinent past GI work up   Assessment and Plan/Recommendations: 15 yr F presents with symptomatic grade II  hemorrhoids, requesting rubber band ligation of his/her hemorrhoidal disease.  All risks, benefits and alternative forms of therapy were described and informed consent was obtained.   The anorectum was pre-medicated with 0.125% Nitroglycerine and Recticare The decision was made to band the Right posterior internal hemorrhoid, and the [place] was used to perform band ligation without complication.  Digital anorectal examination was then performed to assure proper positioning of the band, and to adjust the banded tissue as required.  The patient was discharged home without pain or other issues.  Dietary and behavioral recommendations were given and along with follow-up instructions.     The following adjunctive treatments were recommended: Linzess 72 mcg capsule daily  The patient will return in 2-4weeks for  follow-up and possible additional banding as required. No complications were encountered and the patient tolerated the procedure well.   Greater than 50% of the time used for counseling as well as treatment plan and follow-up. She had multiple questions which were answered to her satisfaction  K. Denzil Magnuson , MD [phone] Mon-Fri 8a-5p [phone] after 5p, weekends, holidays  CC: [place] Bing, DO

## 2016-12-13 NOTE — Telephone Encounter (Signed)
Dr Silverio Decamp, Patient has Verna Czech card. She has no prescription coverage  Linzess is $400

## 2016-12-13 NOTE — Patient Instructions (Signed)

## 2016-12-14 NOTE — Telephone Encounter (Signed)
Please advise patient to take Miralax 1 capful daily

## 2016-12-14 NOTE — Telephone Encounter (Signed)
Called patient to inform Dr Jillyn Hidden recommendations for Miralax daily, She will try that and contact us if no better

## 2016-12-24 ENCOUNTER — Ambulatory Visit (INDEPENDENT_AMBULATORY_CARE_PROVIDER_SITE_OTHER): Payer: Self-pay | Admitting: Family Medicine

## 2016-12-24 DIAGNOSIS — M25562 Pain in left knee: Secondary | ICD-10-CM | POA: Insufficient documentation

## 2016-12-24 DIAGNOSIS — M25561 Pain in right knee: Secondary | ICD-10-CM | POA: Insufficient documentation

## 2016-12-24 NOTE — Progress Notes (Signed)
   Subjective:   Kristina Moreno is a 51 y.o. female with a history of Anxiety, COPD, bipolar 1 disorder here for same day appointment for bilateral knee pain  Patient reports bilateral knee pain intermittently 1 month. It is worse after standing at her new job for 8 hours a day for the last 4 days. She has noticed swelling around both knees and a click in her left knee as she walks. She has not tried any medications. She wants to wear braces. She thinks she might have osteoarthritis. She reports that she regularly goes out dancing and overdoes it. Pain is better with ice and rest. Worse with standing in place.  No instability. No fevers, rash, weakness, numbness, trauma.  Review of Systems:  Per HPI.   Social History: former smoker  Objective:  BP 110/60 (BP Location: Left Arm, Patient Position: Sitting, Cuff Size: Normal)   Pulse 62   Temp 97.9 F (36.6 C) (Oral)   Ht 5\' 5"  (1.651 m)   Wt 160 lb 9.6 oz (72.8 kg)   SpO2 99%   BMI 26.73 kg/m   Gen:  51 y.o. female in NAD HEENT: NCAT, MMM, anicteric sclerae Ext: WWP, no edema MSK: b/l Knees: Normal to inspection with no erythema or effusion or obvious bony abnormalities. Palpation normal with no warmth or joint line tenderness or patellar tenderness or condyle tenderness. Palpable catching with flexion of L knee ROM normal in flexion and extension and lower leg rotation. Ligaments with solid consistent endpoints including ACL, PCL, LCL, MCL. Negative Mcmurray's and provocative meniscal tests. Non painful patellar compression. Patellar and quadriceps tendons unremarkable. Hamstring and quadriceps strength is normal. Neuro: Alert and oriented, speech normal     Assessment & Plan:     Kristina Moreno is a 50 y.o. female here for   Bilateral knee pain Likely related to knee OA with possible degenerative meniscal changes in left knee causing catching sensation No evidence of instability on exam Advised rest, ice,  anti-inflammatories Advised using a knee sleeve for support while at work as well as supportive shoes Follow-up in one month - if not improving, consider standing bilateral knee x-rays to evaluate degree of OA and possible corticosteroid injection for symptom management   Virginia Crews, MD MPH PGY-3,  [facility] 12/24/2016  2:52 PM

## 2016-12-24 NOTE — Patient Instructions (Signed)
Nice to see you today. Your knee pain is likely related to some osteoarthritis. We are comfortable and padded shoes when your standing on her feet a lot at work. Take ibuprofen 600 mg 3 times a day regularly for the next 1-2 weeks to help with the inflammation and swelling. Take this with food so as to not hurt the lining of your stomach. Avoid deep squats, but to stay active. Ice and elevate when home. You can get knee sleeves for comfort and stability when standing in place   If it is not getting better, follow-up in 4 weeks.  Take care, Dr. Jacinto Reap

## 2016-12-24 NOTE — Assessment & Plan Note (Signed)
Likely related to knee OA with possible degenerative meniscal changes in left knee causing catching sensation No evidence of instability on exam Advised rest, ice, anti-inflammatories Advised using a knee sleeve for support while at work as well as supportive shoes Follow-up in one month - if not improving, consider standing bilateral knee x-rays to evaluate degree of OA and possible corticosteroid injection for symptom management

## 2017-01-11 ENCOUNTER — Ambulatory Visit: Payer: Self-pay | Admitting: Family Medicine

## 2017-01-23 ENCOUNTER — Ambulatory Visit: Payer: Self-pay | Admitting: Family Medicine

## 2017-01-31 ENCOUNTER — Ambulatory Visit (INDEPENDENT_AMBULATORY_CARE_PROVIDER_SITE_OTHER): Payer: Self-pay | Admitting: Gastroenterology

## 2017-01-31 ENCOUNTER — Encounter: Payer: Self-pay | Admitting: Gastroenterology

## 2017-01-31 ENCOUNTER — Encounter (INDEPENDENT_AMBULATORY_CARE_PROVIDER_SITE_OTHER): Payer: Self-pay

## 2017-01-31 VITALS — BP 104/62 | HR 80 | Ht 65.0 in | Wt 161.6 lb

## 2017-01-31 DIAGNOSIS — K642 Third degree hemorrhoids: Secondary | ICD-10-CM

## 2017-01-31 NOTE — Progress Notes (Signed)
PROCEDURE NOTE: The patient presents with symptomatic grade III  hemorrhoids, requesting rubber band ligation of his/her hemorrhoidal disease.  All risks, benefits and alternative forms of therapy were described and informed consent was obtained.   The anorectum was pre-medicated with 0.125% Nitroglycerine and Recticare The decision was made to band the Right anterior  internal hemorrhoid, and the [place] was used to perform band ligation without complication.  Digital anorectal examination was then performed to assure proper positioning of the band, and to adjust the banded tissue as required.  The patient was discharged home without pain or other issues.  Dietary and behavioral recommendations were given and along with follow-up instructions.      The patient will return in 2-4 weeks for  follow-up and possible additional banding as required. No complications were encountered and the patient tolerated the procedure well.  Damaris Hippo , MD [phone] Mon-Fri 8a-5p [phone] after 5p, weekends, holidays

## 2017-02-07 NOTE — Progress Notes (Deleted)
   Subjective:   Patient ID: [name]    DOB: [birth year], 51 y.o. female   MRN: [id number]  CC: "***"  HPI: Samantha Patrecia Pace is a 51 y.o. female who presents to clinic today ***. Problems discussed today are as follows:  ***: ***  ROS: complete ROS performed, see HPI for pertinent ROS.  [place]: COPD, vertigo, HLD, type 1 bipolar disorder, chronic back pain with sciatica. Smoking status reviewed. Medications reviewed.  Objective:   There were no vitals taken for this visit. Vitals and nursing note reviewed.  General: well nourished, well developed, in no acute distress with non-toxic appearance HEENT: normocephalic, atraumatic, moist mucous membranes Neck: supple, non-tender without lymphadenopathy CV: regular rate and rhythm without murmurs, rubs, or gallops, no lower extremity edema Lungs: clear to auscultation bilaterally with normal work of breathing Abdomen: soft, non-tender, non-distended, no masses or organomegaly palpable, normoactive bowel sounds Skin: warm, dry, no rashes or lesions, cap refill < 2 seconds Extremities: warm and well perfused, normal tone  Assessment & Plan:   No problem-specific Assessment & Plan notes found for this encounter.  No orders of the defined types were placed in this encounter.  No orders of the defined types were placed in this encounter.   Harriet Butte, [place], PGY-1 02/07/2017 8:51 PM

## 2017-02-08 ENCOUNTER — Ambulatory Visit: Payer: Self-pay | Admitting: Family Medicine

## 2017-02-11 ENCOUNTER — Encounter: Payer: Self-pay | Admitting: Gastroenterology

## 2017-02-11 ENCOUNTER — Ambulatory Visit (INDEPENDENT_AMBULATORY_CARE_PROVIDER_SITE_OTHER): Payer: Self-pay | Admitting: Gastroenterology

## 2017-02-11 VITALS — BP 100/70 | HR 72 | Ht 65.0 in | Wt 161.5 lb

## 2017-02-11 DIAGNOSIS — K641 Second degree hemorrhoids: Secondary | ICD-10-CM

## 2017-02-11 NOTE — Progress Notes (Signed)
PROCEDURE NOTE: The patient presents with symptomatic grade II  hemorrhoids, requesting rubber band ligation of his/her hemorrhoidal disease.  All risks, benefits and alternative forms of therapy were described and informed consent was obtained.   The anorectum was pre-medicated with 0.125% and Recticare The decision was made to band the Left lateral internal hemorrhoid, and the [place] was used to perform band ligation without complication.  Digital anorectal examination was then performed to assure proper positioning of the band, and to adjust the banded tissue as required.  The patient was discharged home without pain or other issues.  Dietary and behavioral recommendations were given and along with follow-up instructions.     The patient will return for  follow-up and possible additional banding as required. No complications were encountered and the patient tolerated the procedure well.  Damaris Hippo , MD [phone] Mon-Fri 8a-5p [phone] after 5p, weekends, holidays

## 2017-02-11 NOTE — Patient Instructions (Signed)

## 2017-02-18 ENCOUNTER — Ambulatory Visit: Payer: Self-pay

## 2017-03-19 NOTE — Progress Notes (Deleted)
   Subjective:   Patient ID: Kristina Moreno    DOB: Jan 02, 1966, 51 y.o. female   MRN: [id number]  CC: "***"  HPI: Kristina Moreno is a 51 y.o. female who presents to clinic today ***. Problems discussed today are as follows:  ***: *** ROS: ***  ***Seen by me 10/2016 for plantar wart and hemorrhoids. Had banding 01/2017. Seen at [facility] for acute knee pain b/l. Advised to use knee sleeve, conservative measures, consider standing bilateral knee x-rays to evaluate degree of OA and possible corticosteroid injection for symptom management.  Complete ROS performed, see HPI for pertinent.  [place]: COPD, vertigo, HLD, type 1 bipolar disorder, chronic back pain with sciatica. Smoking status reviewed. Medications reviewed.  Objective:   There were no vitals taken for this visit. Vitals and nursing note reviewed.  General: well nourished, well developed, in no acute distress with non-toxic appearance HEENT: normocephalic, atraumatic, moist mucous membranes Neck: supple, non-tender without lymphadenopathy CV: regular rate and rhythm without murmurs, rubs, or gallops, no lower extremity edema Lungs: clear to auscultation bilaterally with normal work of breathing Abdomen: soft, non-tender, non-distended, no masses or organomegaly palpable, normoactive bowel sounds Skin: warm, dry, no rashes or lesions, cap refill < 2 seconds Extremities: warm and well perfused, normal tone  Assessment & Plan:   No problem-specific Assessment & Plan notes found for this encounter.  No orders of the defined types were placed in this encounter.  No orders of the defined types were placed in this encounter.   Harriet Butte, [place], PGY-1 03/19/2017 4:52 PM

## 2017-03-20 ENCOUNTER — Ambulatory Visit: Payer: Self-pay | Admitting: Family Medicine

## 2017-03-28 ENCOUNTER — Ambulatory Visit: Payer: Self-pay | Admitting: Family Medicine

## 2017-03-28 ENCOUNTER — Telehealth: Payer: Self-pay | Admitting: Family Medicine

## 2017-03-28 NOTE — Telephone Encounter (Signed)
Pt had to work overtime and missed her TOP C appointment today. Pt would like to reschedule. Pt will need a 3pm appointment time. ep

## 2017-03-29 NOTE — Telephone Encounter (Signed)
The only appointment I have is at 2:30 on 6/22. Please see if patient can make this appointment. If so, schedule please, otherwise she will have to schedule for a morning appointment. Thank you. -- Harriet Butte, [place], PGY-1

## 2017-04-02 NOTE — Telephone Encounter (Signed)
LMOVM fot pt to call back and make an appt. Please see message below. Deseree Kennon Holter, CMA

## 2017-05-31 ENCOUNTER — Encounter (HOSPITAL_COMMUNITY): Payer: Self-pay | Admitting: *Deleted

## 2017-05-31 ENCOUNTER — Inpatient Hospital Stay (HOSPITAL_COMMUNITY)
Admission: AD | Admit: 2017-05-31 | Discharge: 2017-05-31 | Disposition: A | Discharge status: Home / Self Care | Payer: Self-pay | Source: Ambulatory Visit | Attending: Family Medicine | Admitting: Family Medicine

## 2017-05-31 DIAGNOSIS — N898 Other specified noninflammatory disorders of vagina: Secondary | ICD-10-CM | POA: Insufficient documentation

## 2017-05-31 DIAGNOSIS — A599 Trichomoniasis, unspecified: Secondary | ICD-10-CM | POA: Insufficient documentation

## 2017-05-31 DIAGNOSIS — Z87891 Personal history of nicotine dependence: Secondary | ICD-10-CM | POA: Insufficient documentation

## 2017-05-31 DIAGNOSIS — Z79899 Other long term (current) drug therapy: Secondary | ICD-10-CM | POA: Insufficient documentation

## 2017-05-31 DIAGNOSIS — J449 Chronic obstructive pulmonary disease, unspecified: Secondary | ICD-10-CM | POA: Insufficient documentation

## 2017-05-31 DIAGNOSIS — F603 Borderline personality disorder: Secondary | ICD-10-CM | POA: Insufficient documentation

## 2017-05-31 DIAGNOSIS — F419 Anxiety disorder, unspecified: Secondary | ICD-10-CM | POA: Insufficient documentation

## 2017-05-31 HISTORY — DX: Benign neoplasm of connective and other soft tissue, unspecified: D21.9

## 2017-05-31 HISTORY — DX: Unspecified infectious disease: B99.9

## 2017-05-31 HISTORY — DX: Borderline personality disorder: F60.3

## 2017-05-31 LAB — URINALYSIS, ROUTINE W REFLEX MICROSCOPIC
BILIRUBIN URINE: NEGATIVE
Bacteria, UA: NONE SEEN
Glucose, UA: NEGATIVE mg/dL
Hgb urine dipstick: NEGATIVE
KETONES UR: NEGATIVE mg/dL
NITRITE: NEGATIVE
Protein, ur: NEGATIVE mg/dL
Specific Gravity, Urine: 1.026 (ref 1.005–1.030)
pH: 5 (ref 5.0–8.0)

## 2017-05-31 LAB — RPR: RPR Ser Ql: NONREACTIVE

## 2017-05-31 LAB — WET PREP, GENITAL
Sperm: NONE SEEN
YEAST WET PREP: NONE SEEN

## 2017-05-31 LAB — HIV ANTIBODY (ROUTINE TESTING W REFLEX): HIV Screen 4th Generation wRfx: NONREACTIVE

## 2017-05-31 MED ORDER — METRONIDAZOLE 500 MG PO TABS
2000.0000 mg | ORAL_TABLET | Freq: Once | ORAL | Status: AC
  Administered 2017-05-31: 2000 mg via ORAL
  Filled 2017-05-31: qty 4

## 2017-05-31 NOTE — MAU Note (Signed)
Thinks she has an infection. Has a d/c with a little odor, started on Monday.  Some itching

## 2017-05-31 NOTE — Discharge Instructions (Signed)

## 2017-05-31 NOTE — MAU Provider Note (Signed)
History     CSN: [id number]  Arrival date and time: 05/31/17 5809   First Provider Initiated Contact with Patient 05/31/17 [phone]      Chief Complaint  Patient presents with  . Vaginal Discharge   Vaginal Discharge  The patient's primary symptoms include a genital odor and vaginal discharge. The patient's pertinent negatives include no pelvic pain. This is a new problem. The current episode started in the past 7 days. The problem occurs constantly. The problem has been unchanged. The patient is experiencing no pain. She is not pregnant. Pertinent negatives include no chills, dysuria, fever, frequency, nausea, urgency or vomiting. The vaginal discharge was malodorous and clear. There has been no bleeding. Nothing aggravates the symptoms. She has tried nothing for the symptoms. She is sexually active. She uses condoms for contraception. Her past medical history is significant for a gynecological surgery.    Past Medical History:  Diagnosis Date  . Anxiety   . Asthma   . Borderline personality disorder   . Contact dermatitis   . COPD (chronic obstructive pulmonary disease) ([place])   . Fibroid   . Infection    UTI  . Meniere disease   . Sciatica     Past Surgical History:  Procedure Laterality Date  . ABDOMINAL HYSTERECTOMY    . transvaginal hysterectomy  04/03/2006   Pathology revealed benign uterus and cervix    Family History  Problem Relation Age of Onset  . Leukemia Father   . COPD Mother   . Colon polyps Paternal Grandfather   . Anesthesia problems Neg Hx   . Hypotension Neg Hx   . Malignant hyperthermia Neg Hx   . Pseudochol deficiency Neg Hx   . Colon cancer Neg Hx   . Esophageal cancer Neg Hx   . Rectal cancer Neg Hx   . Stomach cancer Neg Hx     Social History  Substance Use Topics  . Smoking status: Former Smoker    Quit date: 12/14/2007  . Smokeless tobacco: Never Used  . Alcohol use 0.0 oz/week     Comment: occasionally    Allergies: No Known  Allergies  Prescriptions Prior to Admission  Medication Sig Dispense Refill Last Dose  . albuterol (PROAIR HFA) 108 (90 Base) MCG/ACT inhaler INHALE 2 PUFFS INTO THE LUNGS EVERY 6 HOURS AS NEEDED FOR SHORTNESS OF BREATH/ASTHMA 8.5 g 5 Taking  . hydrochlorothiazide (MICROZIDE) 12.5 MG capsule TAKE 1 CAPSULE BY MOUTH DAILY 360 capsule 0 Taking  . hydrOXYzine (ATARAX/VISTARIL) 10 MG tablet Take 10 mg by mouth 3 (three) times daily as needed for anxiety.   Taking  . ibuprofen (ADVIL,MOTRIN) 200 MG tablet Take 800 mg by mouth 2 (two) times daily.   Taking  . mometasone-formoterol (DULERA) 200-5 MCG/ACT AERO Inhale 2 puffs into the lungs 2 (two) times daily. 13 g 11 Taking    Review of Systems  Constitutional: Negative for chills and fever.  Gastrointestinal: Negative for nausea and vomiting.  Genitourinary: Positive for vaginal discharge. Negative for dysuria, frequency, pelvic pain and urgency.   Physical Exam   Blood pressure 109/66, pulse 73, temperature 98.3 F (36.8 C), temperature source Oral, resp. rate 16.  Physical Exam  Nursing note and vitals reviewed. Constitutional: She is oriented to person, place, and time. She appears well-developed and well-nourished. No distress.  HENT:  Head: Normocephalic.  Cardiovascular: Normal rate.   Respiratory: Effort normal.  GI: Soft. There is no tenderness. There is no rebound.  Genitourinary:  Genitourinary Comments:  External: no lesion Vagina: small amount of white discharge Cervix: SA Uterus: SA Adnexa: NT   Neurological: She is alert and oriented to person, place, and time.  Skin: Skin is warm and dry.  Psychiatric: She has a normal mood and affect.   Results for orders placed or performed during the hospital encounter of 05/31/17 (from the past 24 hour(s))  Urinalysis, Routine w reflex microscopic     Status: Abnormal   Collection Time: 05/31/17  7:45 AM  Result Value Ref Range   Color, Urine YELLOW YELLOW   APPearance CLEAR  CLEAR   Specific Gravity, Urine 1.026 1.005 - 1.030   pH 5.0 5.0 - 8.0   Glucose, UA NEGATIVE NEGATIVE mg/dL   Hgb urine dipstick NEGATIVE NEGATIVE   Bilirubin Urine NEGATIVE NEGATIVE   Ketones, ur NEGATIVE NEGATIVE mg/dL   Protein, ur NEGATIVE NEGATIVE mg/dL   Nitrite NEGATIVE NEGATIVE   Leukocytes, UA SMALL (A) NEGATIVE   RBC / HPF 0-5 0 - 5 RBC/hpf   WBC, UA 6-30 0 - 5 WBC/hpf   Bacteria, UA NONE SEEN NONE SEEN   Squamous Epithelial / LPF 0-5 (A) NONE SEEN   Mucous PRESENT   Wet prep, genital     Status: Abnormal   Collection Time: 05/31/17  8:22 AM  Result Value Ref Range   Yeast Wet Prep HPF POC NONE SEEN NONE SEEN   Trich, Wet Prep PRESENT (A) NONE SEEN   Clue Cells Wet Prep HPF POC PRESENT (A) NONE SEEN   WBC, Wet Prep HPF POC MODERATE (A) NONE SEEN   Sperm NONE SEEN      MAU Course  Procedures  MDM Treated here in MAU today with 2g flagyl   Assessment and Plan   1. Trichomoniasis    DC home HIV/RPR pending Comfort measures reviewed  Partner treatment discussed. Patient will have partner go to HD for treatment.  RX: none  Return to MAU as needed   Follow-up [place] Follow up.   Specialty:  Family Medicine Contact information: [address] [phone] [place] [phone]           Marcille Buffy 05/31/2017, 8:18 AM

## 2017-06-03 LAB — GC/CHLAMYDIA PROBE AMP (~~LOC~~) NOT AT ARMC
Chlamydia: NEGATIVE
Neisseria Gonorrhea: NEGATIVE

## 2017-06-04 ENCOUNTER — Encounter: Payer: Self-pay | Admitting: Family Medicine

## 2017-06-04 NOTE — Progress Notes (Deleted)
   Subjective:   Patient ID: Kristina Moreno    DOB: 1966-04-10, 51 y.o. female   MRN: [id number]  CC: "***"  HPI: Kristina Moreno is a 51 y.o. female who presents to clinic today ***. Problems discussed today are as follows:  ***: *** ROS: ***  ***Seen on 8/10 for trichomoniasis and BV. Overdue for mammogram and tetanus booster.  Complete ROS performed, see HPI for pertinent.  H. Rivera Colon: COPD, vertigo, HLD, type 1 bipolar disorder, chronic back pain with sciatica. Surgical history TAH. Family history COPD, leukemia. Smoking status reviewed. Medications reviewed.  Objective:   There were no vitals taken for this visit. Vitals and nursing note reviewed.  General: well nourished, well developed, in no acute distress with non-toxic appearance HEENT: normocephalic, atraumatic, moist mucous membranes Neck: supple, non-tender without lymphadenopathy CV: regular rate and rhythm without murmurs, rubs, or gallops, no lower extremity edema Lungs: clear to auscultation bilaterally with normal work of breathing Abdomen: soft, non-tender, non-distended, no masses or organomegaly palpable, normoactive bowel sounds Skin: warm, dry, no rashes or lesions, cap refill < 2 seconds Extremities: warm and well perfused, normal tone  Assessment & Plan:   No problem-specific Assessment & Plan notes found for this encounter.  No orders of the defined types were placed in this encounter.  No orders of the defined types were placed in this encounter.   Harriet Butte, [place], PGY-2 06/04/2017 12:50 PM

## 2017-06-06 ENCOUNTER — Ambulatory Visit: Payer: Self-pay | Admitting: Family Medicine

## 2017-07-22 ENCOUNTER — Ambulatory Visit (INDEPENDENT_AMBULATORY_CARE_PROVIDER_SITE_OTHER): Payer: Self-pay | Admitting: *Deleted

## 2017-07-22 DIAGNOSIS — Z23 Encounter for immunization: Secondary | ICD-10-CM

## 2017-08-26 ENCOUNTER — Ambulatory Visit (HOSPITAL_COMMUNITY)
Admission: EM | Admit: 2017-08-26 | Discharge: 2017-08-26 | Disposition: A | Discharge status: Home / Self Care | Payer: Self-pay | Attending: Emergency Medicine | Admitting: Emergency Medicine

## 2017-08-26 ENCOUNTER — Encounter (HOSPITAL_COMMUNITY): Payer: Self-pay | Admitting: Emergency Medicine

## 2017-08-26 DIAGNOSIS — L02411 Cutaneous abscess of right axilla: Secondary | ICD-10-CM

## 2017-08-26 MED ORDER — AMOXICILLIN-POT CLAVULANATE 875-125 MG PO TABS: 1.0000 | ORAL_TABLET | Freq: Two times a day (BID) | ORAL | 0 refills | Status: DC

## 2017-08-26 NOTE — Discharge Instructions (Signed)
Start augmentin as directed. Keep wound clean and dry. You can use chlorhexidine wash when you shave to help prevent abscess from forming. Follow up with PCP if symptoms worsens/does not improve.

## 2017-08-26 NOTE — ED Triage Notes (Signed)
Pt sts right arm axillary abscess; pt sts hx of same

## 2017-08-26 NOTE — ED Provider Notes (Signed)
[place]    CSN: [id number] Arrival date & time: 08/26/17  1555     History   Chief Complaint Chief Complaint  Patient presents with  . Abscess    HPI Kristina Moreno is a 51 y.o. female.   51 year old female comes in with 3 day history of abscess to the right axilla. Patient states she gets abscesses quite often, some in the groin, some in the axilla. She shaved last week, which she knows is something that can cause her abscess to form. She usually uses antibacterial soap after shaving to decrease chances of abscess. She has been applying warm compresses without improvement for current episode. Denies fever, chills, night sweats. Does have surrounding erythema, with increased warmth.       Past Medical History:  Diagnosis Date  . Anxiety   . Asthma   . Borderline personality disorder ([place])   . Contact dermatitis   . COPD (chronic obstructive pulmonary disease) ([place])   . Fibroid   . Infection    UTI  . Meniere disease   . Sciatica     Patient Active Problem List   Diagnosis Date Noted  . Plantar wart, left foot 10/31/2016  . External hemorrhoids without complication 50/93/2671  . Ingrown nail 06/16/2016  . Moderate mixed bipolar I disorder ([place]) 04/23/2016  . Hyperlipidemia 02/06/2016  . Left-sided low back pain with sciatica 12/30/2015  . Meniere disease 10/30/2012  . Vertigo 02/20/2011  . Cannabis abuse 05/15/2007  . COPD (chronic obstructive pulmonary disease) ([place]) 05/15/2007    Past Surgical History:  Procedure Laterality Date  . TOTAL ABDOMINAL HYSTERECTOMY  2007  . transvaginal hysterectomy  04/03/2006   Pathology revealed benign uterus and cervix    OB History    Gravida Para Term Preterm AB Living   3 2 2   1 2    SAB TAB Ectopic Multiple Live Births     1             Home Medications    Prior to Admission medications   Medication Sig Start Date End Date Taking? Authorizing Provider  albuterol (PROAIR HFA) 108 (90 Base)  MCG/ACT inhaler INHALE 2 PUFFS INTO THE LUNGS EVERY 6 HOURS AS NEEDED FOR SHORTNESS OF BREATH/ASTHMA 12/30/15   Karamalegos, Devonne Doughty, DO  amoxicillin-clavulanate (AUGMENTIN) 875-125 MG tablet Take 1 tablet every 12 (twelve) hours by mouth. 08/26/17   Tasia Catchings, Mickelle Goupil V, PA-C  hydrochlorothiazide (MICROZIDE) 12.5 MG capsule TAKE 1 CAPSULE BY MOUTH DAILY 02/09/16   Parks Ranger, Devonne Doughty, DO  hydrOXYzine (ATARAX/VISTARIL) 10 MG tablet Take 10 mg by mouth 3 (three) times daily as needed for anxiety.    [provider]  ibuprofen (ADVIL,MOTRIN) 200 MG tablet Take 800 mg by mouth 2 (two) times daily.    [provider]  mometasone-formoterol (DULERA) 200-5 MCG/ACT AERO Inhale 2 puffs into the lungs 2 (two) times daily. 12/30/15   Olin Hauser, DO    Family History Family History  Problem Relation Age of Onset  . Leukemia Father   . COPD Mother   . Colon polyps Paternal Grandfather   . Anesthesia problems Neg Hx   . Hypotension Neg Hx   . Malignant hyperthermia Neg Hx   . Pseudochol deficiency Neg Hx   . Colon cancer Neg Hx   . Esophageal cancer Neg Hx   . Rectal cancer Neg Hx   . Stomach cancer Neg Hx     Social History Social History  Tobacco Use  . Smoking status: Former Smoker    Last attempt to quit: 12/14/2007    Years since quitting: 9.7  . Smokeless tobacco: Never Used  Substance Use Topics  . Alcohol use: Yes    Alcohol/week: 0.0 oz    Comment: occasionally  . Drug use: Yes    Frequency: 7.0 times per week    Types: Marijuana    Comment: patient states that she uses marijuana everyday.  Last smoked this am 0900 am. maw     Allergies   Patient has no known allergies.   Review of Systems Review of Systems  Reason unable to perform ROS: see HPI as above.     Physical Exam Triage Vital Signs ED Triage Vitals [08/26/17 1705]  Enc Vitals Group     BP 132/82     Pulse Rate 72     Resp 18     Temp 98.7 F (37.1 C)     Temp Source Oral      SpO2 100 %     Weight      Height      Head Circumference      Peak Flow      Pain Score      Pain Loc      Pain Edu?      Excl. in [place]?    No data found.  Updated Vital Signs BP 132/82 (BP Location: Right Arm)   Pulse 72   Temp 98.7 F (37.1 C) (Oral)   Resp 18   SpO2 100%   Visual Acuity Right Eye Distance:   Left Eye Distance:   Bilateral Distance:    Right Eye Near:   Left Eye Near:    Bilateral Near:     Physical Exam  Constitutional: She is oriented to person, place, and time. She appears well-developed and well-nourished. No distress.  HENT:  Head: Normocephalic and atraumatic.  Eyes: Conjunctivae are normal. Pupils are equal, round, and reactive to light.  Neurological: She is alert and oriented to person, place, and time.  Skin:  Right axilla with abscess 3cm x 1cm. With surrounding erythema, increased warmth. Tenderness to palpation     UC Treatments / Results  Labs (all labs ordered are listed, but only abnormal results are displayed) Labs Reviewed - No data to display  EKG  EKG Interpretation None       Radiology No results found.  Procedures Incision and Drainage Date/Time: 08/26/2017 6:45 PM Performed by: Ok Edwards, PA-C Authorized by: Melynda Ripple, MD   Consent:    Consent obtained:  Verbal   Consent given by:  Patient   Risks discussed:  Bleeding, incomplete drainage and infection   Alternatives discussed:  Alternative treatment Location:    Type:  Abscess   Size:  1cm x 3cm   Location:  Upper extremity   Upper extremity location:  Arm   Arm location:  R upper arm Pre-procedure details:    Skin preparation:  Betadine Anesthesia (see MAR for exact dosages):    Anesthesia method:  Local infiltration and topical application   Topical anesthesia: freezing spray.   Local anesthetic:  Lidocaine 2% WITH epi Procedure type:    Complexity:  Simple Procedure details:    Needle aspiration: no     Incision types:  Single  straight   Incision depth:  Dermal   Scalpel blade:  11   Wound management:  Probed and deloculated   Drainage:  Purulent   Drainage  amount:  Copious   Wound treatment:  Wound left open   Packing materials:  None Post-procedure details:    Patient tolerance of procedure:  Tolerated well, no immediate complications   (including critical care time)  Medications Ordered in UC Medications - No data to display   Initial Impression / Assessment and Plan / UC Course  I have reviewed the triage vital signs and the nursing notes.  Pertinent labs & imaging results that were available during my care of the patient were reviewed by me and considered in my medical decision making (see chart for details).    Start Augmentin for surrounding cellulitis and possible incomplete drainage. Wound care instructions given. Patient to follow up with PCP for wound recheck as needed. Return precautions given.   Final Clinical Impressions(s) / UC Diagnoses   Final diagnoses:  Abscess of axilla, right      Ok Edwards, PA-C 08/26/17 1848

## 2017-08-28 ENCOUNTER — Ambulatory Visit: Payer: Self-pay

## 2017-10-11 NOTE — Progress Notes (Deleted)
   Subjective   Patient ID: Kristina Moreno    DOB: Sep 25, 1966, 51 y.o. female   MRN: [id number]  CC: "***"  HPI: Kristina Moreno is a 51 y.o. female who presents to clinic today for the following:  ***: ***  ROS: see HPI for pertinent.  [place]: COPD, vertigo, HLD, type 1 bipolar disorder, chronic back pain with sciatica. Surgical history TAH. Family history COPD, leukemia (father). Smoking status reviewed. Medications reviewed.  Objective   There were no vitals taken for this visit. Vitals and nursing note reviewed.  General: well nourished, well developed, NAD with non-toxic appearance HEENT: normocephalic, atraumatic, moist mucous membranes Neck: supple, non-tender without lymphadenopathy Cardiovascular: regular rate and rhythm without murmurs, rubs, or gallops Lungs: clear to auscultation bilaterally with normal work of breathing Abdomen: soft, non-tender, non-distended, normoactive bowel sounds Skin: warm, dry, no rashes or lesions, cap refill < 2 seconds Extremities: warm and well perfused, normal tone, no edema  Assessment & Plan   No problem-specific Assessment & Plan notes found for this encounter.  No orders of the defined types were placed in this encounter.  No orders of the defined types were placed in this encounter.   Harriet Butte, [place], PGY-2 10/11/2017, 2:00 PM

## 2017-10-18 ENCOUNTER — Ambulatory Visit: Payer: Self-pay | Admitting: Family Medicine

## 2017-11-28 NOTE — Progress Notes (Signed)
Subjective   Patient ID: Kristina Moreno    DOB: 1966-05-09, 52 y.o. female   MRN: [id number]  CC: "Follow-up"  HPI: Kristina Moreno is a 52 y.o. female who presents to clinic today for the following:  Bipolar: Followed by Beverly Sessions with next appointment this month.  She was started on hydroxyzine for anxiety which has helped.  She was also told to start lithium but has not because of fear for exertional heat illness with the medication and working in from the [place] at [employer].  Patient states he occasionally hears her favorite songs in her head when she wakes up and occasionally has sensations of feeling low without symptoms of depression or SI/HI.  She is currently followed by therapist every other week.  COPD: Patient has albuterol with seldom use.  She also has Dulera but has been using it as needed.  She denies tobacco use but endorses regular marijuana use 3 times daily.  Marijuana use: Patient smokes marijuana 3 times daily.  She usually smokes with some of her children occasionally alone.  She states she uses a reliable source and has recently cut down on frequency because she feels that the marijuana has better quality.  ROS: see HPI for pertinent.  [place]: COPD, vertigo, HLD, type 1 bipolar disorder, chronic back pain with sciatica.  Surgical history TAH.  Family history COPD, leukemia (father).  Smoking status reviewed. Medications reviewed.  Objective   BP 98/68   Pulse 78   Temp 98 F (36.7 C) (Oral)   Ht 5\' 5"  (1.651 m)   Wt 166 lb 6.4 oz (75.5 kg)   SpO2 98%   BMI 27.69 kg/m  Vitals and nursing note reviewed.  General: well nourished, well developed, NAD with non-toxic appearance HEENT: normocephalic, atraumatic, moist mucous membranes Neck: supple, non-tender without lymphadenopathy Cardiovascular: regular rate and rhythm without murmurs, rubs, or gallops Lungs: clear to auscultation bilaterally with normal work of breathing Abdomen: soft, non-tender,  non-distended, normoactive bowel sounds Skin: warm, dry, no rashes or lesions, cap refill < 2 seconds Extremities: warm and well perfused, normal tone, no edema Psych: anxious appearing with pressured speech, euthymic mood with congruent affect  Assessment & Plan   COPD (chronic obstructive pulmonary disease) (HCC) Chronic. Seldom use of albuterol. On controller though taking inappropriatly. Smokes marijuana regularly. - Instructed patient to use Dulera 2 puffs twice daily scheduled to prevent exacerbations and albuterol inhaler as needed - Educated on importance of stopping marijuana  Moderate mixed bipolar I disorder (HCC) Chronic. Uncontrolled. Managed by [employer]. Recently given Lithium though patient not taking due to concern for exertional heat intolerance given job. Not currently in manic state and without SI/HI. - Urged patient to discuss alternative options concerning mood-stabilizer during next visit this month with [facility]  Cannabis abuse Chronic. Daily user. Seems to be influenced by social pressure including children. - Discussed risks for taking uncontrolled substances and risk with COPD  Orders Placed This Encounter  Procedures  . MM DIGITAL SCREENING BILATERAL    Standing Status:   Future    Standing Expiration Date:   01/28/2019    Order Specific Question:   Reason for Exam (SYMPTOM  OR DIAGNOSIS REQUIRED)    Answer:   Screening    Order Specific Question:   Is the patient pregnant?    Answer:   No    Order Specific Question:   Preferred imaging location?    Answer:   [facility]  . Ambulatory referral to Ophthalmology  Referral Priority:   Routine    Referral Type:   Consultation    Referral Reason:   Specialty Services Required    Requested Specialty:   Ophthalmology    Number of Visits Requested:   1   No orders of the defined types were placed in this encounter.   Harriet Butte, [place], PGY-2 12/02/2017, 1:59 PM

## 2017-11-29 ENCOUNTER — Ambulatory Visit (INDEPENDENT_AMBULATORY_CARE_PROVIDER_SITE_OTHER): Payer: Self-pay | Admitting: Family Medicine

## 2017-11-29 ENCOUNTER — Encounter: Payer: Self-pay | Admitting: Family Medicine

## 2017-11-29 ENCOUNTER — Other Ambulatory Visit: Payer: Self-pay

## 2017-11-29 VITALS — BP 98/68 | HR 78 | Temp 98.0°F | Ht 65.0 in | Wt 166.4 lb

## 2017-11-29 DIAGNOSIS — Z79899 Other long term (current) drug therapy: Secondary | ICD-10-CM

## 2017-11-29 DIAGNOSIS — Z1231 Encounter for screening mammogram for malignant neoplasm of breast: Secondary | ICD-10-CM

## 2017-11-29 DIAGNOSIS — F1210 Cannabis abuse, uncomplicated: Secondary | ICD-10-CM

## 2017-11-29 DIAGNOSIS — J449 Chronic obstructive pulmonary disease, unspecified: Secondary | ICD-10-CM

## 2017-11-29 DIAGNOSIS — Z1239 Encounter for other screening for malignant neoplasm of breast: Secondary | ICD-10-CM

## 2017-11-29 DIAGNOSIS — F3162 Bipolar disorder, current episode mixed, moderate: Secondary | ICD-10-CM

## 2017-11-29 NOTE — Patient Instructions (Signed)
Thank you for coming in to see [place] today. Please see below to review our plan for today's visit.  1.  It is important that you are on a mood stabilizer such as lithium.  I would encourage you to take the medication.  Talk with [facility] regarding alternatives during her next visit.  Continue seeing your therapist.  I am pleased to hear you are working! 2.  It is important that you stop smoking marijuana especially given your history of COPD.  Take the albuterol as needed if you have shortness of breath or wheezing.  You need to take the [facility] every day twice daily. 3.  I have referred you for your eye exam and for your mammogram screening.  Expect to receive a phone call this week regarding follow-up.  Please call the clinic at [phone] if your symptoms worsen or you have any concerns. It was our pleasure to serve you.  Harriet Butte, [place], PGY-2

## 2017-11-29 NOTE — Assessment & Plan Note (Addendum)
Chronic. Uncontrolled. Managed by [employer]. Recently given Lithium though patient not taking due to concern for exertional heat intolerance given job. Not currently in manic state and without SI/HI. - Urged patient to discuss alternative options concerning mood-stabilizer during next visit this month with [facility]

## 2017-11-29 NOTE — Assessment & Plan Note (Addendum)
Chronic. Seldom use of albuterol. On controller though taking inappropriatly. Smokes marijuana regularly. - Instructed patient to use Dulera 2 puffs twice daily scheduled to prevent exacerbations and albuterol inhaler as needed - Educated on importance of stopping marijuana

## 2017-11-29 NOTE — Assessment & Plan Note (Addendum)
Chronic. Daily user. Seems to be influenced by social pressure including children. - Discussed risks for taking uncontrolled substances and risk with COPD

## 2018-01-17 ENCOUNTER — Other Ambulatory Visit: Payer: Self-pay

## 2018-01-17 ENCOUNTER — Encounter: Payer: Self-pay | Admitting: Internal Medicine

## 2018-01-17 ENCOUNTER — Other Ambulatory Visit (HOSPITAL_COMMUNITY)
Admission: RE | Admit: 2018-01-17 | Discharge: 2018-01-17 | Disposition: A | Discharge status: Home / Self Care | Payer: Self-pay | Source: Ambulatory Visit | Attending: Family Medicine | Admitting: Family Medicine

## 2018-01-17 ENCOUNTER — Ambulatory Visit (INDEPENDENT_AMBULATORY_CARE_PROVIDER_SITE_OTHER): Payer: Self-pay | Admitting: Internal Medicine

## 2018-01-17 VITALS — BP 112/64 | HR 71 | Temp 98.0°F | Ht 65.0 in | Wt 168.0 lb

## 2018-01-17 DIAGNOSIS — Z7251 High risk heterosexual behavior: Secondary | ICD-10-CM

## 2018-01-17 LAB — POCT WET PREP (WET MOUNT)
CLUE CELLS WET PREP WHIFF POC: NEGATIVE
Trichomonas Wet Prep HPF POC: ABSENT

## 2018-01-17 NOTE — Patient Instructions (Addendum)
It was nice meeting you today Ms. Simeon Craft!  I will call you when the results of your other testing are available.   If you have any questions or concerns, please feel free to call the clinic.   Be well,  Dr. Avon Gully

## 2018-01-17 NOTE — Assessment & Plan Note (Signed)
With husband. No concern for exposure or vaginal discharge, however would like to be tested regardless. No abnormalities on pelvic exam today. Cervix surgically absent, so pap smear not indicated.  - Wet prep - GC/chlamydia - HIV, RPR - Hep C has been performed twice so will not repeat today (patient inquired about this) - Will call patient when results of testing have returned

## 2018-01-17 NOTE — Progress Notes (Signed)
   Subjective:   Patient: Kristina Moreno       Birthdate: 09-18-1966       MRN: [id number]      HPI  Kristina Moreno is a 52 y.o. female presenting for STD testing.   STD testing Patient wanting to be tested for STDs today. Recently got married so her and her husband are not using condoms anymore. Denies concern about specific exposures to STDs. Denies vaginal discharge, irritation, etc. Has history of hysterectomy and no longer has cervix.   Smoking status reviewed. Patient is former smoker.   Review of Systems See HPI.     Objective:  Physical Exam  Constitutional: She is oriented to person, place, and time and well-developed, well-nourished, and in no distress.  HENT:  Head: Normocephalic and atraumatic.  Pulmonary/Chest: Effort normal. No respiratory distress.  Genitourinary: Vagina normal, right adnexa normal and left adnexa normal. No vaginal discharge found.  Genitourinary Comments: Cervix absent  Neurological: She is alert and oriented to person, place, and time.  Psychiatric: Affect and judgment normal.      Assessment & Plan:  Unprotected sexual intercourse With husband. No concern for exposure or vaginal discharge, however would like to be tested regardless. No abnormalities on pelvic exam today. Cervix surgically absent, so pap smear not indicated.  - Wet prep - GC/chlamydia - HIV, RPR - Hep C has been performed twice so will not repeat today (patient inquired about this) - Will call patient when results of testing have returned   Adin Hector, MD, MPH PGY-3 [place] [place] [facility] Pager [phone]

## 2018-01-18 LAB — HIV ANTIBODY (ROUTINE TESTING W REFLEX): HIV SCREEN 4TH GENERATION: NONREACTIVE

## 2018-01-18 LAB — RPR: RPR Ser Ql: NONREACTIVE

## 2018-01-20 LAB — CERVICOVAGINAL ANCILLARY ONLY
CHLAMYDIA, DNA PROBE: NEGATIVE
NEISSERIA GONORRHEA: NEGATIVE

## 2018-01-20 NOTE — Progress Notes (Signed)
Called patient to inform her of negative STD screening. Patient voiced appreciation and denied any questions.   Adin Hector, MD, MPH PGY-3 [place] [facility] Pager [phone]

## 2018-02-15 ENCOUNTER — Inpatient Hospital Stay (HOSPITAL_COMMUNITY)
Admission: AD | Admit: 2018-02-15 | Discharge: 2018-02-15 | Disposition: A | Discharge status: Home / Self Care | Payer: Self-pay | Source: Ambulatory Visit | Attending: Obstetrics and Gynecology | Admitting: Obstetrics and Gynecology

## 2018-02-15 ENCOUNTER — Encounter (HOSPITAL_COMMUNITY): Payer: Self-pay | Admitting: *Deleted

## 2018-02-15 DIAGNOSIS — Z87891 Personal history of nicotine dependence: Secondary | ICD-10-CM | POA: Insufficient documentation

## 2018-02-15 DIAGNOSIS — R3129 Other microscopic hematuria: Secondary | ICD-10-CM | POA: Insufficient documentation

## 2018-02-15 DIAGNOSIS — N939 Abnormal uterine and vaginal bleeding, unspecified: Secondary | ICD-10-CM | POA: Insufficient documentation

## 2018-02-15 LAB — CBC
HCT: 40.3 % (ref 36.0–46.0)
HEMOGLOBIN: 12.8 g/dL (ref 12.0–15.0)
MCH: 26.7 pg (ref 26.0–34.0)
MCHC: 31.8 g/dL (ref 30.0–36.0)
MCV: 84.1 fL (ref 78.0–100.0)
Platelets: 218 10*3/uL (ref 150–400)
RBC: 4.79 MIL/uL (ref 3.87–5.11)
RDW: 15.3 % (ref 11.5–15.5)
WBC: 7.4 10*3/uL (ref 4.0–10.5)

## 2018-02-15 LAB — URINALYSIS, ROUTINE W REFLEX MICROSCOPIC
Bacteria, UA: NONE SEEN
Bilirubin Urine: NEGATIVE
Glucose, UA: NEGATIVE mg/dL
Ketones, ur: NEGATIVE mg/dL
Nitrite: NEGATIVE
Protein, ur: 100 mg/dL — AB
RBC / HPF: >50 RBC/hpf — ABNORMAL HIGH (ref 0–5)
Specific Gravity, Urine: 1.018 (ref 1.005–1.030)
WBC, UA: >50 WBC/hpf — ABNORMAL HIGH (ref 0–5)
pH: 5 (ref 5.0–8.0)

## 2018-02-15 NOTE — Discharge Instructions (Signed)

## 2018-02-15 NOTE — MAU Note (Signed)
Patient presents with vaginal bleeding that started this AM, passing nickel sized stringy clot.  Had partial hysterectomy about 10 years ago.  States feeling the "need to pee even after using the bathroom."  Denies pain.

## 2018-02-15 NOTE — MAU Provider Note (Signed)
History     CSN: [id number]  Arrival date and time: 02/15/18 6948   First Provider Initiated Contact with Patient 02/15/18 [phone]      Chief Complaint  Patient presents with  . Vaginal Bleeding   52 y.o. postmenopausal female here with vaginal bleeding. She reports seeing red blood and a small clot on the toilet tissue this am. Denies vaginal discharge prior to this. Last IC was last night. She's a newlywed and reports "having lots and lots of sex". Denies aggressive IC or using foreign objects. Denies urinary sx except urgency this am. Having some minimal low abd cramping this am. She thinks the bleeding is vaginal but completely sure. She is s/p TAH.  Past Medical History:  Diagnosis Date  . Anxiety   . Asthma   . Borderline personality disorder ([place])   . Contact dermatitis   . COPD (chronic obstructive pulmonary disease) ([place])   . Fibroid   . Infection    UTI  . Meniere disease   . Sciatica     Past Surgical History:  Procedure Laterality Date  . TOTAL ABDOMINAL HYSTERECTOMY  2007  . transvaginal hysterectomy  04/03/2006   Pathology revealed benign uterus and cervix    Family History  Problem Relation Age of Onset  . Leukemia Father   . COPD Mother   . Colon polyps Paternal Grandfather   . Anesthesia problems Neg Hx   . Hypotension Neg Hx   . Malignant hyperthermia Neg Hx   . Pseudochol deficiency Neg Hx   . Colon cancer Neg Hx   . Esophageal cancer Neg Hx   . Rectal cancer Neg Hx   . Stomach cancer Neg Hx     Social History   Tobacco Use  . Smoking status: Former Smoker    Last attempt to quit: 12/14/2007    Years since quitting: 10.1  . Smokeless tobacco: Never Used  Substance Use Topics  . Alcohol use: Yes    Alcohol/week: 0.0 oz    Comment: occasionally  . Drug use: Yes    Frequency: 7.0 times per week    Types: Marijuana    Comment: patient states that she uses marijuana everyday.  Last smoked this am 0900 am. maw    Allergies: No Known  Allergies  Medications Prior to Admission  Medication Sig Dispense Refill Last Dose  . albuterol (PROAIR HFA) 108 (90 Base) MCG/ACT inhaler INHALE 2 PUFFS INTO THE LUNGS EVERY 6 HOURS AS NEEDED FOR SHORTNESS OF BREATH/ASTHMA 8.5 g 5 02/14/2018 at Unknown time  . hydrOXYzine (ATARAX/VISTARIL) 10 MG tablet Take 10 mg by mouth 3 (three) times daily as needed for anxiety.   02/14/2018 at Unknown time  . ibuprofen (ADVIL,MOTRIN) 200 MG tablet Take 800 mg by mouth every 8 (eight) hours as needed for mild pain.    02/14/2018 at Unknown time  . mometasone-formoterol (DULERA) 200-5 MCG/ACT AERO Inhale 2 puffs into the lungs 2 (two) times daily. 13 g 11 02/14/2018 at Unknown time  . Multiple Vitamin (MULTIVITAMIN WITH MINERALS) TABS tablet Take 1 tablet by mouth daily.   Past Week at Unknown time  . hydrochlorothiazide (MICROZIDE) 12.5 MG capsule TAKE 1 CAPSULE BY MOUTH DAILY (Patient not taking: Reported on 11/29/2017) 360 capsule 0 Not Taking at Unknown time    Review of Systems  Gastrointestinal: Positive for abdominal pain.  Genitourinary: Positive for urgency and vaginal bleeding. Negative for dyspareunia, dysuria, frequency, vaginal discharge and vaginal pain.   Physical Exam   Blood  pressure 127/83, pulse 69, temperature 98.3 F (36.8 C), temperature source Oral, resp. rate 18, height 5\' 5"  (1.651 m), weight 169 lb 12 oz (77 kg), SpO2 100 %.  Physical Exam  Nursing note and vitals reviewed. Constitutional: She is oriented to person, place, and time. She appears well-developed and well-nourished. No distress.  HENT:  Head: Normocephalic and atraumatic.  Neck: Normal range of motion.  Cardiovascular: Normal rate.  Respiratory: Effort normal. No respiratory distress.  GI: Soft. She exhibits no distension and no mass. There is no tenderness. There is no rebound and no guarding.  Genitourinary:  Genitourinary Comments: External: no lesions or erythema Vagina: rugated, pink, moist, scant thin white  discharge, no blood or trace Uterus: absent Adnexae: non palpable Cervix absent   Musculoskeletal: Normal range of motion.  Neurological: She is alert and oriented to person, place, and time.  Skin: Skin is warm and dry.  Psychiatric: She has a normal mood and affect.   Results for orders placed or performed during the hospital encounter of 02/15/18 (from the past 24 hour(s))  Urinalysis, Routine w reflex microscopic     Status: Abnormal   Collection Time: 02/15/18  7:43 AM  Result Value Ref Range   Color, Urine YELLOW YELLOW   APPearance CLOUDY (A) CLEAR   Specific Gravity, Urine 1.018 1.005 - 1.030   pH 5.0 5.0 - 8.0   Glucose, UA NEGATIVE NEGATIVE mg/dL   Hgb urine dipstick LARGE (A) NEGATIVE   Bilirubin Urine NEGATIVE NEGATIVE   Ketones, ur NEGATIVE NEGATIVE mg/dL   Protein, ur 100 (A) NEGATIVE mg/dL   Nitrite NEGATIVE NEGATIVE   Leukocytes, UA LARGE (A) NEGATIVE   RBC / HPF >50 (H) 0 - 5 RBC/hpf   WBC, UA >50 (H) 0 - 5 WBC/hpf   Bacteria, UA NONE SEEN NONE SEEN   Squamous Epithelial / LPF 0-5 0 - 5   Non Squamous Epithelial 0-5 (A) NONE SEEN  CBC     Status: None   Collection Time: 02/15/18  8:33 AM  Result Value Ref Range   WBC 7.4 4.0 - 10.5 K/uL   RBC 4.79 3.87 - 5.11 MIL/uL   Hemoglobin 12.8 12.0 - 15.0 g/dL   HCT 40.3 36.0 - 46.0 %   MCV 84.1 78.0 - 100.0 fL   MCH 26.7 26.0 - 34.0 pg   MCHC 31.8 30.0 - 36.0 g/dL   RDW 15.3 11.5 - 15.5 %   Platelets 218 150 - 400 K/uL   MAU Course  Procedures  MDM Labs ordered and reviewed. No evidence of VB. Hematuria noted but no bacteria, will send UC to r/o UTI. Unlikely nephrolithiasis. Stable for discharge home.  Assessment and Plan   1. Other microscopic hematuria    Discharge home Follow up with PCP prn Return to MAU for OBGYN emergencies  Allergies as of 02/15/2018   No Known Allergies     Medication List    STOP taking these medications   hydrochlorothiazide 12.5 MG capsule Commonly known as:   MICROZIDE     TAKE these medications   albuterol 108 (90 Base) MCG/ACT inhaler Commonly known as:  PROAIR HFA INHALE 2 PUFFS INTO THE LUNGS EVERY 6 HOURS AS NEEDED FOR SHORTNESS OF BREATH/ASTHMA   hydrOXYzine 10 MG tablet Commonly known as:  ATARAX/VISTARIL Take 10 mg by mouth 3 (three) times daily as needed for anxiety.   ibuprofen 200 MG tablet Commonly known as:  ADVIL,MOTRIN Take 800 mg by mouth every 8 (eight) hours  as needed for mild pain.   mometasone-formoterol 200-5 MCG/ACT Aero Commonly known as:  DULERA Inhale 2 puffs into the lungs 2 (two) times daily.   multivitamin with minerals Tabs tablet Take 1 tablet by mouth daily.      Julianne Handler, CNM 02/15/2018, 8:55 AM

## 2018-02-17 LAB — URINE CULTURE: Culture: 30000 — AB

## 2018-02-18 ENCOUNTER — Other Ambulatory Visit: Payer: Self-pay | Admitting: Advanced Practice Midwife

## 2018-02-18 DIAGNOSIS — N3001 Acute cystitis with hematuria: Secondary | ICD-10-CM

## 2018-02-18 MED ORDER — SULFAMETHOXAZOLE-TRIMETHOPRIM 800-160 MG PO TABS: 1.0000 | ORAL_TABLET | Freq: Two times a day (BID) | ORAL | 1 refills | Status: DC

## 2018-02-18 NOTE — Progress Notes (Signed)
Urine Culture pos 30K Proteus, but pt symptomatic w/ urgency and hematuria. Will Tx w/ Bactrim.

## 2018-02-19 ENCOUNTER — Telehealth: Payer: Self-pay | Admitting: Certified Nurse Midwife

## 2018-02-19 NOTE — Telephone Encounter (Signed)
Pt called requesting test results. Pt informed of +UTI and Rx sent for Bactrim. Pt verbalizes understanding.

## 2018-04-27 ENCOUNTER — Ambulatory Visit (HOSPITAL_COMMUNITY)
Admission: EM | Admit: 2018-04-27 | Discharge: 2018-04-27 | Disposition: A | Discharge status: Home / Self Care | Payer: Self-pay | Attending: Family Medicine | Admitting: Family Medicine

## 2018-04-27 ENCOUNTER — Encounter (HOSPITAL_COMMUNITY): Payer: Self-pay | Admitting: Emergency Medicine

## 2018-04-27 ENCOUNTER — Other Ambulatory Visit: Payer: Self-pay

## 2018-04-27 DIAGNOSIS — M62830 Muscle spasm of back: Secondary | ICD-10-CM

## 2018-04-27 MED ORDER — CYCLOBENZAPRINE HCL 10 MG PO TABS: 10.0000 mg | ORAL_TABLET | Freq: Two times a day (BID) | ORAL | 0 refills | Status: DC | PRN

## 2018-04-27 MED ORDER — NAPROXEN 500 MG PO TABS: 500.0000 mg | ORAL_TABLET | Freq: Two times a day (BID) | ORAL | 0 refills | Status: DC | PRN

## 2018-04-27 NOTE — Discharge Instructions (Signed)
Continue conservative management of rest, ice, heat, and gentle stretches °Take naproxen as needed for pain relief (may cause abdominal discomfort, ulcers, and GI bleeds avoid taking with other NSAIDs) °Take cyclobenzaprine at nighttime for symptomatic relief. Avoid driving or operating heavy machinery while using medication. °Follow up with PCP if symptoms persist °Return or go to the ER if you have any new or worsening symptoms (fever, chills, chest pain, abdominal pain, changes in bowel or bladder habits, pain radiating into lower legs, etc...)  °

## 2018-04-27 NOTE — ED Triage Notes (Addendum)
Left flank pain for 2 days.  Took mom-having good results, but no change to pain.  Denies pain with urination.  Denies vaginal discharge.  No known injury.  Pain worsens with movement.  Patient has a new mattress

## 2018-04-27 NOTE — ED Provider Notes (Signed)
[place]   [id number] 04/27/18 Arrival Time: 6270  SUBJECTIVE: History from: patient. Kristina Moreno is a 52 y.o. female complains of left sided back pain that began 2 days ago.  Denies a precipitating event or specific injury, but recently purchased and installed a new mattress at home.  Localizes the pain to the left side of back.  Describes the pain as constant and throbbing in character.  Has tried acetaminophen 500mg  without relief.  Symptoms are made worse with ROM about the lumbar spine, particularly leaning forward.  Denies similar symptoms in the past.  Denies fever, chills, erythema, ecchymosis, effusion, weakness, numbness and tingling, vaginal or urinary symptoms.    ROS: As per HPI.  Past Medical History:  Diagnosis Date  . Anxiety   . Asthma   . Borderline personality disorder ([place])   . Contact dermatitis   . COPD (chronic obstructive pulmonary disease) ([place])   . Fibroid   . Infection    UTI  . Meniere disease   . Sciatica    Past Surgical History:  Procedure Laterality Date  . TOTAL ABDOMINAL HYSTERECTOMY  2007  . transvaginal hysterectomy  04/03/2006   Pathology revealed benign uterus and cervix   No Known Allergies No current facility-administered medications on file prior to encounter.    Current Outpatient Medications on File Prior to Encounter  Medication Sig Dispense Refill  . acetaminophen (TYLENOL) 325 MG tablet Take 650 mg by mouth every 6 (six) hours as needed.    Marland Kitchen ibuprofen (ADVIL,MOTRIN) 200 MG tablet Take 800 mg by mouth every 8 (eight) hours as needed for mild pain.     Marland Kitchen albuterol (PROAIR HFA) 108 (90 Base) MCG/ACT inhaler INHALE 2 PUFFS INTO THE LUNGS EVERY 6 HOURS AS NEEDED FOR SHORTNESS OF BREATH/ASTHMA 8.5 g 5  . hydrOXYzine (ATARAX/VISTARIL) 10 MG tablet Take 10 mg by mouth 3 (three) times daily as needed for anxiety.    . mometasone-formoterol (DULERA) 200-5 MCG/ACT AERO Inhale 2 puffs into the lungs 2 (two) times daily. 13 g 11    . Multiple Vitamin (MULTIVITAMIN WITH MINERALS) TABS tablet Take 1 tablet by mouth daily.    Marland Kitchen sulfamethoxazole-trimethoprim (BACTRIM DS,SEPTRA DS) 800-160 MG tablet Take 1 tablet by mouth 2 (two) times daily. 14 tablet 1   Social History   Socioeconomic History  . Marital status: Single    Spouse name: Not on file  . Number of children: 2  . Years of education: Not on file  . Highest education level: Not on file  Occupational History  . Occupation: Investment banker, operational  . Financial resource strain: Not on file  . Food insecurity:    Worry: Not on file    Inability: Not on file  . Transportation needs:    Medical: Not on file    Non-medical: Not on file  Tobacco Use  . Smoking status: Former Smoker    Last attempt to quit: 12/14/2007    Years since quitting: 10.3  . Smokeless tobacco: Never Used  Substance and Sexual Activity  . Alcohol use: Yes    Alcohol/week: 0.0 oz    Comment: occasionally  . Drug use: Yes    Frequency: 7.0 times per week    Types: Marijuana    Comment: patient states that she uses marijuana everyday.  Last smoked this am 0900 am. maw  . Sexual activity: Yes    Birth control/protection: Surgical  Lifestyle  . Physical activity:    Days per week:  Not on file    Minutes per session: Not on file  . Stress: Not on file  Relationships  . Social connections:    Talks on phone: Not on file    Gets together: Not on file    Attends religious service: Not on file    Active member of club or organization: Not on file    Attends meetings of clubs or organizations: Not on file    Relationship status: Not on file  . Intimate partner violence:    Fear of current or ex partner: Not on file    Emotionally abused: Not on file    Physically abused: Not on file    Forced sexual activity: Not on file  Other Topics Concern  . Not on file  Social History Narrative  . Not on file   Family History  Problem Relation Age of Onset  . Leukemia Father   . COPD  Mother   . Colon polyps Paternal Grandfather   . Anesthesia problems Neg Hx   . Hypotension Neg Hx   . Malignant hyperthermia Neg Hx   . Pseudochol deficiency Neg Hx   . Colon cancer Neg Hx   . Esophageal cancer Neg Hx   . Rectal cancer Neg Hx   . Stomach cancer Neg Hx     OBJECTIVE:  Vitals:   04/27/18 1525  BP: 103/70  Pulse: 75  Resp: 18  Temp: 98.5 F (36.9 C)  TempSrc: Oral  SpO2: 98%    General appearance: AOx3; in no acute distress.  Head: NCAT Lungs: CTA bilaterally Heart: RRR.  Clear S1 and S2 without murmur, gallops, or rubs.  Radial pulses 2+ bilaterally. Musculoskeletal: Lumbar spine Inspection: Skin warm, dry, clear and intact without obvious erythema, effusion, or ecchymosis.  Palpation: Tender to palpation about the left side thoracic paravertebral muscles ROM: FROM active and passive Strength: 5/5 shld abduction, 5/5 shld adduction, 5/5 elbow flexion, 5/5 elbow extension, 5/5 grip strength, 5/5 hip flexion, 5/5 knee abduction, 5/5 knee adduction, 5/5 knee flexion, 5/5 knee extension, 5/5 dorsiflexion, 5/5 plantar flexion Skin: warm and dry Neurologic: Ambulates without difficulty; CN 2-12 grossly intact; Sensation intact about the upper/ lower extremities Psychological: alert and cooperative; normal mood and affect  ASSESSMENT & PLAN:  1. Back muscle spasm     @NIMG @  Meds ordered this encounter  Medications  . naproxen (NAPROSYN) 500 MG tablet    Sig: Take 1 tablet (500 mg total) by mouth 2 (two) times daily as needed for moderate pain.    Dispense:  30 tablet    Refill:  0    Order Specific Question:   Supervising Provider    Answer:   Wynona Luna [phone]  . cyclobenzaprine (FLEXERIL) 10 MG tablet    Sig: Take 1 tablet (10 mg total) by mouth 2 (two) times daily as needed for muscle spasms.    Dispense:  20 tablet    Refill:  0    Order Specific Question:   Supervising Provider    Answer:   Wynona Luna [[id number]]     Continue conservative management of rest, ice, heat, and gentle stretches Take naproxen as needed for pain relief (may cause abdominal discomfort, ulcers, and GI bleeds avoid taking with other NSAIDs) Take cyclobenzaprine at nighttime for symptomatic relief. Avoid driving or operating heavy machinery while using medication. Follow up with PCP if symptoms persist Return or go to the ER if you have any new or worsening symptoms (fever,  chills, chest pain, abdominal pain, changes in bowel or bladder habits, pain radiating into lower legs, etc...)   Reviewed expectations re: course of current medical issues. Questions answered. Outlined signs and symptoms indicating need for more acute intervention. Patient verbalized understanding. After Visit Summary given.    Lestine Box, PA-C 04/27/18 1548

## 2018-05-02 ENCOUNTER — Ambulatory Visit: Payer: Self-pay | Admitting: Family Medicine

## 2018-05-02 NOTE — Progress Notes (Deleted)
   Subjective   Patient ID: Kristina Moreno    DOB: Dec 26, 1965, 52 y.o. female   MRN: [id number]  CC: "***"  HPI: Kristina Moreno is a 52 y.o. female who presents for a same day appointment for the following:  BACK PAIN  Onset: *** Pain characteristic: *** Patient has tried: *** Pain radiates: *** History of trauma or injury: *** Prior history of similar pain: *** History of cancer: *** Weak immune system: *** History of IV drug use: *** History of steroid use: ***  Symptoms Incontinence of bowel or bladder: *** Saddle anesthesia: *** Radiculopathy: *** Fever: *** Rest or nocturnal pain: *** Weight Loss: *** Rash: ***  ROS: see HPI for pertinent.  [place]: COPD, vertigo, HLD, type 1 bipolar disorder, chronic back pain with sciatica.  Surgical history TAH.  Family history COPD, leukemia (father).  Smoking status reviewed. Medications reviewed.  Objective   There were no vitals taken for this visit. Vitals and nursing note reviewed.  General: well nourished, well developed, NAD with non-toxic appearance HEENT: normocephalic, atraumatic, moist mucous membranes Neck: supple, non-tender without lymphadenopathy Cardiovascular: regular rate and rhythm without murmurs, rubs, or gallops Lungs: clear to auscultation bilaterally with normal work of breathing Abdomen: soft, non-tender, non-distended, normoactive bowel sounds Skin: warm, dry, no rashes or lesions, cap refill < 2 seconds Extremities: warm and well perfused, normal tone, no edema  Assessment & Plan   No problem-specific Assessment & Plan notes found for this encounter.  No orders of the defined types were placed in this encounter.  No orders of the defined types were placed in this encounter.   Kristina Moreno, [place], PGY-3 05/02/2018, 11:54 AM

## 2018-08-08 ENCOUNTER — Ambulatory Visit: Payer: Self-pay

## 2018-08-27 ENCOUNTER — Ambulatory Visit (INDEPENDENT_AMBULATORY_CARE_PROVIDER_SITE_OTHER): Payer: Self-pay

## 2018-08-27 DIAGNOSIS — Z23 Encounter for immunization: Secondary | ICD-10-CM

## 2018-09-08 ENCOUNTER — Ambulatory Visit: Payer: Self-pay | Admitting: Family Medicine

## 2018-10-06 ENCOUNTER — Ambulatory Visit (INDEPENDENT_AMBULATORY_CARE_PROVIDER_SITE_OTHER): Payer: Self-pay | Admitting: Pharmacist

## 2018-10-06 ENCOUNTER — Encounter: Payer: Self-pay | Admitting: Pharmacist

## 2018-10-06 DIAGNOSIS — J449 Chronic obstructive pulmonary disease, unspecified: Secondary | ICD-10-CM

## 2018-10-06 MED ORDER — ALBUTEROL SULFATE HFA 108 (90 BASE) MCG/ACT IN AERS
2.0000 | INHALATION_SPRAY | Freq: Four times a day (QID) | RESPIRATORY_TRACT | 2 refills | Status: DC | PRN
Start: 2018-10-06 — End: 2019-05-25

## 2018-10-06 MED ORDER — ALBUTEROL SULFATE HFA 108 (90 BASE) MCG/ACT IN AERS: INHALATION_SPRAY | RESPIRATORY_TRACT | 2 refills | Status: DC

## 2018-10-06 MED ORDER — FLUTICASONE FUROATE-VILANTEROL 100-25 MCG/INH IN AEPB: 1.0000 | INHALATION_SPRAY | Freq: Every day | RESPIRATORY_TRACT | 0 refills | Status: DC

## 2018-10-06 NOTE — Progress Notes (Signed)
Kristina Moreno is a 52 y.o. female reports to clinical pharmacist appointment for COPD medication management. Patient did not bring medications to visit.   Patient's current COPD medication regimen consists of: Dulera (mometasone-formoterol) and Albuterol. However, she mentions that her current supply of inhalers from the [facility] had run out approximately 2 weeks ago.  Patient reports COPD related symptoms including shortness of breath. Patient acknowledges that she is not entirely sure how to differentiate between potential COPD symptoms and panic attack symptoms.  She states her breathing today was 8/10 - doing very well and without major limitation today.   Patient reports  use of rescue inhaler with an estimated use of about 2 times per day.   Patient reports no COPD exacerbation(s) in the past year. No exacerbations reported since COPD was first documented in her health record in 2008.  Patient reports a history of smoking for 27 years until she quit in 2009. 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 Reports current use of smoked marijuana.  Patient has no previous PFTs on file, so procedure was performed in clinic today.  No Known Allergies Prior to Admission medications   Medication Sig Start Date End Date Taking? Authorizing Provider  ibuprofen (ADVIL,MOTRIN) 200 MG tablet Take 800 mg by mouth every 8 (eight) hours as needed for mild pain.    Yes [provider]  mometasone-formoterol (DULERA) 200-5 MCG/ACT AERO Inhale 2 puffs into the lungs 2 (two) times daily. 12/30/15  Yes Karamalegos, Devonne Doughty, DO  acetaminophen (TYLENOL) 325 MG tablet Take 650 mg by mouth every 6 (six) hours as needed.    [provider]  albuterol (PROAIR HFA) 108 (90 Base) MCG/ACT inhaler INHALE 2 PUFFS INTO THE LUNGS EVERY 6 HOURS AS NEEDED FOR SHORTNESS OF BREATH/ASTHMA Patient not taking: Reported on 10/06/2018 12/30/15   Olin Hauser, DO   cyclobenzaprine (FLEXERIL) 10 MG tablet Take 1 tablet (10 mg total) by mouth 2 (two) times daily as needed for muscle spasms. Patient not taking: Reported on 10/06/2018 04/27/18   Stacey Drain, [place], PA-C  hydrOXYzine (ATARAX/VISTARIL) 10 MG tablet Take 10 mg by mouth 3 (three) times daily as needed for anxiety.    [provider]  Multiple Vitamin (MULTIVITAMIN WITH MINERALS) TABS tablet Take 1 tablet by mouth daily.    [provider]   Past Medical History:  Diagnosis Date  . Anxiety   . Asthma   . Borderline personality disorder ([place])   . Contact dermatitis   . COPD (chronic obstructive pulmonary disease) ([place])   . Fibroid   . Infection    UTI  . Meniere disease   . Sciatica    Social History   Socioeconomic History  . Marital status: Single    Spouse name: Not on file  . Number of children: 2  . Years of education: Not on file  . Highest education level: Not on file  Occupational History  . Occupation: Investment banker, operational  . Financial resource strain: Not on file  . Food insecurity:    Worry: Not on file    Inability: Not on file  . Transportation needs:    Medical: Not on file    Non-medical: Not on file  Tobacco Use  . Smoking status: Former Smoker    Last attempt to quit: 12/14/2007    Years since quitting: 10.8  . Smokeless tobacco: Never Used  Substance and Sexual Activity  . Alcohol use: Yes    Alcohol/week: 0.0  standard drinks    Comment: occasionally  . Drug use: Yes    Frequency: 7.0 times per week    Types: Marijuana    Comment: patient states that she uses marijuana everyday.  Last smoked this am 0900 am. maw  . Sexual activity: Yes    Birth control/protection: Surgical  Lifestyle  . Physical activity:    Days per week: Not on file    Minutes per session: Not on file  . Stress: Not on file  Relationships  . Social connections:    Talks on phone: Not on file    Gets together: Not on file    Attends religious service: Not on  file    Active member of club or organization: Not on file    Attends meetings of clubs or organizations: Not on file    Relationship status: Not on file  Other Topics Concern  . Not on file  Social History Narrative  . Not on file   Family History  Problem Relation Age of Onset  . Leukemia Father   . COPD Mother   . Colon polyps Paternal Grandfather   . Anesthesia problems Neg Hx   . Hypotension Neg Hx   . Malignant hyperthermia Neg Hx   . Pseudochol deficiency Neg Hx   . Colon cancer Neg Hx   . Esophageal cancer Neg Hx   . Rectal cancer Neg Hx   . Stomach cancer Neg Hx     O: Ht Readings from Last 2 Encounters:  02/15/18 5\' 5"  (1.651 m)  01/17/18 5\' 5"  (1.651 m)   Wt Readings from Last 2 Encounters:  02/15/18 169 lb 12 oz (77 kg)  01/17/18 168 lb (76.2 kg)   There is no height or weight on file to calculate BMI.   A/P:  Findings/Recommendations:  . Based on PFT lung function appears to be near normal spirometry compared to a person of her age despite long-term smoking history including smoking crack cocaine for many years.  . Patient was considered for de-escalation from her current ICS/LABA regimen Ruthe Mannan) to a LABA/LAMA regimen as per the criteria outlined in the 2019 GOLD COPD Guidelines. However, PFT results do not indicate that Ms. Holm is a member of the target population of those de-escalation criteria. . Patient was educated on the potential for lung damage caused by smoking marijuana and encouraged to quit. She seemed reluctant today, but seemed optimistic about the possibility of that change sometime in the near future. . Will initiate Breo Elipta (Fluticasone/Vilanterol) as a temporary therapy based on clinic sample availability until her next appointment with her PCP for reassessment of her lung conditions including review of the PFT from today. Counseled patient on proper use and possible side effects of BREO medication. Educated patient on proper use of  inhalers, importance of medication adherence  . Counseled patient to report any changes of inhaler medications, breathing, or shortness of breath. Recommended that patient schedule a follow-up appointment with her PCP in the next 2 weeks to reevaluate lung conditions dyspnea, chest tightness with consideration of LABA therapy potentially worsening axiety.  Encourage quitting marijuana, and consider inhaled steroid therapy only at next visit with PCP.    The patient verbalized understanding of information provided by repeating back concepts discussed.  Patient seen with Brett Fairy, PharmD student.

## 2018-10-06 NOTE — Patient Instructions (Signed)
Great to meet you today.   Your breathing test showed "Normal Lung Function"  We continued your current inhaler therapy in a new device  Take BREO 1 inhalation once daily  Pick up a new albuterol inhaler at the pharamcy and use 2 puffs as needed.   Follow-up with Dr. Yisroel Ramming to discuss changes - long-term to your inhaler regimen.   Remember that quitting all smoking is the best thing to do for your lungs.

## 2018-10-07 ENCOUNTER — Encounter: Payer: Self-pay | Admitting: Pharmacist

## 2018-10-07 NOTE — Progress Notes (Signed)
Patient ID: Kristina Moreno, female   DOB: Mar 06, 1966, 52 y.o.   MRN: [id number] Reviewed: I agree with Dr. Graylin Shiver documentation and management.

## 2018-10-07 NOTE — Assessment & Plan Note (Signed)
.   Based on PFT lung function appears to be near normal spirometry compared to a person of her age despite long-term smoking history including smoking crack cocaine for many years.  . Patient was considered for de-escalation from her current ICS/LABA regimen Ruthe Mannan) to a LABA/LAMA regimen as per the criteria outlined in the 2019 GOLD COPD Guidelines. However, PFT results do not indicate that Ms. Selk is a member of the target population of those de-escalation criteria. . Patient was educated on the potential for lung damage caused by smoking marijuana and encouraged to quit. She seemed reluctant today, but seemed optimistic about the possibility of that change sometime in the near future. . Will initiate Breo Elipta (Fluticasone/Vilanterol) as a temporary therapy based on clinic sample availability until her next appointment with her PCP for reassessment of her lung conditions including review of the PFT from today. Counseled patient on proper use and possible side effects of BREO medication. Educated patient on proper use of inhalers, importance of medication adherence  . Counseled patient to report any changes of inhaler medications, breathing, or shortness of breath. Recommended that patient schedule a follow-up appointment with her PCP in the next 2 weeks to reevaluate lung conditions dyspnea, chest tightness with consideration of LABA therapy potentially worsening axiety.  Encourage quitting marijuana, and consider inhaled steroid therapy only at next visit with PCP.

## 2018-11-02 ENCOUNTER — Inpatient Hospital Stay (HOSPITAL_COMMUNITY)
Admission: AD | Admit: 2018-11-02 | Discharge: 2018-11-02 | Disposition: A | Discharge status: Home / Self Care | Payer: Self-pay | Attending: Obstetrics & Gynecology | Admitting: Obstetrics & Gynecology

## 2018-11-02 DIAGNOSIS — Z87891 Personal history of nicotine dependence: Secondary | ICD-10-CM | POA: Insufficient documentation

## 2018-11-02 DIAGNOSIS — N390 Urinary tract infection, site not specified: Secondary | ICD-10-CM

## 2018-11-02 DIAGNOSIS — R319 Hematuria, unspecified: Secondary | ICD-10-CM

## 2018-11-02 LAB — URINALYSIS, ROUTINE W REFLEX MICROSCOPIC
Bacteria, UA: NONE SEEN
Bilirubin Urine: NEGATIVE
Glucose, UA: NEGATIVE mg/dL
KETONES UR: NEGATIVE mg/dL
NITRITE: NEGATIVE
PH: 5 (ref 5.0–8.0)
Protein, ur: 100 mg/dL — AB
RBC / HPF: >50 RBC/hpf — ABNORMAL HIGH (ref 0–5)
SPECIFIC GRAVITY, URINE: 1.027 (ref 1.005–1.030)

## 2018-11-02 LAB — WET PREP, GENITAL
CLUE CELLS WET PREP: NONE SEEN
Sperm: NONE SEEN
Trich, Wet Prep: NONE SEEN
Yeast Wet Prep HPF POC: NONE SEEN

## 2018-11-02 MED ORDER — CIPROFLOXACIN HCL 500 MG PO TABS: 500.0000 mg | ORAL_TABLET | Freq: Two times a day (BID) | ORAL | 0 refills | Status: AC

## 2018-11-02 NOTE — MAU Note (Signed)
Thinks she has a UTI- having some blood in urine, states it is hard to pee, having burning, urgency, and frequency

## 2018-11-02 NOTE — Discharge Instructions (Signed)
Drink at least 8 8-oz glasses of water every day. Get your medicine at the pharmacy and take all as directed. See your primary care MD for follow up as you have had this twice in one year.

## 2018-11-02 NOTE — MAU Provider Note (Signed)
History     CSN: [id number]  Arrival date and time: 11/02/18 9622   Client seen in triage today.    Chief Complaint  Patient presents with  . Dysuria   HPI Kristina Moreno 53 y.o. Comes to MAU with blood in her urine.  This happened previously in April 2019.  Lab results and chart reviewed from that visit.  Urine culture showed that cipro is effective and it was given for her.    Started having blood in her urine again.  Denies any pain with urination.  Is married and does not always urinate after having intercourse.  Has an appointment at [facility] tomorrow.  OB History    Gravida  3   Para  2   Term  2   Preterm      AB  1   Living  2     SAB      TAB  1   Ectopic      Multiple      Live Births              Past Medical History:  Diagnosis Date  . Anxiety   . Asthma   . Borderline personality disorder ([place])   . Contact dermatitis   . COPD (chronic obstructive pulmonary disease) ([place])   . Fibroid   . Infection    UTI  . Meniere disease   . Sciatica     Past Surgical History:  Procedure Laterality Date  . TOTAL ABDOMINAL HYSTERECTOMY  2007  . transvaginal hysterectomy  04/03/2006   Pathology revealed benign uterus and cervix    Family History  Problem Relation Age of Onset  . Leukemia Father   . COPD Mother   . Colon polyps Paternal Grandfather   . Anesthesia problems Neg Hx   . Hypotension Neg Hx   . Malignant hyperthermia Neg Hx   . Pseudochol deficiency Neg Hx   . Colon cancer Neg Hx   . Esophageal cancer Neg Hx   . Rectal cancer Neg Hx   . Stomach cancer Neg Hx     Social History   Tobacco Use  . Smoking status: Former Smoker    Types: Cigarettes    Start date: 10/23/1975    Last attempt to quit: 12/14/2007    Years since quitting: 10.8  . Smokeless tobacco: Never Used  Substance Use Topics  . Alcohol use: Yes    Alcohol/week: 0.0 standard drinks    Comment: occasionally  . Drug use: Yes    Frequency: 7.0 times  per week    Types: Marijuana    Comment: patient states that she uses marijuana everyday.  Last smoked this am 0900 am. maw    Allergies: No Known Allergies  Medications Prior to Admission  Medication Sig Dispense Refill Last Dose  . acetaminophen (TYLENOL) 325 MG tablet Take 650 mg by mouth every 6 (six) hours as needed.   Not Taking  . albuterol (PROAIR HFA) 108 (90 Base) MCG/ACT inhaler Inhale 2 puffs into the lungs every 6 (six) hours as needed for wheezing or shortness of breath. 8.5 g 2   . cyclobenzaprine (FLEXERIL) 10 MG tablet Take 1 tablet (10 mg total) by mouth 2 (two) times daily as needed for muscle spasms. (Patient not taking: Reported on 10/06/2018) 20 tablet 0 Not Taking  . fluticasone furoate-vilanterol (BREO ELLIPTA) 100-25 MCG/INH AEPB Inhale 1 puff into the lungs daily. 1 each 0   . hydrOXYzine (ATARAX/VISTARIL) 10 MG  tablet Take 10 mg by mouth 3 (three) times daily as needed for anxiety.   Not Taking  . ibuprofen (ADVIL,MOTRIN) 200 MG tablet Take 800 mg by mouth every 8 (eight) hours as needed for mild pain.    Taking  . mometasone-formoterol (DULERA) 200-5 MCG/ACT AERO Inhale 2 puffs into the lungs 2 (two) times daily. 13 g 11 Taking  . Multiple Vitamin (MULTIVITAMIN WITH MINERALS) TABS tablet Take 1 tablet by mouth daily.   Not Taking    Review of Systems  Constitutional: Negative for fever.  Gastrointestinal: Negative for abdominal pain, nausea and vomiting.  Genitourinary: Negative for dysuria.       Blood in urine  Musculoskeletal: Negative for back pain.   Physical Exam   Blood pressure 105/65, pulse 96, temperature 98 F (36.7 C), temperature source Oral, resp. rate 18, height 5\' 5"  (1.651 m), weight 78.7 kg.  Physical Exam  Nursing note and vitals reviewed. Constitutional: She is oriented to person, place, and time. She appears well-developed and well-nourished.  HENT:  Head: Normocephalic.  Eyes: EOM are normal.  Neck: Neck supple.  Musculoskeletal:  Normal range of motion.  Neurological: She is alert and oriented to person, place, and time.  Skin: Skin is warm and dry.  Psychiatric: She has a normal mood and affect.    MAU Course  Procedures Results for orders placed or performed during the hospital encounter of 11/02/18 (from the past 24 hour(s))  Urinalysis, Routine w reflex microscopic     Status: Abnormal   Collection Time: 11/02/18 10:28 AM  Result Value Ref Range   Color, Urine YELLOW YELLOW   APPearance CLOUDY (A) CLEAR   Specific Gravity, Urine 1.027 1.005 - 1.030   pH 5.0 5.0 - 8.0   Glucose, UA NEGATIVE NEGATIVE mg/dL   Hgb urine dipstick LARGE (A) NEGATIVE   Bilirubin Urine NEGATIVE NEGATIVE   Ketones, ur NEGATIVE NEGATIVE mg/dL   Protein, ur 100 (A) NEGATIVE mg/dL   Nitrite NEGATIVE NEGATIVE   Leukocytes, UA LARGE (A) NEGATIVE   RBC / HPF >50 (H) 0 - 5 RBC/hpf   WBC, UA >50 (H) 0 - 5 WBC/hpf   Bacteria, UA NONE SEEN NONE SEEN   Squamous Epithelial / LPF 6-10 0 - 5   WBC Clumps PRESENT    Mucus PRESENT   Wet prep, genital     Status: Abnormal   Collection Time: 11/02/18 11:09 AM  Result Value Ref Range   Yeast Wet Prep HPF POC NONE SEEN NONE SEEN   Trich, Wet Prep NONE SEEN NONE SEEN   Clue Cells Wet Prep HPF POC NONE SEEN NONE SEEN   WBC, Wet Prep HPF POC FEW (A) NONE SEEN   Sperm NONE SEEN     MDM Urine is the same as April 20109 when the urine culture showed an infection.  Will treat today based on the April urine culture. Advised to void after intercourse always.  Advised to keep appointment at [facility] tomorrow and let them know she was seen here today.  Advised she may need a urine culture one week after taking the antibiotics to make sure the UTI is resolved. Did self swabs for GC and Chlam. Discharged from Triage. Assessment and Plan  UTI  Plan Drink at least 8 8-oz glasses of water every day. Get your medicine at the pharmacy and take all as directed. See your primary care MD for  follow up as you have had this twice in one year. See  AVS for additional information given to client.  [place] Luther L Riyanshi [place] 11/02/2018, 11:12 AM

## 2018-11-03 ENCOUNTER — Encounter: Payer: Self-pay | Admitting: Family Medicine

## 2018-11-03 ENCOUNTER — Ambulatory Visit (INDEPENDENT_AMBULATORY_CARE_PROVIDER_SITE_OTHER): Payer: Self-pay | Admitting: Family Medicine

## 2018-11-03 ENCOUNTER — Other Ambulatory Visit: Payer: Self-pay

## 2018-11-03 VITALS — BP 110/62 | Temp 97.9°F | Ht 65.0 in | Wt 174.0 lb

## 2018-11-03 DIAGNOSIS — F411 Generalized anxiety disorder: Secondary | ICD-10-CM

## 2018-11-03 DIAGNOSIS — J449 Chronic obstructive pulmonary disease, unspecified: Secondary | ICD-10-CM

## 2018-11-03 DIAGNOSIS — F1210 Cannabis abuse, uncomplicated: Secondary | ICD-10-CM

## 2018-11-03 MED ORDER — FLUTICASONE FUROATE-VILANTEROL 100-25 MCG/INH IN AEPB: 1.0000 | INHALATION_SPRAY | Freq: Every day | RESPIRATORY_TRACT | 0 refills | Status: DC

## 2018-11-03 NOTE — Progress Notes (Signed)
   Subjective   Patient ID: Kristina Moreno    DOB: 12-19-1965, 53 y.o. female   MRN: [id number]  CC: "COPD"  HPI: Kristina Moreno is a 53 y.o. female who presents to clinic today for the following:  History of COPD: Patient with longstanding diagnosis of COPD here today for follow-up after undergoing PFTs which did not support the diagnosis (no prior PFT on file).  Her previous regimen included Dulera and albuterol.  Patient did report symptoms of anxiety which she feels was playing a role in her symptoms of shortness of breath.  This is resulted in her using her rescue inhaler on average 2 times per day.  She has no exacerbations on record since 2008.  She does report a history of crack cocaine use but none since 2009.  She currently smokes marijuana.  Patient has been using Breo as instructed since her last visit and denies wheeze, cough, chest pain.  Her shortness of breath is only during her feelings of anxiety which have been improving.  Anxiety: Patient reports intermittent episodes of feelings of anxiety with associated shortness of breath which she attributes to her marriage.  Patient states there is no physical violence.  She is currently using behavioral management with breathing techniques which seems to be improving.  ROS: see HPI for pertinent.  [place]: COPD, vertigo, HLD, type 1 bipolar disorder, chronic back pain with sciatica.  Surgical history TAH.  Family history COPD, leukemia (father). Smoking status reviewed. Medications reviewed.  Objective   BP 110/62   Temp 97.9 F (36.6 C) (Oral)   Ht 5\' 5"  (1.651 m)   Wt 174 lb (78.9 kg)   BMI 28.96 kg/m  Vitals and nursing note reviewed.  General: well nourished, well developed, NAD with non-toxic appearance HEENT: normocephalic, atraumatic, moist mucous membranes Cardiovascular: regular rate and rhythm without murmurs, rubs, or gallops Lungs: clear to auscultation bilaterally with normal work of breathing Abdomen: soft,  non-tender, non-distended, normoactive bowel sounds Skin: warm, dry, no rashes or lesions, cap refill < 2 seconds Extremities: warm and well perfused, normal tone, no edema Psych: euthymic mood, congruent affect, non-tremulous  Assessment & Plan   COPD (chronic obstructive pulmonary disease) (HCC) Based on PFT, does not meet criteria for COPD.  Symptoms of SOB likely related to anxiety attacks. - Continue de-escalation of therapy with Breo, may likely discontinue altogether at next visit  Cannabis abuse Current everyday user.  History of crack cocaine use in 2009.  Former tobacco user. - Discussed importance of cessation  Anxiety state Intermittent.  Seems to be related to her recent marriage as of March of last year.  Patient reports improvement with behavioral techniques.  PHQ 9 score 9, somewhat difficult. - Encouraged patient to continue breathing techniques and would avoid pharmacotherapy at this time  No orders of the defined types were placed in this encounter.  Meds ordered this encounter  Medications  . fluticasone furoate-vilanterol (BREO ELLIPTA) 100-25 MCG/INH AEPB    Sig: Inhale 1 puff into the lungs daily.    Dispense:  1 each    Refill:  0    Order Specific Question:   Lot Number?    Answer:   [id number]    Order Specific Question:   Expiration Date?    Answer:   11/22/2019    Order Specific Question:   Quantity    Answer:   1    Harriet Butte, [place], PGY-3 11/04/2018, 8:27 AM

## 2018-11-03 NOTE — Patient Instructions (Signed)
Thank you for coming in to see [place] today. Please see below to review our plan for today's visit.  1.  Continue the Breo daily.  We will discuss this at your next follow-up in approximately 1 month.  There is a chance that we will discontinue breathing medications altogether.  It is important that you avoid marijuana use or other inhaled substance which could damage her lungs. 2.  I am pleased to hear that you were working through your anxiety with the help of friends and behavioral therapy.  Continue doing this and follow-up with me at our next appointment.  Please call the clinic at [phone] if your symptoms worsen or you have any concerns. It was our pleasure to serve you.  Harriet Butte, [place], PGY-3

## 2018-11-04 DIAGNOSIS — F419 Anxiety disorder, unspecified: Secondary | ICD-10-CM | POA: Insufficient documentation

## 2018-11-04 DIAGNOSIS — F411 Generalized anxiety disorder: Secondary | ICD-10-CM | POA: Insufficient documentation

## 2018-11-04 LAB — GC/CHLAMYDIA PROBE AMP (~~LOC~~) NOT AT ARMC
Chlamydia: NEGATIVE
Neisseria Gonorrhea: NEGATIVE

## 2018-11-04 NOTE — Assessment & Plan Note (Signed)
Based on PFT, does not meet criteria for COPD.  Symptoms of SOB likely related to anxiety attacks. - Continue de-escalation of therapy with Breo, may likely discontinue altogether at next visit

## 2018-11-04 NOTE — Assessment & Plan Note (Signed)
Current everyday user.  History of crack cocaine use in 2009.  Former tobacco user. - Discussed importance of cessation

## 2018-11-04 NOTE — Assessment & Plan Note (Signed)
Intermittent.  Seems to be related to her recent marriage as of March of last year.  Patient reports improvement with behavioral techniques.  PHQ 9 score 9, somewhat difficult. - Encouraged patient to continue breathing techniques and would avoid pharmacotherapy at this time

## 2018-11-05 LAB — URINE CULTURE

## 2018-12-17 ENCOUNTER — Ambulatory Visit: Payer: Self-pay | Admitting: Family Medicine

## 2018-12-24 ENCOUNTER — Encounter: Payer: Self-pay | Admitting: Family Medicine

## 2018-12-24 ENCOUNTER — Other Ambulatory Visit: Payer: Self-pay

## 2018-12-24 ENCOUNTER — Ambulatory Visit (INDEPENDENT_AMBULATORY_CARE_PROVIDER_SITE_OTHER): Payer: Self-pay | Admitting: Family Medicine

## 2018-12-24 VITALS — BP 108/70 | HR 69 | Temp 98.0°F | Wt 172.0 lb

## 2018-12-24 DIAGNOSIS — F3162 Bipolar disorder, current episode mixed, moderate: Secondary | ICD-10-CM

## 2018-12-24 NOTE — Progress Notes (Deleted)
   Subjective   Patient ID: Kristina Moreno    DOB: 1966-01-16, 53 y.o. female   MRN: [id number]  CC: "***"  HPI: Kristina Moreno is a 53 y.o. female who presents to clinic today for the following:  ***: ***  ROS: see HPI for pertinent.  [place]: ***. Smoking status reviewed. Medications reviewed.  Objective   BP 108/70   Pulse 69   Temp 98 F (36.7 C) (Oral)   Wt 172 lb (78 kg)   SpO2 97%   BMI 28.62 kg/m  Vitals and nursing note reviewed.  General: well nourished, well developed, NAD with non-toxic appearance HEENT: normocephalic, atraumatic, moist mucous membranes Neck: supple, non-tender without lymphadenopathy Cardiovascular: regular rate and rhythm without murmurs, rubs, or gallops Lungs: clear to auscultation bilaterally with normal work of breathing Abdomen: soft, non-tender, non-distended, normoactive bowel sounds Skin: warm, dry, no rashes or lesions, cap refill < 2 seconds Extremities: warm and well perfused, normal tone, no edema  Assessment & Plan   No problem-specific Assessment & Plan notes found for this encounter.  No orders of the defined types were placed in this encounter.  No orders of the defined types were placed in this encounter.   Harriet Butte, [place], PGY-3 12/24/2018, 4:37 PM

## 2018-12-25 NOTE — Progress Notes (Signed)
Left without being seen.

## 2019-02-18 ENCOUNTER — Telehealth: Payer: Self-pay | Admitting: *Deleted

## 2019-02-18 NOTE — Telephone Encounter (Signed)
Pt calls because she wants to know if Dr. Yisroel Ramming thinks she should still be working with her "consitions and things".  She declines a televisit before I send a message to Dr. Yisroel Ramming to call her.  To PCP. Christen Bame, CMA

## 2019-02-19 NOTE — Telephone Encounter (Signed)
I am assuming this is "condition and things." She can schedule a telemedicine visit within the next 2-4 weeks, but she does not need an in-person visit at this time unless there is something acute. Please advise.  Harriet Butte, [place], PGY-3

## 2019-02-19 NOTE — Telephone Encounter (Signed)
Attempted to call pt. LVM for pt to call us back, and I also stated message left by Dr. Yisroel Ramming. Will try again later on today. Salvatore Marvel, CMA

## 2019-03-10 ENCOUNTER — Emergency Department (HOSPITAL_COMMUNITY): Payer: No Typology Code available for payment source

## 2019-03-10 ENCOUNTER — Encounter (HOSPITAL_COMMUNITY): Payer: Self-pay | Admitting: Emergency Medicine

## 2019-03-10 ENCOUNTER — Emergency Department (HOSPITAL_COMMUNITY)
Admission: EM | Admit: 2019-03-10 | Discharge: 2019-03-10 | Disposition: A | Discharge status: Home / Self Care | Payer: No Typology Code available for payment source | Attending: Emergency Medicine | Admitting: Emergency Medicine

## 2019-03-10 ENCOUNTER — Other Ambulatory Visit: Payer: Self-pay

## 2019-03-10 DIAGNOSIS — J449 Chronic obstructive pulmonary disease, unspecified: Secondary | ICD-10-CM | POA: Diagnosis not present

## 2019-03-10 DIAGNOSIS — J45909 Unspecified asthma, uncomplicated: Secondary | ICD-10-CM | POA: Diagnosis not present

## 2019-03-10 DIAGNOSIS — Z87891 Personal history of nicotine dependence: Secondary | ICD-10-CM | POA: Diagnosis not present

## 2019-03-10 DIAGNOSIS — R0781 Pleurodynia: Secondary | ICD-10-CM

## 2019-03-10 DIAGNOSIS — R0789 Other chest pain: Secondary | ICD-10-CM | POA: Insufficient documentation

## 2019-03-10 DIAGNOSIS — Z79899 Other long term (current) drug therapy: Secondary | ICD-10-CM | POA: Insufficient documentation

## 2019-03-10 MED ORDER — IBUPROFEN 200 MG PO TABS
600.0000 mg | ORAL_TABLET | Freq: Once | ORAL | Status: AC
  Administered 2019-03-10: 600 mg via ORAL
  Filled 2019-03-10: qty 3
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
## 2019-04-20 ENCOUNTER — Encounter (HOSPITAL_COMMUNITY): Payer: Self-pay

## 2019-04-20 ENCOUNTER — Ambulatory Visit (HOSPITAL_COMMUNITY)
Admission: EM | Admit: 2019-04-20 | Discharge: 2019-04-20 | Disposition: A | Discharge status: Home / Self Care | Payer: Self-pay | Attending: Family Medicine | Admitting: Family Medicine

## 2019-04-20 ENCOUNTER — Other Ambulatory Visit: Payer: Self-pay

## 2019-04-20 DIAGNOSIS — S00261A Insect bite (nonvenomous) of right eyelid and periocular area, initial encounter: Secondary | ICD-10-CM

## 2019-04-20 DIAGNOSIS — W57XXXA Bitten or stung by nonvenomous insect and other nonvenomous arthropods, initial encounter: Secondary | ICD-10-CM

## 2019-04-20 MED ORDER — METHYLPREDNISOLONE ACETATE 80 MG/ML IJ SUSP
80.0000 mg | Freq: Once | INTRAMUSCULAR | Status: AC
  Administered 2019-04-20: 80 mg via INTRAMUSCULAR

## 2019-04-20 MED ORDER — METHYLPREDNISOLONE ACETATE 80 MG/ML IJ SUSP
INTRAMUSCULAR | Status: AC
  Filled 2019-04-20: qty 1

## 2019-04-20 NOTE — ED Triage Notes (Signed)
Pt presents with swelling and redness on right eye from insect bite yesterday.  Pt has no complaints of pain or itchiness.

## 2019-04-20 NOTE — Discharge Instructions (Signed)
I have given you a shot of a prednisone medicine to help with the allergic reaction In addition you should take an antihistamine like Benadryl.  If this causes too much drowsiness use Zyrtec or Claritin (over-the-counter) Multiple insect bites.  You are allergic to the insect bite with redness and swelling.

## 2019-04-20 NOTE — ED Provider Notes (Signed)
[facility]    CSN: [id number] Arrival date & time: 04/20/19  1517      History   Chief Complaint Chief Complaint  Patient presents with  . Insect Bite    Right Eye    HPI Kristina Moreno is a 53 y.o. female.   HPI  Patient is here for multiple insect bites.  She has them on her chest back arms and face.  She has one on her right upper eyelid causing her eye to be swollen.  The itch.  She woke up with them.  She does not think that she got them outdoors.  She slept in a different bed, and is convinced that there are bedbugs.  Interestingly, the other person in the bed did not get any bites. She is allergic to mosquito bites.  Similar reaction.  No voice change, no swelling in mouth or throat, no shortness of breath  Past Medical History:  Diagnosis Date  . Anxiety   . Asthma   . Borderline personality disorder ([place])   . Contact dermatitis   . COPD (chronic obstructive pulmonary disease) ([place])   . Fibroid   . Infection    UTI  . Meniere disease   . Sciatica     Patient Active Problem List   Diagnosis Date Noted  . Anxiety state 11/04/2018  . Plantar wart, left foot 10/31/2016  . External hemorrhoids without complication 61/60/7371  . Ingrown nail 06/16/2016  . Moderate mixed bipolar I disorder ([place]) 04/23/2016  . Hyperlipidemia 02/06/2016  . Left-sided low back pain with sciatica 12/30/2015  . Meniere disease 10/30/2012  . Cannabis abuse 05/15/2007    Past Surgical History:  Procedure Laterality Date  . TOTAL ABDOMINAL HYSTERECTOMY  2007  . transvaginal hysterectomy  04/03/2006   Pathology revealed benign uterus and cervix    OB History    Gravida  3   Para  2   Term  2   Preterm      AB  1   Living  2     SAB      TAB  1   Ectopic      Multiple      Live Births               Home Medications    Prior to Admission medications   Medication Sig Start Date End Date Taking? Authorizing Provider  acetaminophen (TYLENOL)  325 MG tablet Take 650 mg by mouth every 6 (six) hours as needed.    [provider]  albuterol (PROAIR HFA) 108 (90 Base) MCG/ACT inhaler Inhale 2 puffs into the lungs every 6 (six) hours as needed for wheezing or shortness of breath. 10/06/18   Zenia Resides, MD  fluticasone furoate-vilanterol (BREO ELLIPTA) 100-25 MCG/INH AEPB Inhale 1 puff into the lungs daily. 11/03/18   Payson Bing, DO  hydrOXYzine (ATARAX/VISTARIL) 10 MG tablet Take 10 mg by mouth 3 (three) times daily as needed for anxiety.    [provider]  ibuprofen (ADVIL,MOTRIN) 200 MG tablet Take 800 mg by mouth every 8 (eight) hours as needed for mild pain.     [provider]  mometasone-formoterol (DULERA) 200-5 MCG/ACT AERO Inhale 2 puffs into the lungs 2 (two) times daily. 12/30/15   Karamalegos, Devonne Doughty, DO  Multiple Vitamin (MULTIVITAMIN WITH MINERALS) TABS tablet Take 1 tablet by mouth daily.    [provider]    Family History Family History  Problem Relation Age of  Onset  . Leukemia Father   . COPD Mother   . Colon polyps Paternal Grandfather   . Anesthesia problems Neg Hx   . Hypotension Neg Hx   . Malignant hyperthermia Neg Hx   . Pseudochol deficiency Neg Hx   . Colon cancer Neg Hx   . Esophageal cancer Neg Hx   . Rectal cancer Neg Hx   . Stomach cancer Neg Hx     Social History Social History   Tobacco Use  . Smoking status: Former Smoker    Types: Cigarettes    Start date: 10/23/1975    Quit date: 12/14/2007    Years since quitting: 11.3  . Smokeless tobacco: Never Used  Substance Use Topics  . Alcohol use: Yes    Alcohol/week: 0.0 standard drinks    Comment: occasionally  . Drug use: Yes    Frequency: 7.0 times per week    Types: Marijuana    Comment: patient states that she uses marijuana everyday.  Last smoked this am 0900 am. maw     Allergies   Patient has no known allergies.   Review of Systems Review of Systems  Constitutional:  Negative for chills and fever.  HENT: Negative for ear pain and sore throat.   Eyes: Negative for pain and visual disturbance.  Respiratory: Negative for cough and shortness of breath.   Cardiovascular: Negative for chest pain and palpitations.  Gastrointestinal: Negative for abdominal pain and vomiting.  Genitourinary: Negative for dysuria and hematuria.  Musculoskeletal: Negative for arthralgias and back pain.  Skin: Positive for wound. Negative for color change and rash.  Neurological: Negative for seizures and syncope.  All other systems reviewed and are negative.    Physical Exam Triage Vital Signs ED Triage Vitals  Enc Vitals Group     BP 04/20/19 0958 116/72     Pulse Rate 04/20/19 0958 67     Resp 04/20/19 0958 18     Temp 04/20/19 0958 98.4 F (36.9 C)     Temp Source 04/20/19 0958 Oral     SpO2 04/20/19 0958 100 %   No data found.  Updated Vital Signs BP 116/72 (BP Location: Left Arm)   Pulse 67   Temp 98.4 F (36.9 C) (Oral)   Resp 18   SpO2 100%      Physical Exam Constitutional:      General: She is not in acute distress.    Appearance: She is well-developed.  HENT:     Head: Normocephalic and atraumatic.  Eyes:     Conjunctiva/sclera: Conjunctivae normal.     Pupils: Pupils are equal, round, and reactive to light.     Comments: Right eyelid is nearly swollen shut.  Upper lid is erythematous and puffy.  Multiple bites on both shoulders, chest and back noted few on legs.  They are all from 1 cm to 3 cm across, erythematous.  Neck:     Musculoskeletal: Normal range of motion.  Cardiovascular:     Rate and Rhythm: Normal rate.  Pulmonary:     Effort: Pulmonary effort is normal. No respiratory distress.  Abdominal:     General: There is no distension.     Palpations: Abdomen is soft.  Musculoskeletal: Normal range of motion.  Skin:    General: Skin is warm and dry.  Neurological:     Mental Status: She is alert.      UC Treatments / Results   Labs (all labs ordered are listed, but only abnormal  results are displayed) Labs Reviewed - No data to display  EKG None  Radiology No results found.  Procedures Procedures (including critical care time)  Medications Ordered in UC Medications  methylPREDNISolone acetate (DEPO-MEDROL) injection 80 mg (80 mg Intramuscular Given 04/20/19 1055)  methylPREDNISolone acetate (DEPO-MEDROL) 80 MG/ML injection (has no administration in time range)    Initial Impression / Assessment and Plan / UC Course  I have reviewed the triage vital signs and the nursing notes.  Pertinent labs & imaging results that were available during my care of the patient were reviewed by me and considered in my medical decision making (see chart for details).    Patient wanted me to write a statement saying that she had bedbugs.  I told her that I cannot identify what insect caused her bites.  Final Clinical Impressions(s) / UC Diagnoses   Final diagnoses:  Insect bite of right eyelid, initial encounter  Insect bite, unspecified site, initial encounter     Discharge Instructions     I have given you a shot of a prednisone medicine to help with the allergic reaction In addition you should take an antihistamine like Benadryl.  If this causes too much drowsiness use Zyrtec or Claritin (over-the-counter) Multiple insect bites.  You are allergic to the insect bite with redness and swelling.   ED Prescriptions    None     Controlled Substance Prescriptions [place] Controlled Substance Registry consulted? n   Raylene Everts, MD 04/20/19 1324

## 2019-05-25 ENCOUNTER — Ambulatory Visit (INDEPENDENT_AMBULATORY_CARE_PROVIDER_SITE_OTHER): Payer: Self-pay | Admitting: Family Medicine

## 2019-05-25 ENCOUNTER — Encounter: Payer: Self-pay | Admitting: Family Medicine

## 2019-05-25 ENCOUNTER — Other Ambulatory Visit: Payer: Self-pay

## 2019-05-25 VITALS — BP 102/60 | HR 82

## 2019-05-25 DIAGNOSIS — H6983 Other specified disorders of Eustachian tube, bilateral: Secondary | ICD-10-CM

## 2019-05-25 DIAGNOSIS — H9203 Otalgia, bilateral: Secondary | ICD-10-CM | POA: Insufficient documentation

## 2019-05-25 DIAGNOSIS — J302 Other seasonal allergic rhinitis: Secondary | ICD-10-CM

## 2019-05-25 MED ORDER — FLUTICASONE PROPIONATE 50 MCG/ACT NA SUSP: 2.0000 | Freq: Every day | NASAL | 6 refills | Status: DC

## 2019-05-25 NOTE — Assessment & Plan Note (Addendum)
History and physical exam appear more consistent with eustachian tube dysfunction from seasonal allergies resulting in intermittent bilateral otaligia. Especially when she describes the popping sensation, postnasal drip and congestion. Although she has a history of Meniere's disease, clinical symptoms do not support this diagnosis. Discussed treatment options with patient including treating seasonal allergies at this time, if no improvement then can do trial of HCTZ. Patient was agreeable to this plan. - Flonase and OTC oral 2nd generation antihistamine - Patient to call clinic if no improvement in 1-2 weeks and can then discuss trial of low dose HCTZ - Must use caution with HCTZ as patient was noted to have soft blood pressures during exam  - RTC if worsening or no improvement

## 2019-05-25 NOTE — Patient Instructions (Signed)
Thank you for coming in to see me today. I think some of your ear pain may be stemming from your allergies. I would like to see if treating that improves your symptoms. I have called in a nasal spray called Flonase. And I recommend picking up a oral antihistamine (non-drowsy) to be taken every day. Let's try this for 2-3 weeks. If no improvement, call the clinic and I will call in the HCTZ medication.   Oral Antihistamines (non-drowsy): Cetirizine (Zyrtec) Loratidine (Claritin) Fexofenadine (Allegra)

## 2019-05-25 NOTE — Progress Notes (Signed)
   Subjective:   Patient ID: Kristina Moreno    DOB: Oct 21, 1966, 53 y.o. female   MRN: [id number]  Kristina Moreno is a 53 y.o. female here for "Pain in her ears"  Bilateral Ear Pain: Patient notes she has a history of Meniere's disease. Diagnosed at [facility]. Has treated in past with HCTZ 12.5mg  which provided improvement. She notes the pain returned 1 month ago. About 1-2x per week she will have a "pop" and then a throbbing pain which only lasts for seconds then go away. She notes it is becoming more frequent and stronger. Denies hearing impairment. Denies any tinnitus, vertigo, or dizziness. Denies fevers or chills. She notes she has had vertigo in the past and avoids the triggers. Does note allegies with constant congestion and feeling of postnasal drip.  Review of Systems:  Per HPI.   [place], medications and smoking status reviewed.  Objective:   BP 102/60   Pulse 82   SpO2 97%  Vitals and nursing note reviewed.  General: well nourished, well developed, in no acute distress with non-toxic appearance, sitting comfortably on exam bed HEENT: normocephalic, atraumatic, moist mucous membranes, external ear canals clear without cerumen or erythema bilaterally, normal appearing TM, minimal fluid behind TM bilaterally, no pain or erythema to external ear, nontender to palpation at mastoid bilaterally Neck: supple, normal ROM CV: regular rate and rhythm without murmurs, rubs, or gallops Lungs: clear to auscultation bilaterally with normal work of breathing Skin: warm, dry Extremities: warm and well perfused  Assessment & Plan:   Otalgia of both ears History and physical exam appear more consistent with eustachian tube dysfunction from seasonal allergies resulting in intermittent bilateral otaligia. Especially when she describes the popping sensation, postnasal drip and congestion. Although she has a history of Meniere's disease, clinical symptoms do not support this  diagnosis. Discussed treatment options with patient including treating seasonal allergies at this time, if no improvement then can do trial of HCTZ. Patient was agreeable to this plan. - Flonase and OTC oral 2nd generation antihistamine - Patient to call clinic if no improvement in 1-2 weeks and can then discuss trial of low dose HCTZ - Must use caution with HCTZ as patient was noted to have soft blood pressures during exam  - RTC if worsening or no improvement  No orders of the defined types were placed in this encounter.  Meds ordered this encounter  Medications  . fluticasone (FLONASE) 50 MCG/ACT nasal spray    Sig: Place 2 sprays into both nostrils daily.    Dispense:  16 g    Refill:  [place], DO PGY-2, [facility] 05/25/2019 10:04 PM

## 2019-07-27 ENCOUNTER — Other Ambulatory Visit: Payer: Self-pay

## 2019-07-27 ENCOUNTER — Ambulatory Visit (INDEPENDENT_AMBULATORY_CARE_PROVIDER_SITE_OTHER): Payer: Self-pay | Admitting: *Deleted

## 2019-07-27 DIAGNOSIS — Z23 Encounter for immunization: Secondary | ICD-10-CM

## 2019-08-17 ENCOUNTER — Other Ambulatory Visit: Payer: Self-pay

## 2019-08-17 DIAGNOSIS — Z20822 Contact with and (suspected) exposure to covid-19: Secondary | ICD-10-CM

## 2019-08-18 LAB — NOVEL CORONAVIRUS, NAA: SARS-CoV-2, NAA: NOT DETECTED

## 2019-08-24 ENCOUNTER — Telehealth: Payer: Self-pay | Admitting: *Deleted

## 2019-08-24 NOTE — Telephone Encounter (Signed)
Pt is requesting a letter for her job about her Covid results .  Created and given to patient.  Christen Bame, CMA

## 2019-10-05 ENCOUNTER — Other Ambulatory Visit: Payer: Self-pay

## 2019-10-05 ENCOUNTER — Encounter: Payer: Self-pay | Admitting: Family Medicine

## 2019-10-05 ENCOUNTER — Ambulatory Visit (INDEPENDENT_AMBULATORY_CARE_PROVIDER_SITE_OTHER): Payer: Self-pay | Admitting: Family Medicine

## 2019-10-05 VITALS — BP 99/60 | HR 99 | Wt 172.2 lb

## 2019-10-05 DIAGNOSIS — F339 Major depressive disorder, recurrent, unspecified: Secondary | ICD-10-CM | POA: Insufficient documentation

## 2019-10-05 DIAGNOSIS — Z0001 Encounter for general adult medical examination with abnormal findings: Secondary | ICD-10-CM

## 2019-10-05 DIAGNOSIS — Z1231 Encounter for screening mammogram for malignant neoplasm of breast: Secondary | ICD-10-CM

## 2019-10-05 DIAGNOSIS — F319 Bipolar disorder, unspecified: Secondary | ICD-10-CM

## 2019-10-05 DIAGNOSIS — Z23 Encounter for immunization: Secondary | ICD-10-CM

## 2019-10-05 MED ORDER — QUETIAPINE FUMARATE 100 MG PO TABS: 100.0000 mg | ORAL_TABLET | Freq: Every day | ORAL | 1 refills | Status: DC

## 2019-10-05 NOTE — Patient Instructions (Addendum)
Please call to schedule your mammogram at your earliest convenience!  Please start taking Seroquel 100mg  every night before bed. We will plan to follow up together on 11/05/18 at 8:30am for follow up visit. I look forward to see you then. Please be sure to contact one of the below psychiatrists to establish care. The emergency line at the bottom is if you ever are in a crisis and need help immediately.   Psychiatry Resources:  Outpatient Mental Health Providers - No Insurance at time of Visit or Self Pay  [facility] Mon-Fri, 8:30-5:00  [address] [place], [place] [address] 3 [phone]  RunningConvention.de [phone] (Immediate assistance)  [facility]  Walk-in Mon-Fri, 8am-3pm  [address] [place], [place]  www.rhahealthservices.org  [facility] [place] (Habla Espanol)  walk in M-F 8am-12pm and 1pm-3pm [place] Phone: [phone]  [facility] ([place] and substance challenges)  [address], [place]  [phone]  [email or web address]  [facility] [place], [place] Phone: [phone] [place] [phone]  [facility]  Walk-in MWF 12:30 to 3:00  [place] [address]  [phone]  www.ADSyes.org call to schedule an appointment  [place] ,Support group, Peer support services [address], [place], [place] [address]  336- [phone]  [email or web address].[email or web address]  National Alliance on Mental Illness (NAMI) [place]- Wellness classes, Support groups  [address] [place], [place] [address]  [phone]  CurrentJokes.cz   [facility] (Psycho-social Rehabilitation clubhouse, Individual and group therapy) [address], [place] [address]  336- [phone]  24- Hour Availability: *[place] or [phone] * [facility] [employer] (Domestic Violence, Rape, etc. )[phone] [facility] Sessions [phone] or [phone] * [place] [phone] only) [phone] (after hours) *[facility] Mobile Crisis Unit [phone] *[place] National Suicide Hotline [phone] Diamantina Monks)
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
## 2019-10-07 ENCOUNTER — Telehealth: Payer: Self-pay | Admitting: *Deleted

## 2019-10-07 DIAGNOSIS — F319 Bipolar disorder, unspecified: Secondary | ICD-10-CM

## 2019-10-07 NOTE — Telephone Encounter (Signed)
Pt calls because Seroquel is going to cost her $77/month and she cannot afford that.  She also tried goodRx but states that the price was actually more with it.   To PCP to advise.   Christen Bame, CMA

## 2019-10-07 NOTE — Telephone Encounter (Signed)
Attempted to call patient back several times without luck. Appears patient is able to get RX with [facility] for $9 at [facility] or [facility]15 from [facility]. I can send rx to either of these pharmacies if patient would like. Awaiting call back.

## 2019-10-07 NOTE — Telephone Encounter (Signed)
Pt would like to try [facility] on [address]. Christen Bame, CMA

## 2019-10-08 MED ORDER — QUETIAPINE FUMARATE 100 MG PO TABS: 100.0000 mg | ORAL_TABLET | Freq: Every day | ORAL | 1 refills | Status: DC

## 2019-10-08 NOTE — Telephone Encounter (Signed)
Order placed to [facility]. Thank you!

## 2019-11-06 ENCOUNTER — Telehealth (INDEPENDENT_AMBULATORY_CARE_PROVIDER_SITE_OTHER): Payer: Self-pay | Admitting: Family Medicine

## 2019-11-06 ENCOUNTER — Other Ambulatory Visit: Payer: Self-pay

## 2019-11-06 DIAGNOSIS — F319 Bipolar disorder, unspecified: Secondary | ICD-10-CM

## 2019-11-06 MED ORDER — CITALOPRAM HYDROBROMIDE 20 MG PO TABS: 20.0000 mg | ORAL_TABLET | Freq: Every day | ORAL | 3 refills | Status: DC

## 2019-11-06 NOTE — Assessment & Plan Note (Addendum)
Much improved with Seroquel 100mg . PHQ-9 score improved from 18 to 8 today. Still having some depressive symptoms that I feel may benefit from augmenting with an antidepressant. Will trial Celexa 20mg  QD. Follow up in 1 month via virtual appointment. Patient to continue to walk daily as this provides her an outlet of her anxiety and stress. Reminded of 24-hr hotline number in cases of emergency crisis.

## 2019-11-06 NOTE — Progress Notes (Signed)
[facility] Tele[facility] Visit  Patient consented to have virtual visit. Method of visit: Video was attempted, but technology challenges prevented patient from using video, so visit was conducted via telephone.  Encounter participants: Patient: Kristina Moreno - located at [facility] Provider: Danna Hefty - located at [facility] Others (if applicable): None  Chief Complaint: Bipolar Disorder/Depression Followup  HPI: Patient is following up for bipolar disorder and depression. She was started on Seroquel 100mg  qHS. She notes that her energy is a little better and sleeping a little better. Does not feel as anxious. She has been walking during lunch daily and feels like she has more energy. PHQ-8. Notes that she does have some depressive type symptoms. She notes she has occasional passive thoughts of suicide with no plan. She just states an occasional thought of "I wonder what would happen if I would just go off the road or drive off the bridge". She likes her current dose of Seroquel but is open to starting an antidepressant.   ROS: per HPI  Pertinent PMHx: Bipolar 1 disorder, anxiety  Exam:  Respiratory: Speaking in full sentences, breathing comfortably   Assessment/Plan:  Bipolar I disorder with depression ([place]) Much improved with Seroquel 100mg . PHQ-9 score improved from 18 to 8 today. Still having some depressive symptoms that I feel may benefit from augmenting with an antidepressant. Will trial Celexa 20mg  QD. Follow up in 1 month via virtual appointment. Patient to continue to walk daily as this provides her an outlet of her anxiety and stress. Reminded of 24-hr hotline number in cases of emergency crisis.    Time spent during visit with patient: 21 minutes  Mina Marble, [place], PGY2 11/06/19

## 2019-11-23 NOTE — Progress Notes (Signed)
   CHIEF COMPLAINT / HPI:  Urinary Tract Infection: Patient complains of pelvic pressure consistent with previous UTIs.  She has had symptoms for 1 week. Patient denies back pain, fever and frequency, urgency, dysuria. Patient does have a history of recurrent UTI, this feels exactly like her previous.  Patient does not have a history of pyelonephritis.    Elevated PHQ2 Patient with elevated PHQ 2.  PHQ-9 does appear to be improved since December.  Patient states that she sees [facility] psychiatry.  Is somewhat noncompliant on her medications.  Has a history of bipolar disorder and knows she needs to follow-up.  I discussed this with her and she will follow-up with them.  Also asked if there are any other psychiatrist in the area.  AVS with information on psychiatrist in the area that accept Medicaid were given.  Patient does not have health insurance so will call to see if they will accept her.  No SI/HI. Depression screen [facility] 2/9 11/24/2019 10/05/2019 05/25/2019 12/24/2018 01/17/2018  Decreased Interest 1 3 0 0 0  Down, Depressed, Hopeless 1 3 0 0 0  PHQ - 2 Score 2 6 0 0 0  Altered sleeping 1 1 - - -  Tired, decreased energy 1 2 - - -  Change in appetite 1 0 - - -  Feeling bad or failure about yourself  1 3 - - -  Trouble concentrating 1 3 - - -  Moving slowly or fidgety/restless 2 3 - - -  Suicidal thoughts 0 0 - - -  PHQ-9 Score 9 18 - - -     PERTINENT  PMH / PSH: bipolar disorder   OBJECTIVE: BP 122/72   Pulse 83   Wt 173 lb (78.5 kg)   SpO2 99%   BMI 28.79 kg/m   Gen: awake and alert, NAD Cardio: RRR, no MRG Resp: CTAB, no increased WOB  ASSESSMENT / PLAN:  Pelvic pressure in female Patient with pelvic pressure.  States that this is consistent with a urinary tract infection.  UA was negative.  Will obtain urine culture.  If this is positive will prescribe antibiotics.  Also if urine culture is negative can consider suprapubic pressure.  Follow-up if no improvement.  Bipolar I  disorder with depression ([place]) Follows with [facility].  I advised that she continue to follow-up with him.  List of psychiatrists in the area also given to patient.  No SI or HI at this time.     Kristina More, DO  PGY-3 [place]

## 2019-11-24 ENCOUNTER — Ambulatory Visit (INDEPENDENT_AMBULATORY_CARE_PROVIDER_SITE_OTHER): Payer: Self-pay | Admitting: Family Medicine

## 2019-11-24 ENCOUNTER — Other Ambulatory Visit: Payer: Self-pay

## 2019-11-24 VITALS — BP 122/72 | HR 83 | Wt 173.0 lb

## 2019-11-24 DIAGNOSIS — F319 Bipolar disorder, unspecified: Secondary | ICD-10-CM

## 2019-11-24 DIAGNOSIS — R3989 Other symptoms and signs involving the genitourinary system: Secondary | ICD-10-CM

## 2019-11-24 DIAGNOSIS — R102 Pelvic and perineal pain: Secondary | ICD-10-CM | POA: Insufficient documentation

## 2019-11-24 LAB — POCT URINALYSIS DIP (MANUAL ENTRY)
Bilirubin, UA: NEGATIVE
Blood, UA: NEGATIVE
Glucose, UA: NEGATIVE mg/dL
Ketones, POC UA: NEGATIVE mg/dL
Leukocytes, UA: NEGATIVE
Nitrite, UA: NEGATIVE
Protein Ur, POC: NEGATIVE mg/dL
Spec Grav, UA: >=1.03 — AB (ref 1.010–1.025)
Urobilinogen, UA: 0.2 E.U./dL
pH, UA: 6 (ref 5.0–8.0)

## 2019-11-24 NOTE — Assessment & Plan Note (Signed)
Follows with [facility].  I advised that she continue to follow-up with him.  List of psychiatrists in the area also given to patient.  No SI or HI at this time.

## 2019-11-24 NOTE — Assessment & Plan Note (Signed)
Patient with pelvic pressure.  States that this is consistent with a urinary tract infection.  UA was negative.  Will obtain urine culture.  If this is positive will prescribe antibiotics.  Also if urine culture is negative can consider suprapubic pressure.  Follow-up if no improvement.

## 2019-11-24 NOTE — Patient Instructions (Signed)
It was a pleasure seeing you today.   Today we discussed your urine and bipolar disorder  For your urine: I have ordered a culture, if positive I will send in antibiotics  For your bipolar disorder: I have attached the psychiatrists in the area. Please call to see if they will take no insurance. Otherwise follow up with [facility] as you may need medication adjustments.   Please follow up in 2 weeks if no improvements in urine or sooner if symptoms persist or worsen. Please call the clinic immediately if you have any concerns.   Our clinic's number is [phone]. Please call with questions or concerns.   Please go to the emergency room if you have thoughts of harming yourself or others   Thank you,  Caroline More, DO  Psychiatry Resource List (Adults and Children) Most of these providers will take Medicaid. please consult your insurance for a complete and updated list of available providers. When calling to make an appointment have your insurance information available to confirm you are covered.  [place]:   [place]: [address]     [phone] [place]: [place],        [phone] [place]: [address] [place],    [place]: [address] [place],                   [place]  (Psychiatry & counseling ; adults & children ; will take Medicaid) [place], [place], [place]        [phone]   [place]  (Psychiatry only; Adults only, will take Medicaid)  [place], [place], [place] [address]       [phone]   [place] (Psychiatry & counseling ; adults & children ; will take Medicaid [address]  [place] [address]   [phone]    ([place] therapist)  [facility]  Psychiatry & counseling; Adults and children;  [place] [address]    [place], [place] [address]     [phone]  [facility] (Psychiatry & counseling; adults & children; Medicare no Medicaid)  [place], [place]  [address]      [phone]    [facility] (up to age 75)  Psychiatry & counseling ,will take Medicaid, must do counseling to receive psychiatry services  [address] [place] [address]        ([place] (Psychiatry & counseling; adults & children; will take Medicaid) [address],  [place], [place]  [phone]  [facility]---  Walk-in Mon-Fri, 8:30-5:00 (will take Medicaid)  [address], [place], [place]  [phone]    [facility] --- Walk-In Mon-Friday 8am-3pm ( will take Medicaid, Psychiatry, Adults & children,  [address], [place], [place]   [phone]   [facility]--, Walk-in M-F 8am-12pm and 1pm -3pm   (Counseling, Psychiatry, will take Medicaid, adults & children)  [address] [place], [place]  [phone]

## 2019-11-26 ENCOUNTER — Telehealth: Payer: Self-pay

## 2019-11-26 LAB — URINE CULTURE: Organism ID, Bacteria: NO GROWTH

## 2019-11-26 NOTE — Telephone Encounter (Signed)
Informed patient of results.  .Kristina Moreno, CMA  

## 2019-12-09 ENCOUNTER — Telehealth: Payer: Self-pay | Admitting: Family Medicine
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
## 2020-08-11 ENCOUNTER — Telehealth: Payer: Self-pay | Admitting: Family Medicine

## 2020-08-11 NOTE — Telephone Encounter (Signed)
Patient calling stating she cut herself on a rusted piece of metal and wants to know if she can get a tetanus shot at her nurse visit on October 26th with her flu shot.   Last tetanus was in 2020.

## 2020-08-14 NOTE — Telephone Encounter (Signed)
Attempted to contact patient to discuss her concerns without success. Please inform her that if wound looks red, inflamed, warm, bleeding or has drainage, develops fever/chills she should have be evaluated. If the piece of metal caused a deep puncture, I would also advice evaluation.  Regarding her tetanus vaccine. Since it has been <5 years since her last tetanus immunization, she does not require a booster shot. Her last vaccine is still adequate protection.

## 2020-08-15 NOTE — Telephone Encounter (Signed)
LMOVM informing pt to call us back. See message below. Galaxy Borden Kennon Holter, CMA

## 2020-08-16 ENCOUNTER — Ambulatory Visit (INDEPENDENT_AMBULATORY_CARE_PROVIDER_SITE_OTHER): Payer: Self-pay

## 2020-08-16 ENCOUNTER — Other Ambulatory Visit: Payer: Self-pay

## 2020-08-16 DIAGNOSIS — Z23 Encounter for immunization: Secondary | ICD-10-CM

## 2020-08-16 NOTE — Progress Notes (Signed)
Patient presents in Flu Clinic for Flu Vaccine.   Vaccine administered LD without complication.   See admin for details.

## 2020-11-11 ENCOUNTER — Ambulatory Visit (INDEPENDENT_AMBULATORY_CARE_PROVIDER_SITE_OTHER): Payer: Self-pay | Admitting: Family Medicine

## 2020-11-11 ENCOUNTER — Other Ambulatory Visit: Payer: Self-pay

## 2020-11-11 DIAGNOSIS — G4700 Insomnia, unspecified: Secondary | ICD-10-CM | POA: Insufficient documentation

## 2020-11-11 DIAGNOSIS — F5105 Insomnia due to other mental disorder: Secondary | ICD-10-CM

## 2020-11-11 DIAGNOSIS — F99 Mental disorder, not otherwise specified: Secondary | ICD-10-CM

## 2020-11-11 DIAGNOSIS — F319 Bipolar disorder, unspecified: Secondary | ICD-10-CM

## 2020-11-11 MED ORDER — QUETIAPINE FUMARATE 100 MG PO TABS: 100.0000 mg | ORAL_TABLET | Freq: Every day | ORAL | 1 refills | Status: DC

## 2020-11-11 NOTE — Progress Notes (Signed)
    SUBJECTIVE:   CHIEF COMPLAINT / HPI:   Insomnia for 2 weeks Patient reports that she has not slept for 2 weeks.  She feels like her mind is racing and she wants to sleep but is just unable to.  She misses sleep and says she lays in bed for 12 h at a time.  Has diagnosis of bipolar disorder as well as multiple personality disorder and was on Seroquel but has not been able to pick it up so has not been taking it.  Denies any auditory hallucinations and reports that she sometimes sees a little "flickering" out of the corner of her right eye but it is not all the time.  Denies any SI or HI.  Is just extremely frustrated because she wants to sleep.  Reports that she lays in her bed with the lights off and her eyes closed.  She was drinking energy drinks but has not drank those for 1 week.  Denies any other substance abuse or use other than 2 blunts of marijuana a day.  Patient reports that she knows she needs to go see Beverly Sessions but was not extremely pleased with her care so is looking for other psychiatric resources.   OBJECTIVE:   BP 110/75   Pulse 76   Ht 5\' 5"  (1.651 m)   Wt 159 lb 6.4 oz (72.3 kg)   SpO2 99%   BMI 26.53 kg/m   General: Tired appearing, intermittently tearful HEENT: Extraocular eye movements intact, pupils equal and reactive to light, no abnormalities noted, discussed a vision test but patient reports she wears glasses and did not bring them. Cardiac: Regular rate and rhythm, no murmurs appreciated Respiratory: Normal work of breathing, lungs clear to auscultation bilaterally Abdomen: Soft, nontender, positive bowel sounds Psych: Intermittently down mood, denies any auditory or visual hallucinations at this time.  Denies any SI or HI.  Reports that she wants to start back on her medicines and needs to see her psychiatrist.  [facility] SCORE ONLY 11/11/2020 11/11/2020 11/24/2019  PHQ-9 Total Score 13 0 9     ASSESSMENT/PLAN:   Insomnia Patient reports that she has slept only  a few minutes over the last 2 weeks.  Lays in bed for 12 hours reports that her mind is racing.  Was on medications for bipolar disorder including Seroquel and Celexa but has stopped taking them.  Denies any SI or HI at this time.  Denies any auditory visual hallucinations. - Restarted patient's Seroquel at this time, prescription sent to [facility] which she requested - Recommended patient follow-up with her psychiatric provider regarding other medications and treatment that she may need and patient is agreeable to this - Provided patient with resource information on psychiatric providers to accept Medicaid or no insurance - Strict ED precautions given - Suicide precautions discussed and patient is agreeable to these.     Gifford Shave, MD [place]

## 2020-11-11 NOTE — Patient Instructions (Signed)
It was wonderful seeing you today.  I am sorry you are having these issues with sleep.  I have written a prescription for your Seroquel and sent to the pharmacy at [facility] on [address] which is the one off of [address].  This should help you with some sleep.  I highly recommend you follow-up with [facility] or another psychiatric provider.  Below is some psychiatric resources in the area for people without insurance.  I hope you have a wonderful afternoon and get some sleep!   Outpatient Mental Health Providers (No Insurance required or Self Pay)  [facility]  [address] [place], [place] Crisis [phone]  [facility]) can see uninsured folks for outpatient therapy [email or web address] [address] [place], [place] [address] [phone]  [place] Mon-Fri, 8am-3pm [email or web address], [place], [place]   [place] [phone] [place] [facility] for psych med management, there may be a wait- if [facility] is working with clients for OPT, they will coordinate with [place] for [place]   Walk-in-Clinic: Monday- Friday 9:00 AM - 4:00 PM [place], [place] ([phone]) [phone]  [facility] [place] ([employer]) walk in M-F 8am-12pm and  1pm-3pm [place]  [place]  Phone: [phone]  [employer] ([place] and substance challenges) [address], [place] [phone]    [email or web address], [place]     Phone:  [phone] [place]  [place]  [place]  [phone] [email or web address]   [facility] (  virtual or zoom therapy) [email or web address]  [place]  [place]    [facility] [phone]  grief counseling, dementia and caregiver support    [facility] Walk-in MWF 12:30 to 3:00     [place] [address]  [phone]  [email or web address].[email or web address] call to schedule an appointment    [place] ,Support group, Peer support services, [address] [place], [place] [address] 336- [phone]  [email or web address]           National Alliance on Mental Illness (NAMI) [place]- Wellness classes, Support groups        [address] [place], [place] [address] [phone]   CurrentJokes.cz   [facility]  (Psycho-social Rehabilitation clubhouse, Individual and group therapy) [address],  [address]   [phone]  24- Hour Availability:  *[place] or [phone] * [facility] [employer] (Domestic Violence, Rape, etc. )[phone] Beverly Sessions [phone] or [phone] * [facility] [place] [phone] (after hours) *[facility] Mobile Crisis Unit [phone] *[place] National Suicide Hotline [phone] Diamantina Monks)      If you are feeling suicidal or depression symptoms worsen please immediately go to:   [place]  [address] [place], [place] Crisis [phone]    . If you are thinking about harming yourself or having thoughts of suicide, or if you know someone who is, seek help right away. . Call your doctor or mental  health care provider. . Call 911 or go to a hospital emergency room to get immediate help, or ask a friend or family member to help you do these things. . Call the [place] National Suicide Prevention Lifeline's toll-free, 24-hour hotline at 1-800-273-TALK 972-681-5192) or TTY: 1-800-799-4 TTY  507-644-3443) to talk to a trained counselor. . If you are in crisis, make sure you are not left alone.  . If someone else is in crisis, make sure he or she is not left alone   [facility] [employer] (Domestic Violence, Rape & Victim Assistance [phone]  [facility] [place]    (ONLY from 8am-4pm)    [phone]  [facility] (24/7)   [phone]  [place] National Suicide Hotline   [phone] Diamantina Monks)

## 2020-11-11 NOTE — Assessment & Plan Note (Signed)
Patient reports that she has slept only a few minutes over the last 2 weeks.  Lays in bed for 12 hours reports that her mind is racing.  Was on medications for bipolar disorder including Seroquel and Celexa but has stopped taking them.  Denies any SI or HI at this time.  Denies any auditory visual hallucinations. - Restarted patient's Seroquel at this time, prescription sent to [facility] which she requested - Recommended patient follow-up with her psychiatric provider regarding other medications and treatment that she may need and patient is agreeable to this - Provided patient with resource information on psychiatric providers to accept Medicaid or no insurance - Strict ED precautions given - Suicide precautions discussed and patient is agreeable to these.

## 2020-11-15 NOTE — Progress Notes (Signed)
Subjective:   Patient ID: Kristina Moreno    DOB: 09/23/1966, 55 y.o. female   MRN: [id number]  Kristina Moreno is a 55 y.o. female with a history of anxiety, bipolar I disorder, HLD, insomnia here for sleeping problems.  Insomnia: Patient seen on 11/11/20 by Dr. Caron Presume for difficulties with sleeping. Reported racing thoughts. History of bipolar disorder that hasn't been medically treated. She was restarted on her Seroquel 100mg  qHS and instructed to follow up with her psychiatrist. Today she notes that the [facility] 100mg  helped her go to sleep but didn't keep her asleep and it made her super drowsy the next day. This made it very difficult for her to function at work. She also felt like it made her a zombie. She does not want to take this medication anymore. She is very desperate for sleep. She notes that her mind races and she cant get it to calm down. This has been going on for 2-3 weeks now. She notes that she chronically has a lot of energy and can be functional with very little sleep. She notes that she does tend to start multiple projects and not finish them but she notes that that is something shes always done. Denies any caffeine use. Denies any electronics prior to bed.   Health Maintenance: Health Maintenance Due  Topic  . COVID-19 Vaccine (1)  . MAMMOGRAM    Review of Systems:  Per HPI.   Objective:   BP 112/75   Pulse 70   Ht 5\' 5"  (1.651 m)   Wt 160 lb 9.6 oz (72.8 kg)   SpO2 99%   BMI 26.73 kg/m  Vitals and nursing note reviewed.  General: pleasant middle aged woman, sitting comfortably on exam bed, well nourished, well developed, in no acute distress with non-toxic appearance Resp: breathing comfortably on room air, speaking in full sentences MSK: gait normal Neuro: Alert and oriented, speech normal Psych:  Cognition and judgment appear intact. Alert, communicative  and cooperative with normal attention span and concentration. No apparent delusions, illusions,  hallucinations. Does not depressed, anxious, or manic appearing.   Assessment & Plan:   Insomnia Acute. May be related to untreated bipolar I disorder (hypomania) as it appears she's had difficulty with sleep for 2-3 weeks. Normally has no difficulty. Evaluated sleep hygiene and no red flags identified. Has been treated with Seroquel in past but appears current dose is too strong. Patient is open to trial of lower dose. She has also tried Hydroxyzine in the past which can be alternative option if Seroquel fails to help her symptoms. Strongly encouraged her to follow up with psych in order to obtain adequate treatment for her Bipolar disorder. She voiced understanding and agreement with plan. - Decrease seroquel to 25mg  qHS - Patient to call clinic in 2-3 days if no improvement or experiences adverse effects - If no improvement with above can trial Hydroxyzine 25-50mg  qHS given her endorsement of racing thoughts. Has had good success with this in the past as well.  - Referral to [facility] center   Bipolar I disorder with [facility]) Used to follow with [facility]. Has not followed up in >12 months. Interested in trying new psychiatrist.  - referral to [place] as above.   Health Maintenance: Due for mammogram - referral placed  Orders Placed This Encounter  Procedures  . MM Digital Screening    Standing Status:   Future    Standing Expiration Date:  11/16/2021    Order Specific Question:   Reason for Exam (SYMPTOM  OR DIAGNOSIS REQUIRED)    Answer:   screening    Order Specific Question:   Is the patient pregnant?    Answer:   No    Order Specific Question:   Preferred imaging location?    Answer:   [facility]  . Ambulatory referral to Behavioral Health    Referral Priority:   Routine    Referral Type:   Psychiatric    Referral Reason:   Specialty Services Required    Requested Specialty:   Behavioral Health    Number  of Visits Requested:   1   Meds ordered this encounter  Medications  . QUEtiapine (SEROQUEL) 25 MG tablet    Sig: Take 1 tablet (25 mg total) by mouth at bedtime.    Dispense:  30 tablet    Refill:  0    Mina Marble, DO PGY-3, [facility] 11/16/2020 8:14 PM

## 2020-11-16 ENCOUNTER — Other Ambulatory Visit: Payer: Self-pay

## 2020-11-16 ENCOUNTER — Ambulatory Visit (INDEPENDENT_AMBULATORY_CARE_PROVIDER_SITE_OTHER): Payer: Self-pay | Admitting: Family Medicine

## 2020-11-16 VITALS — BP 112/75 | HR 70 | Ht 65.0 in | Wt 160.6 lb

## 2020-11-16 DIAGNOSIS — F99 Mental disorder, not otherwise specified: Secondary | ICD-10-CM

## 2020-11-16 DIAGNOSIS — F319 Bipolar disorder, unspecified: Secondary | ICD-10-CM

## 2020-11-16 DIAGNOSIS — Z1231 Encounter for screening mammogram for malignant neoplasm of breast: Secondary | ICD-10-CM

## 2020-11-16 DIAGNOSIS — F5105 Insomnia due to other mental disorder: Secondary | ICD-10-CM

## 2020-11-16 MED ORDER — QUETIAPINE FUMARATE 25 MG PO TABS
25.0000 mg | ORAL_TABLET | Freq: Every day | ORAL | 0 refills | Status: DC
Start: 2020-11-16 — End: 2021-08-09

## 2020-11-16 NOTE — Patient Instructions (Signed)
It was a pleasure to see you today!  Thank you for choosing [facility] for your primary care.   Our plans for today were:  Stop Seroquel 100mg  and start Seroquel 25mg  nightly  If causes extreme next day drowsiness, call me before Friday and I will call in another medicine    To keep you healthy, please keep in mind the following health maintenance items that you are due for:   1. Mammogram - please schedule at earliest [place],   Mina Marble, DO

## 2020-11-16 NOTE — Assessment & Plan Note (Signed)
Acute. May be related to untreated bipolar I disorder (hypomania) as it appears she's had difficulty with sleep for 2-3 weeks. Normally has no difficulty. Evaluated sleep hygiene and no red flags identified. Has been treated with Seroquel in past but appears current dose is too strong. Patient is open to trial of lower dose. She has also tried Hydroxyzine in the past which can be alternative option if Seroquel fails to help her symptoms. Strongly encouraged her to follow up with psych in order to obtain adequate treatment for her Bipolar disorder. She voiced understanding and agreement with plan. - Decrease seroquel to 25mg  qHS - Patient to call clinic in 2-3 days if no improvement or experiences adverse effects - If no improvement with above can trial Hydroxyzine 25-50mg  qHS given her endorsement of racing thoughts. Has had good success with this in the past as well.  - Referral to [facility] center

## 2020-11-16 NOTE — Assessment & Plan Note (Signed)
Used to follow with [facility]. Has not followed up in >12 months. Interested in trying new psychiatrist.  - referral to [place] as above.

## 2021-01-11 NOTE — Progress Notes (Signed)
    SUBJECTIVE:   CHIEF COMPLAINT / HPI:   Right under arm boil Has had recurrent boils in under arms and groin Has been worsening for 2 weeks, but has always been present No fevers Has drained a little in the last few weeks and has had a smell Has been doing hot compresses on it Using cortisone in the beginning She reports a history of similar boils in her son and her mother She states that she was seen by a surgeon many years ago, but was told that she did not need surgery at that time She was told to use antibacterial soap and water that was not too hot, which she has been doing, but they continue to recur Last time she had 1 of these lanced in her armpit was about 4 or 5 years ago, prior to that was about 20 years ago  PERTINENT  PMH / PSH: Bipolar 1, anxiety, HLD, insomnia  OBJECTIVE:   BP 110/80   Pulse 77   Wt 161 lb 3.2 oz (73.1 kg)   SpO2 98%   BMI 26.83 kg/m    Physical Exam:  General: 55 y.o. female in NAD Lungs: Breathing comfortably on room air Skin: warm and dry, right axilla with 1.5 cm indurated erythematous swelling with surrounding erythema extending about 4 cm superiorly, multiple indurated tracks also palpated within the axilla, does not feel fluctuant, no purulent discharge expressed, there is an overlying linear scar likely from prior attempt at I&D    ASSESSMENT/PLAN:   Infected skin lesion Patient most likely has a diagnosis of hydradenitis suppurativa.  Discussed with her that these will likely continue to recur.  Examined her with Dr. Erin Hearing, who agrees that it does not appear fluctuant enough to have a successful I&D at this time.  It is obviously showing signs of infection, she does not have systemic symptoms at this time.  We will treat her with doxycycline 100 mg twice daily for 10 days.  She was advised that if this does not improve, worsens, develops fever, she should be seen right away.  Also advised that if she has improvement, but then has  one area that appears to remain in a somewhat fluctuant, could consider I&D at that time.  This is complicated by the fact that she does not have insurance, ultimately she will need a referral to surgery if these continue to recur, but she declines at this time.  Did encourage her to obtain financial assistance packet upon leaving.     Cleophas Dunker, [place]

## 2021-01-12 ENCOUNTER — Other Ambulatory Visit: Payer: Self-pay

## 2021-01-12 ENCOUNTER — Ambulatory Visit (INDEPENDENT_AMBULATORY_CARE_PROVIDER_SITE_OTHER): Payer: Self-pay | Admitting: Family Medicine

## 2021-01-12 ENCOUNTER — Ambulatory Visit (INDEPENDENT_AMBULATORY_CARE_PROVIDER_SITE_OTHER): Payer: No Payment, Other | Admitting: Clinical

## 2021-01-12 VITALS — BP 110/80 | HR 77 | Wt 161.2 lb

## 2021-01-12 DIAGNOSIS — F1220 Cannabis dependence, uncomplicated: Secondary | ICD-10-CM

## 2021-01-12 DIAGNOSIS — L089 Local infection of the skin and subcutaneous tissue, unspecified: Secondary | ICD-10-CM

## 2021-01-12 DIAGNOSIS — F331 Major depressive disorder, recurrent, moderate: Secondary | ICD-10-CM | POA: Diagnosis not present

## 2021-01-12 DIAGNOSIS — F4310 Post-traumatic stress disorder, unspecified: Secondary | ICD-10-CM

## 2021-01-12 MED ORDER — DOXYCYCLINE HYCLATE 100 MG PO TABS: 100.0000 mg | ORAL_TABLET | Freq: Two times a day (BID) | ORAL | 0 refills | Status: DC

## 2021-01-12 NOTE — Assessment & Plan Note (Signed)
Patient most likely has a diagnosis of hydradenitis suppurativa.  Discussed with her that these will likely continue to recur.  Examined her with Dr. Erin Hearing, who agrees that it does not appear fluctuant enough to have a successful I&D at this time.  It is obviously showing signs of infection, she does not have systemic symptoms at this time.  We will treat her with doxycycline 100 mg twice daily for 10 days.  She was advised that if this does not improve, worsens, develops fever, she should be seen right away.  Also advised that if she has improvement, but then has one area that appears to remain in a somewhat fluctuant, could consider I&D at that time.  This is complicated by the fact that she does not have insurance, ultimately she will need a referral to surgery if these continue to recur, but she declines at this time.  Did encourage her to obtain financial assistance packet upon leaving.

## 2021-01-12 NOTE — Patient Instructions (Signed)
Thank you for coming to see me today. It was a pleasure. Today we talked about:   Ask for the financial assistance information at the front.  Take doxycycline 100mg  twice daily for two days.  Take with food and eat yogurt while you are taking to avoid a UTI.    If this doesn't completely take it away, or if you are left with a small area that feels full of pus, come back to see about getting it lanced.    You may need to go to a surgeon since they keep coming back, but we will hold off on that.  If you have any questions or concerns, please do not hesitate to call the office at [phone].  Best,   [place] Constable, DO
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
## 2021-04-09 DIAGNOSIS — Z113 Encounter for screening for infections with a predominantly sexual mode of transmission: Secondary | ICD-10-CM | POA: Insufficient documentation

## 2021-04-09 NOTE — Patient Instructions (Signed)
Thank you for coming in to see [place] today! Please see below to review our plan for today's visit:  1. We have performed STI testing at today's visit. We will follow up with results.  2. We are checking A1c (for diabetes marketing) and cholesterol levels.  3. You can go get Shingles vaccine at pharmacy.    Please call the clinic at [phone] if your symptoms worsen or you have any concerns. It was our pleasure to serve you!   Dr. Milus Banister [facility]

## 2021-04-09 NOTE — Progress Notes (Signed)
    SUBJECTIVE:   CHIEF COMPLAINT / HPI:   Patient desires pap/STI testing: history of Hysterectomy 2007, therefore pap not indicated; however can perform STI check and wet prep.  Patient also desires HIV and syphilis testing.  Concerns for decreased appetite: Patient reports having concerns for decrease in her appetite.  She reports feeling more stressed recently.  With her history of bipolar 1 disorder with depression this could potentially be a side effect of the depression.  Patient also feels as if her blood sugar goes very low and would like to have her A1c checked.  Health maintenance: Patient is due for mammogram (scheduled 05/29/2021), as well as shingles vaccine, COVID-vaccine.  She has already had hepatitis C tested previously.  She is not currently due for colonoscopy.  Per chart review has not had cholesterol testing and agrees to do this today.   PERTINENT  PMH / PSH:  Patient Active Problem List   Diagnosis Date Noted   Screening cholesterol level 04/10/2021   Diabetes mellitus screening 04/10/2021   Prediabetes 04/10/2021   Routine screening for STI (sexually transmitted infection) 04/09/2021   Insomnia 11/11/2020   Bipolar I disorder with depression ([place]) 10/05/2019   Anxiety state 11/04/2018   Hyperlipidemia 02/06/2016   Infected skin lesion 01/11/2014     OBJECTIVE:   BP 110/60   Pulse 92   Ht 5\' 5"  (1.651 m)   Wt 160 lb 6.4 oz (72.8 kg)   SpO2 98%   BMI 26.69 kg/m    Physical exam: General: well appearing, nontoxic appearing Respiratory: comfortable work of breathing on RA Cardio: RRR, S1S2 present  GU: normal appearing labia minora and majora without lesion, normal appearing vaginal rugae with scant clear/white vaginal discharge, cervix surgically absent  ASSESSMENT/PLAN:   Diabetes mellitus screening Patient screened today for diabetes with A1c.  Patient was concerned for feeling as if she was having hypoglycemic episodes.  Patient's A1c returned at  6.0% indicating prediabetes. -No medication at this time, will encourage lifestyle modifications -Repeat A1c in 3 months (September 2022).  Prediabetes Patient screened today for diabetes with A1c.  Patient was concerned for feeling as if she was having hypoglycemic episodes.  Patient's A1c returned at 6.0% indicating prediabetes. -No medication at this time, will encourage lifestyle modifications -Repeat A1c in 3 months (September 2022).  Routine screening for STI (sexually transmitted infection) Patient desires STI testing, asymptomatic but is sexually active. -Patient screened today for gonorrhea, chlamydia, trichomonas, candidiasis and BV.  Also screen for HIV and RPR. -We will follow with results and treat as needed  Screening cholesterol level Patient has not had recent cholesterol level assessed.  Unable to calculate ASCVD risk.  Patient not currently taking a statin. -Follow-up lipid panel results, treat as needed    1. We have performed STI testing at today's visit. We will follow up with results.  2. We are checking A1c (for diabetes marketing) and cholesterol levels.  3. You can go get Shingles vaccine at pharmacy.   [place]

## 2021-04-10 ENCOUNTER — Ambulatory Visit (INDEPENDENT_AMBULATORY_CARE_PROVIDER_SITE_OTHER): Payer: Medicaid Other | Admitting: Family Medicine

## 2021-04-10 ENCOUNTER — Encounter: Payer: Self-pay | Admitting: Family Medicine

## 2021-04-10 ENCOUNTER — Other Ambulatory Visit (HOSPITAL_COMMUNITY)
Admission: RE | Admit: 2021-04-10 | Discharge: 2021-04-10 | Disposition: A | Discharge status: Home / Self Care | Payer: Medicaid Other | Source: Ambulatory Visit | Attending: Family Medicine | Admitting: Family Medicine

## 2021-04-10 ENCOUNTER — Other Ambulatory Visit: Payer: Self-pay

## 2021-04-10 VITALS — BP 110/60 | HR 92 | Ht 65.0 in | Wt 160.4 lb

## 2021-04-10 DIAGNOSIS — Z113 Encounter for screening for infections with a predominantly sexual mode of transmission: Secondary | ICD-10-CM | POA: Diagnosis not present

## 2021-04-10 DIAGNOSIS — R7303 Prediabetes: Secondary | ICD-10-CM

## 2021-04-10 DIAGNOSIS — Z131 Encounter for screening for diabetes mellitus: Secondary | ICD-10-CM | POA: Insufficient documentation

## 2021-04-10 DIAGNOSIS — Z13220 Encounter for screening for lipoid disorders: Secondary | ICD-10-CM | POA: Insufficient documentation

## 2021-04-10 LAB — POCT WET PREP (WET MOUNT)
Clue Cells Wet Prep Whiff POC: NEGATIVE
Trichomonas Wet Prep HPF POC: ABSENT

## 2021-04-10 LAB — POCT GLYCOSYLATED HEMOGLOBIN (HGB A1C): Hemoglobin A1C: 6 % — AB (ref 4.0–5.6)

## 2021-04-10 NOTE — Assessment & Plan Note (Signed)
Patient screened today for diabetes with A1c.  Patient was concerned for feeling as if she was having hypoglycemic episodes.  Patient's A1c returned at 6.0% indicating prediabetes. -No medication at this time, will encourage lifestyle modifications -Repeat A1c in 3 months (September 2022).

## 2021-04-10 NOTE — Assessment & Plan Note (Signed)
Patient desires STI testing, asymptomatic but is sexually active. -Patient screened today for gonorrhea, chlamydia, trichomonas, candidiasis and BV.  Also screen for HIV and RPR. -We will follow with results and treat as needed

## 2021-04-10 NOTE — Assessment & Plan Note (Signed)
Patient has not had recent cholesterol level assessed.  Unable to calculate ASCVD risk.  Patient not currently taking a statin. -Follow-up lipid panel results, treat as needed

## 2021-04-11 ENCOUNTER — Encounter: Payer: Self-pay | Admitting: Family Medicine

## 2021-04-11 LAB — HIV ANTIBODY (ROUTINE TESTING W REFLEX): HIV Screen 4th Generation wRfx: NONREACTIVE

## 2021-04-11 LAB — LIPID PANEL
Chol/HDL Ratio: 4 ratio (ref 0.0–4.4)
Cholesterol, Total: 235 mg/dL — ABNORMAL HIGH (ref 100–199)
HDL: 59 mg/dL (ref >39)
LDL Chol Calc (NIH): 155 mg/dL — ABNORMAL HIGH (ref 0–99)
Triglycerides: 118 mg/dL (ref 0–149)
VLDL Cholesterol Cal: 21 mg/dL (ref 5–40)

## 2021-04-11 LAB — RPR: RPR Ser Ql: NONREACTIVE

## 2021-04-11 LAB — CERVICOVAGINAL ANCILLARY ONLY
Chlamydia: NEGATIVE
Comment: NEGATIVE
Comment: NORMAL
Neisseria Gonorrhea: NEGATIVE

## 2021-05-22 ENCOUNTER — Encounter: Payer: Self-pay | Admitting: Gastroenterology

## 2021-05-29 ENCOUNTER — Other Ambulatory Visit: Payer: Self-pay

## 2021-05-29 ENCOUNTER — Ambulatory Visit
Admission: RE | Admit: 2021-05-29 | Discharge: 2021-05-29 | Disposition: A | Discharge status: Home / Self Care | Payer: Medicaid Other | Source: Ambulatory Visit | Attending: Family Medicine | Admitting: Family Medicine

## 2021-05-29 DIAGNOSIS — Z1231 Encounter for screening mammogram for malignant neoplasm of breast: Secondary | ICD-10-CM

## 2021-05-30 DIAGNOSIS — Z20822 Contact with and (suspected) exposure to covid-19: Secondary | ICD-10-CM | POA: Diagnosis not present

## 2021-06-30 ENCOUNTER — Ambulatory Visit (HOSPITAL_COMMUNITY)
Admission: EM | Admit: 2021-06-30 | Discharge: 2021-06-30 | Disposition: A | Discharge status: Home / Self Care | Payer: 59 | Attending: Family Medicine | Admitting: Family Medicine

## 2021-06-30 ENCOUNTER — Other Ambulatory Visit: Payer: Self-pay

## 2021-06-30 ENCOUNTER — Encounter (HOSPITAL_COMMUNITY): Payer: Self-pay

## 2021-06-30 ENCOUNTER — Telehealth: Payer: Self-pay

## 2021-06-30 DIAGNOSIS — W57XXXA Bitten or stung by nonvenomous insect and other nonvenomous arthropods, initial encounter: Secondary | ICD-10-CM

## 2021-06-30 DIAGNOSIS — R21 Rash and other nonspecific skin eruption: Secondary | ICD-10-CM

## 2021-06-30 MED ORDER — TRIAMCINOLONE ACETONIDE 0.1 % EX CREA: 1.0000 "application " | TOPICAL_CREAM | Freq: Two times a day (BID) | CUTANEOUS | 0 refills | Status: DC

## 2021-06-30 NOTE — ED Triage Notes (Signed)
Pt presents with spider bite on left breast X 2 days ago.

## 2021-06-30 NOTE — ED Provider Notes (Signed)
[facility]    CSN: [id number] Arrival date & time: 06/30/21  1454      History   Chief Complaint Chief Complaint  Patient presents with   Insect Bite    HPI Kristina Moreno is a 55 y.o. female.   Patient states she was bitten by a spider about 2 days ago on her left breast and now has a very itchy red area and pain in this region of the breast radiating toward her underarm.  Denies drainage, bleeding, fever, chills, nipple discharge, decreased range of motion in the left upper extremity.  So far has not been trying anything over-the-counter for symptoms.  Past Medical History:  Diagnosis Date   Anxiety    Asthma    Borderline personality disorder ([place])    Contact dermatitis    COPD (chronic obstructive pulmonary disease) ([place])    Fibroid    Meniere disease    Sciatica     Patient Active Problem List   Diagnosis Date Noted   Screening cholesterol level 04/10/2021   Diabetes mellitus screening 04/10/2021   Prediabetes 04/10/2021   Routine screening for STI (sexually transmitted infection) 04/09/2021   Insomnia 11/11/2020   Bipolar I disorder with depression ([place]) 10/05/2019   Anxiety state 11/04/2018   Hyperlipidemia 02/06/2016   Infected skin lesion 01/11/2014    Past Surgical History:  Procedure Laterality Date   TOTAL ABDOMINAL HYSTERECTOMY  2007   transvaginal hysterectomy  04/03/2006   Pathology revealed benign uterus and cervix    OB History     Gravida  3   Para  2   Term  2   Preterm      AB  1   Living  2      SAB      IAB  1   Ectopic      Multiple      Live Births               Home Medications    Prior to Admission medications   Medication Sig Start Date End Date Taking? Authorizing Provider  triamcinolone cream (KENALOG) 0.1 % Apply 1 application topically 2 (two) times daily. 06/30/21  Yes Volney American, PA-C  citalopram (CELEXA) 20 MG tablet Take 1 tablet (20 mg total) by mouth daily. 11/06/19    Mullis, Kiersten P, DO  fluticasone (FLONASE) 50 MCG/ACT nasal spray Place 2 sprays into both nostrils daily. 05/25/19   Mullis, Kiersten P, DO  ibuprofen (ADVIL,MOTRIN) 200 MG tablet Take 800 mg by mouth every 8 (eight) hours as needed for mild pain.     [provider]  Multiple Vitamin (MULTIVITAMIN WITH MINERALS) TABS tablet Take 1 tablet by mouth daily.    [provider]  QUEtiapine (SEROQUEL) 25 MG tablet Take 1 tablet (25 mg total) by mouth at bedtime. 11/16/20   Mullis, Archie Endo, DO    Family History Family History  Problem Relation Age of Onset   Leukemia Father    COPD Mother    Colon polyps Paternal Grandfather    Anesthesia problems Neg Hx    Hypotension Neg Hx    Malignant hyperthermia Neg Hx    Pseudochol deficiency Neg Hx    Colon cancer Neg Hx    Esophageal cancer Neg Hx    Rectal cancer Neg Hx    Stomach cancer Neg Hx     Social History Social History   Tobacco Use   Smoking status: Former  Types: Cigarettes    Start date: 10/23/1975    Quit date: 12/14/2007    Years since quitting: 13.5   Smokeless tobacco: Never  Substance Use Topics   Alcohol use: Yes    Alcohol/week: 0.0 standard drinks    Comment: occasionally   Drug use: Yes    Frequency: 7.0 times per week    Types: Marijuana    Comment: patient states that she uses marijuana everyday.  Last smoked this am 0900 am. maw     Allergies   Patient has no known allergies.   Review of Systems Review of Systems Per HPI  Physical Exam Triage Vital Signs ED Triage Vitals  Enc Vitals Group     BP 06/30/21 1637 137/79     Pulse Rate 06/30/21 1637 71     Resp 06/30/21 1637 17     Temp 06/30/21 1637 98.5 F (36.9 C)     Temp Source 06/30/21 1637 Oral     SpO2 06/30/21 1637 99 %     Weight --      Height --      Head Circumference --      Peak Flow --      Pain Score 06/30/21 1638 7     Pain Loc --      Pain Edu? --      Excl. in [place]? --    No data found.  Updated  Vital Signs BP 137/79 (BP Location: Right Arm)   Pulse 71   Temp 98.5 F (36.9 C) (Oral)   Resp 17   SpO2 99%   Visual Acuity Right Eye Distance:   Left Eye Distance:   Bilateral Distance:    Right Eye Near:   Left Eye Near:    Bilateral Near:     Physical Exam Vitals and nursing note reviewed.  Constitutional:      Appearance: Normal appearance. She is not ill-appearing.  HENT:     Head: Atraumatic.  Eyes:     Extraocular Movements: Extraocular movements intact.     Conjunctiva/sclera: Conjunctivae normal.  Cardiovascular:     Rate and Rhythm: Normal rate and regular rhythm.     Heart sounds: Normal heart sounds.  Pulmonary:     Effort: Pulmonary effort is normal.     Breath sounds: No wheezing.  Musculoskeletal:        General: Normal range of motion.     Cervical back: Normal range of motion and neck supple.  Skin:    General: Skin is warm and dry.     Findings: Erythema present.     Comments: Small area of erythematous papular rash on left breast, no streaking, drainage, ulceration, fluctuance  Neurological:     Mental Status: She is alert and oriented to person, place, and time.  Psychiatric:        Mood and Affect: Mood normal.        Thought Content: Thought content normal.        Judgment: Judgment normal.     UC Treatments / Results  Labs (all labs ordered are listed, but only abnormal results are displayed) Labs Reviewed - No data to display  EKG   Radiology No results found.  Procedures Procedures (including critical care time)  Medications Ordered in UC Medications - No data to display  Initial Impression / Assessment and Plan / UC Course  I have reviewed the triage vital signs and the nursing notes.  Pertinent labs & imaging results that were  available during my care of the patient were reviewed by me and considered in my medical decision making (see chart for details).     No evidence of abscess or cellulitis at this point, suspect  localized allergic reaction to the bite.  We will treat with triamcinolone cream, Benadryl, ice off-and-on.  Follow-up if worsening or not resolving.  Final Clinical Impressions(s) / UC Diagnoses   Final diagnoses:  Insect bite, unspecified site, initial encounter  Rash   Discharge Instructions   None    ED Prescriptions     Medication Sig Dispense Auth. Provider   triamcinolone cream (KENALOG) 0.1 % Apply 1 application topically 2 (two) times daily. 30 g Volney American, [place]      PDMP not reviewed this encounter.   Volney American, [place] 06/30/21 1730

## 2021-06-30 NOTE — Telephone Encounter (Signed)
Patient calls nurse line requesting same day appointment for spider bite. Patient reports that bite is on left breast and occurred two days ago. Patient reports that she is having worsened pain today that radiates under her arm with redness.   We do not have any available appointments until Monday. Advised patient to be seen in UC.   Talbot Grumbling, RN

## 2021-08-08 ENCOUNTER — Ambulatory Visit (INDEPENDENT_AMBULATORY_CARE_PROVIDER_SITE_OTHER): Payer: 59 | Admitting: Student

## 2021-08-08 ENCOUNTER — Encounter: Payer: Self-pay | Admitting: Student

## 2021-08-08 ENCOUNTER — Other Ambulatory Visit: Payer: Self-pay

## 2021-08-08 VITALS — BP 110/70 | HR 72 | Ht 65.0 in | Wt 164.6 lb

## 2021-08-08 DIAGNOSIS — G608 Other hereditary and idiopathic neuropathies: Secondary | ICD-10-CM

## 2021-08-08 DIAGNOSIS — Z23 Encounter for immunization: Secondary | ICD-10-CM

## 2021-08-08 DIAGNOSIS — F411 Generalized anxiety disorder: Secondary | ICD-10-CM

## 2021-08-08 DIAGNOSIS — R7303 Prediabetes: Secondary | ICD-10-CM

## 2021-08-08 DIAGNOSIS — L918 Other hypertrophic disorders of the skin: Secondary | ICD-10-CM

## 2021-08-08 LAB — POCT GLYCOSYLATED HEMOGLOBIN (HGB A1C): Hemoglobin A1C: 5.6 % (ref 4.0–5.6)

## 2021-08-08 MED ORDER — CITALOPRAM HYDROBROMIDE 20 MG PO TABS: 20.0000 mg | ORAL_TABLET | Freq: Every day | ORAL | 3 refills | Status: DC

## 2021-08-08 NOTE — Patient Instructions (Addendum)
Ms. Hitz It is such a joy to take care you! Thank you for coming in today.   As a reminder, here is a recap of what we talked about today:  - For your anxiety, I am starting you on a medicine called citalopram. It looks like you have been on this class of medicine in the past without any issues. If you begin noticing that you are sleeping less or otherwise showing symptoms of becoming manic, please stop taking this immediately and come see me.  - For the burning in your hands, you can use topical lidocaine which is available over-the-counter at any drugstore.  Hears a picture of what it looks like at [facility].  -We are checking your A1c today, it is better today! 5.6% which means you are no longer in the pre-diabetic range. Keep up the great work!!  -I removed the skin tag that was on your chest and bothering you.  You can remove the Band-Aid later today.   I recommend that you always bring your medications to each appointment as this makes it easy to ensure we are on the correct medications and helps [place] not miss when refills are needed.  Take care and seek immediate care sooner if you develop any concerns.   Marnee Guarneri, MD [place]

## 2021-08-08 NOTE — Progress Notes (Signed)
    SUBJECTIVE:   CHIEF COMPLAINT / HPI:  Skin tag on chest  Patient reports having a skin tag on her chest for months that has been getting caught on her clothes and often bleeds.   Burning in Hands She says that she has episodic burning in the webbing between her thumb and second finger for the past 2 to 3 months.  She feels that it is in the skin and last several seconds at a time before going away.  She says there are no identifiable triggers or movements that cause the burning to occur and that it happens at irregular intervals.  It happens as often as twice daily but sometimes she will go a week or 2 without an episode.  Anxiety Patient reports increased anxiety recently with several life changes over the past few months.  In the past she has been on Xanax, Seroquel, Celexa.  She says that Xanax worked best for her but that Seroquel was "too strong."  She denied any episodes of mania or hypomania on Celexa.  She does carry a diagnosis of bipolar 1 disorder but denies any recent manic episodes.  She would like to try Celexa again.  Prediabetes Patient was diagnosed with prediabetes with an A1c of 6.0% four months ago. Since then she has been aggressive in making diet changes as she says she is "afraid" of diabetes.   PERTINENT  PMH / PSH: Bipolar disorder, prediabetes  OBJECTIVE:   BP 110/70   Pulse 72   Ht 5\' 5"  (1.651 m)   Wt 164 lb 9.6 oz (74.7 kg)   SpO2 100%   BMI 27.39 kg/m   Physical Exam Constitutional:      General: She is not in acute distress. Cardiovascular:     Rate and Rhythm: Normal rate and regular rhythm.     Pulses: Normal pulses.  Pulmonary:     Effort: Pulmonary effort is normal.     Breath sounds: Normal breath sounds.  Chest:       Comments: Pedunculated skin tag Psychiatric:        Mood and Affect: Affect normal. Mood is not anxious or depressed.        Speech: Speech normal. Speech is not rapid and pressured or tangential.        Behavior:  Behavior normal.     ASSESSMENT/PLAN:   Skin tag After a discussion of risks and benefits of removal, informed consent was obtained and skin tag was removed using sterile scissors. Patient tolerated the procedure well and had no bleeding from the site.   Idiopathic small fiber sensory neuropathy Burning sensation consistent with idiopathic small fiber sensory neuropathy of the L hand. Given A1c WNL, do not believe this represents diabetic neuropathy.  - Will trial topical lidocaine - If no improvement with topical therapy, will trial gabapentin  Prediabetes A1c today 5.6%. Congratulated patient on her success with diet and lifestyle interventions.   Anxiety state Currently not on any medication. She did not tolerate Seroquel well in the past. Has been on Xanax and Celexa in the past with successful symptom control and without manic symptoms. Will trial Celexa 20mg  daily with strict return precautions for manic symptoms.  - f/u in four weeks     Pearla Dubonnet, MD [place]

## 2021-08-09 DIAGNOSIS — G608 Other hereditary and idiopathic neuropathies: Secondary | ICD-10-CM | POA: Insufficient documentation

## 2021-08-09 DIAGNOSIS — L918 Other hypertrophic disorders of the skin: Secondary | ICD-10-CM | POA: Insufficient documentation

## 2021-08-09 NOTE — Assessment & Plan Note (Signed)
After a discussion of risks and benefits of removal, informed consent was obtained and skin tag was removed using sterile scissors. Patient tolerated the procedure well and had no bleeding from the site.

## 2021-08-09 NOTE — Assessment & Plan Note (Addendum)
A1c today 5.6%. Congratulated patient on her success with diet and lifestyle interventions.

## 2021-08-09 NOTE — Assessment & Plan Note (Signed)
Currently not on any medication. She did not tolerate Seroquel well in the past. Has been on Xanax and Celexa in the past with successful symptom control and without manic symptoms. Will trial Celexa 20mg  daily with strict return precautions for manic symptoms.  - f/u in four weeks

## 2021-08-09 NOTE — Assessment & Plan Note (Addendum)
Burning sensation consistent with idiopathic small fiber sensory neuropathy of the L hand. Given A1c WNL, do not believe this represents diabetic neuropathy.  - Will trial topical lidocaine - If no improvement with topical therapy, will trial gabapentin

## 2021-09-12 ENCOUNTER — Telehealth: Payer: Self-pay

## 2021-09-12 DIAGNOSIS — F411 Generalized anxiety disorder: Secondary | ICD-10-CM

## 2021-09-12 NOTE — Telephone Encounter (Signed)
Patient calls nurse line reporting she has not been able to pick up Celexa prescription. Patient reports her pharmacy never called her. Patient informed the prescription was sent to [facility], however she wanted this to go to [facility] on [address].   I have called [facility] and cancelled prescription. Will forward to provider to send in since it has been over one month.    Please advise.

## 2021-09-13 MED ORDER — CITALOPRAM HYDROBROMIDE 20 MG PO TABS: 20.0000 mg | ORAL_TABLET | Freq: Every day | ORAL | 3 refills | Status: DC

## 2021-10-05 ENCOUNTER — Telehealth: Payer: Self-pay | Admitting: *Deleted

## 2021-10-05 NOTE — Telephone Encounter (Signed)
Pt reports that her new apartment does not have grab bars in the bathroom.  She did slip today but "was not seriously hurt this time".  The complex owner told her if she received a letter from her doctor that the grab bars were needed then they would install them.  To PCP. Christen Bame, CMA

## 2021-10-06 NOTE — Telephone Encounter (Signed)
Note has been written and signed and placed to be picked up at front desk.  Patient called, patient aware. Pearla Dubonnet, MD

## 2021-10-29 NOTE — Progress Notes (Addendum)
° ° °  SUBJECTIVE:   CHIEF COMPLAINT / HPI:   Right Shoulder Pain  Kristina Moreno is a 56 y.o. female who presents to the [facility] clinic to discuss right shoulder pain since fall that occurred on Christmas.  States that she was inebriated and was on a hover board.  She tried to get off and fell backwards, she is to her right shoulder to break the fall.  States that she thought she had broken her arm but she was able to move it fine after the fall.  She initially placed 2 bags of ice to the area and has been taking ibuprofen every other day which seems to help for a bit.  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  She is still able to work but notes some soreness over the biceps area.  States that sleeping on her right side and pressure to the area exacerbates the pain.  She has had no previous injuries to her shoulders.  Denies any swelling, numbness, or tingling.  PERTINENT  PMH / PSH:  Past Medical History:  Diagnosis Date   Anxiety    Asthma    Borderline personality disorder ([place])    Contact dermatitis    COPD (chronic obstructive pulmonary disease) (HCC)    Fibroid    Meniere disease    Sciatica     OBJECTIVE:   BP 100/60    Pulse 90    Ht 5\' 5"  (1.651 m)    Wt 167 lb (75.8 kg)    SpO2 99%    BMI 27.79 kg/m    General: NAD, pleasant, able to participate in exam Respiratory: Normal work of breathing, on room air MSK: Right Shoulder: Inspection reveals no obvious deformity, atrophy, or asymmetry b/l. No bruising. No swelling Palpation is normal with no TTP over [facility] joint or bicipital groove b/l. Full ROM in flexion, abduction, internal/external rotation b/l NV intact distally b/l Special Tests:  - Impingement: Positive Hawkins, neers, empty can sign. - Supraspinatous: Positive empty can - Biceps tendon: No Popeye deformity - Labrum: good stability Skin: warm and dry, no rashes noted Neuro: alert, no obvious focal deficits, normal sensation to touch, normal strength    ASSESSMENT/PLAN:   Shoulder impingement, right Suspect impingement given examination with positive Hawkin's, empty can and neers tests. Doubt fracture given no edema, deformity and without difficulties doing daily activities. Will defer x-rays at this time given injury 15 days ago and without bony abnormalities. No soft tissue swelling or abnormality that would warrant MRI. Suspect this will improve with conservative treatments. -Continue conservative treatment with rest, ice, NSAIDs -Rx Voltaren gel -Referral to physical therapy -Return if no improvement or worsening symptoms     Sharion Settler, DO [place]

## 2021-10-30 ENCOUNTER — Ambulatory Visit (INDEPENDENT_AMBULATORY_CARE_PROVIDER_SITE_OTHER): Payer: Self-pay | Admitting: Family Medicine

## 2021-10-30 ENCOUNTER — Encounter: Payer: Self-pay | Admitting: Family Medicine

## 2021-10-30 ENCOUNTER — Other Ambulatory Visit: Payer: Self-pay

## 2021-10-30 VITALS — BP 100/60 | HR 90 | Ht 65.0 in | Wt 167.0 lb

## 2021-10-30 DIAGNOSIS — M7541 Impingement syndrome of right shoulder: Secondary | ICD-10-CM

## 2021-10-30 DIAGNOSIS — M25811 Other specified joint disorders, right shoulder: Secondary | ICD-10-CM | POA: Insufficient documentation

## 2021-10-30 MED ORDER — DICLOFENAC SODIUM 1 % EX GEL: 4.0000 g | Freq: Four times a day (QID) | CUTANEOUS | 0 refills | Status: DC

## 2021-10-30 NOTE — Patient Instructions (Addendum)
It was wonderful to see you today.  Today we talked about:  -Ice the area. -You can continue to take Ibuprofen as needed (take with food) -I have sent in Voltaren gel to use to that area -Rest the area! Try not to sleep on it -I have sent a referral to Physical Therapy.   Thank you for choosing [facility].   Please call [phone] with any questions about today's appointment.  Please be sure to schedule follow up at the front  desk before you leave today.   Sharion Settler, DO PGY-2 Family Medicine    Shoulder Impingement Syndrome Shoulder impingement syndrome is a condition that causes pain when connective tissues (tendons) surrounding the shoulder joint become pinched. These tendons are part of the group of muscles and tissues that help to stabilize the shoulder (rotator cuff). Beneath the rotator cuff is a fluid-filled sac (bursa) that allows the muscles and tendons to glide smoothly. The bursa may become swollen or irritated (bursitis). Bursitis, swelling in the rotator cuff tendons, or both conditions can decrease how much space is under a bone in the shoulder joint (acromion), resulting in impingement. What are the causes? Shoulder impingement syndrome may be caused by bursitis or swelling of the rotator cuff tendons, which may result from: Repetitive overhead arm movements. Falling onto the shoulder. Weakness in the shoulder muscles. What increases the risk? You may be more likely to develop this condition if you: Play sports that involve throwing, such as baseball. Participate in sports such as tennis, volleyball, and swimming. Work as a Curator, Games developer, or Architect. Some people are also more likely to develop impingement syndrome because of the shape of their acromion bone. What are the signs or symptoms? The main symptom of this condition is pain on the front or side of the shoulder. The pain may: Get worse when lifting or raising the  arm. Get worse at night. Wake you up from sleeping. Feel sharp when the shoulder is moved and then fade to an ache. Other symptoms may include: Tenderness. Stiffness. Inability to raise the arm above shoulder level or behind the body. Weakness. How is this diagnosed? This condition may be diagnosed based on: Your symptoms and medical history. A physical exam. Imaging tests, such as: X-rays. MRI. Ultrasound. How is this treated? This condition may be treated by: Resting your shoulder and avoiding all activities that cause pain or put stress on the shoulder. Icing your shoulder. NSAIDs to help reduce pain and swelling. One or more injections of medicines to numb the area and reduce inflammation. Physical therapy. Surgery. This may be needed if nonsurgical treatments have not helped. Surgery may involve repairing the rotator cuff, reshaping the acromion, or removing the bursa. Follow these instructions at home: Managing pain, stiffness, and swelling  If directed, put ice on the injured area. Put ice in a plastic bag. Place a towel between your skin and the bag. Leave the ice on for 20 minutes, 2-3 times a day. Activity Rest and return to your normal activities as told by your health care provider. Ask your health care provider what activities are safe for you. Do exercises as told by your health care provider. General instructions Do not use any products that contain nicotine or tobacco, such as cigarettes, e-cigarettes, and chewing tobacco. These can delay healing. If you need help quitting, ask your health care provider. Ask your health care provider when it is safe for you to drive. Take over-the-counter and prescription medicines  only as told by your health care provider. Keep all follow-up visits as told by your health care provider. This is important. How is this prevented? Give your body time to rest between periods of activity. Be safe and responsible while being  active. This will help you avoid falls. Maintain physical fitness, including strength and flexibility. Contact a health care provider if: Your symptoms have not improved after 1-2 months of treatment and rest. You cannot lift your arm away from your body. Summary Shoulder impingement syndrome is a condition that causes pain when connective tissues (tendons) surrounding the shoulder joint become pinched. The main symptom of this condition is pain on the front or side of the shoulder. This condition is usually treated with rest, ice, and pain medicines as needed. This information is not intended to replace advice given to you by your health care provider. Make sure you discuss any questions you have with your health care provider. Document Revised: 01/30/2019 Document Reviewed: 04/02/2018 Elsevier Patient Education  2022 [employer].

## 2021-10-30 NOTE — Assessment & Plan Note (Addendum)
Suspect impingement given examination with positive Hawkin's, empty can and neers tests. Doubt fracture given no edema, deformity and without difficulties doing daily activities. Will defer x-rays at this time given injury 15 days ago and without bony abnormalities. No soft tissue swelling or abnormality that would warrant MRI. Suspect this will improve with conservative treatments. -Continue conservative treatment with rest, ice, NSAIDs -Rx Voltaren gel -Referral to physical therapy -Return if no improvement or worsening symptoms

## 2021-11-20 ENCOUNTER — Ambulatory Visit (INDEPENDENT_AMBULATORY_CARE_PROVIDER_SITE_OTHER): Payer: Self-pay | Admitting: Family Medicine

## 2021-11-20 ENCOUNTER — Other Ambulatory Visit: Payer: Self-pay

## 2021-11-20 VITALS — BP 120/76 | HR 79 | Ht 65.0 in | Wt 168.0 lb

## 2021-11-20 DIAGNOSIS — M25562 Pain in left knee: Secondary | ICD-10-CM

## 2021-11-20 DIAGNOSIS — M25561 Pain in right knee: Secondary | ICD-10-CM

## 2021-11-20 MED ORDER — TIZANIDINE HCL 2 MG PO TABS: 2.0000 mg | ORAL_TABLET | Freq: Three times a day (TID) | ORAL | 0 refills | Status: DC | PRN

## 2021-11-20 MED ORDER — DICLOFENAC SODIUM 1 % EX GEL: 4.0000 g | Freq: Four times a day (QID) | CUTANEOUS | 0 refills | Status: DC

## 2021-11-20 NOTE — Patient Instructions (Addendum)
It was nice seeing you today!  Recommend physical therapy for your knee.   Physical Therapy and Orthopedic Rehabilitation at [facility] [phone]  Try Voltaren gel on your knees. Try to cut back the ibuprofen.  Stay well, Zola Button, MD [place] [phone]  --  Make sure to check out at the front desk before you leave today.  Please arrive at least 15 minutes prior to your scheduled appointments.  If you had blood work today, I will send you a MyChart message or a letter if results are normal. Otherwise, I will give you a call.  If you had a referral placed, they will call you to set up an appointment. Please give [place] a call if you don't hear back in the next 2 weeks.  If you need additional refills before your next appointment, please call your pharmacy first.

## 2021-11-20 NOTE — Progress Notes (Signed)
° ° °  SUBJECTIVE:   CHIEF COMPLAINT / HPI:  Chief Complaint  Patient presents with   Knee Pain    both    Reports bilateral knee pain worse on the right knee for two weeks. Reports good relief with ibuprofen 800 mg once a day for the past 2 weeks.  She has been taking someone else's tramadol and muscle relaxer. Also applying ice. 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  Denies injury to the knee or recent falls.  She did have a fall 1 month ago where she hurt her right shoulder but did not injure her knee at that time.  Asking about knee x-ray.  Also asking about Ace wrap for her knee.  Requesting muscle relaxant prescription.  She is worried about potentially falling due to knee pain.  On chart review, she was seen for bilateral knee pain in 2018 felt to be related to OA and possible degenerative meniscal changes.  She states that she has not had knee issues recently up until 2 weeks ago.  PERTINENT  PMH / PSH: Bipolar disorder  Patient Care Team: Eppie Gibson, MD as PCP - General (Family Medicine)   OBJECTIVE:   BP 120/76    Pulse 79    Ht 5\' 5"  (1.651 m)    Wt 168 lb (76.2 kg)    SpO2 99%    BMI 27.96 kg/m   Physical Exam Constitutional:      General: She is not in acute distress. Cardiovascular:     Rate and Rhythm: Normal rate and regular rhythm.  Pulmonary:     Effort: Pulmonary effort is normal. No respiratory distress.     Breath sounds: Normal breath sounds.  Musculoskeletal:     Cervical back: Neck supple.     Comments: Bilateral knees without obvious deformity, minimal to no swelling.  She has mildly decreased extension of the right knee.  Mild tenderness to the medial and lateral joint lines bilaterally.  Negative Lachman and posterior drawer.  No laxity with varus/valgus stressing.  There is pain elicited with McMurray's bilaterally.  Skin:    General: Skin is warm and dry.  Neurological:      Mental Status: She is alert.     Motor: No weakness.     Depression screen PHQ 2/9 11/20/2021  Decreased Interest -  Down, Depressed, Hopeless 1  PHQ - 2 Score 1  Altered sleeping 1  Tired, decreased energy 1  Change in appetite 1  Feeling bad or failure about yourself  1  Trouble concentrating 1  Moving slowly or fidgety/restless 1  Suicidal thoughts 0  PHQ-9 Score 7  Difficult doing work/chores Not difficult at all  Some encounter information is confidential and restricted. Go to Review Flowsheets activity to see all data.  Some recent data might be hidden     {Show previous vital signs (optional):23777}    ASSESSMENT/PLAN:   Acute bilateral knee pain Atraumatic, acute, possibly related to OA.  Present for 2 weeks. - diclofenac gel - tizanidine per patient request - right knee sleeve given per patient request - PT (number given, already has referral for shoulder issue) - f/u 4 weeks, consider standing XR if not improved    Return in about 4 weeks (around 12/18/2021) for f/u knee pain.   Zola Button, MD [place]

## 2021-11-20 NOTE — Assessment & Plan Note (Signed)
Atraumatic, acute, possibly related to OA.  Present for 2 weeks. - diclofenac gel - tizanidine per patient request - right knee sleeve given per patient request - PT (number given, already has referral for shoulder issue) - f/u 4 weeks, consider standing XR if not improved

## 2022-03-06 NOTE — Progress Notes (Signed)
? ? ?  SUBJECTIVE:  ? ?CHIEF COMPLAINT / HPI:  ? ?Arm Abscess  ?States that she gets recurrent boils under her arm and sometimes her groin. She noticed one pop up under her right arm 2 days ago. Mildly painful. Hasn't done anything for it. No fevers. In the past she has had them lanced and also been on antibiotics. She would rather do antibiotics. She states they can get big quickly. She is using antibacterial soap and alcohol. Last time it was lanced was maybe 5+ years ago.  ? ?Per chart review, last prescribed Doxy 100 mg BID x10 days in March 2022. ? ?PERTINENT  PMH / PSH:  ?Past Medical History:  ?Diagnosis Date  ? Anxiety   ? Asthma   ? Borderline personality disorder ([place])   ? Contact dermatitis   ? COPD (chronic obstructive pulmonary disease) ([place])   ? Fibroid   ? Meniere disease   ? Sciatica   ? ? ?OBJECTIVE:  ? ?BP 129/77   Pulse 88   Wt 164 lb 9.6 oz (74.7 kg)   SpO2 99%   BMI 27.39 kg/m?   ? ?General: NAD, pleasant, able to participate in exam ?Respiratory: Normal respiratory effort ?Skin: Approximately 3 x 2 cm hard but mobile subcutaneous lesion under right axilla. No surrounding erythema, no streaking. Mildly painful to palpation ?Psych: Normal affect and mood ? ? ? ? ?ASSESSMENT/PLAN:  ? ?Suppurative hidradenitis ?History of recurrent boils since the age of 45, likely has hidradenitis suppurativa.  Today the area does not feel fluctuant and will defer I&D (also not routinely recommended for HS). Will start on Doxy 100 mg BID x10 days. Can consider longer courses in the future if develops more frequent boils. ?  ? ? ?Sharion Settler, DO ?[place]  ? ?

## 2022-03-07 ENCOUNTER — Other Ambulatory Visit: Payer: Self-pay

## 2022-03-07 ENCOUNTER — Ambulatory Visit (INDEPENDENT_AMBULATORY_CARE_PROVIDER_SITE_OTHER): Payer: Self-pay | Admitting: Family Medicine

## 2022-03-07 DIAGNOSIS — L732 Hidradenitis suppurativa: Secondary | ICD-10-CM | POA: Insufficient documentation

## 2022-03-07 MED ORDER — DOXYCYCLINE HYCLATE 100 MG PO TABS: 100.0000 mg | ORAL_TABLET | Freq: Two times a day (BID) | ORAL | 0 refills | Status: DC

## 2022-03-07 NOTE — Assessment & Plan Note (Signed)
History of recurrent boils since the age of 71, likely has hidradenitis suppurativa.  Today the area does not feel fluctuant and will defer I&D (also not routinely recommended for HS). Will start on Doxy 100 mg BID x10 days. Can consider longer courses in the future if develops more frequent boils. ?

## 2022-03-07 NOTE — Patient Instructions (Signed)
It was wonderful to see you today. ? ?Please bring ALL of your medications with you to every visit.  ? ?Today we talked about: ? ?-Return for redness, increased pain, fevers.  ?-We will start Doxycycline. Take this twice a day for 10 days. ? ? ?Thank you for choosing [facility].  ? ?Please call [phone] with any questions about today's appointment. ? ?Please be sure to schedule follow up at the front  desk before you leave today.  ? ?Sharion Settler, DO ?PGY-2 Family Medicine   ?

## 2022-03-12 ENCOUNTER — Ambulatory Visit (INDEPENDENT_AMBULATORY_CARE_PROVIDER_SITE_OTHER): Payer: Self-pay | Admitting: Family Medicine

## 2022-03-12 VITALS — BP 120/60 | HR 81 | Ht 65.0 in | Wt 164.0 lb

## 2022-03-12 DIAGNOSIS — L732 Hidradenitis suppurativa: Secondary | ICD-10-CM

## 2022-03-12 DIAGNOSIS — L02411 Cutaneous abscess of right axilla: Secondary | ICD-10-CM

## 2022-03-12 MED ORDER — NAPROXEN 500 MG PO TABS: 500.0000 mg | ORAL_TABLET | Freq: Two times a day (BID) | ORAL | 0 refills | Status: DC | PRN

## 2022-03-12 MED ORDER — SULFAMETHOXAZOLE-TRIMETHOPRIM 800-160 MG PO TABS: 1.0000 | ORAL_TABLET | Freq: Two times a day (BID) | ORAL | 0 refills | Status: AC

## 2022-03-12 NOTE — Progress Notes (Signed)
    SUBJECTIVE:   CHIEF COMPLAINT / HPI:  No chief complaint on file.   Patient recently seen in clinic 5 days ago on 5/17 for HS flare in her right axillary region 2 days prior. Given doxycycline. Since then, patient states the area has gotten larger and more painful with increasing redness. She did not think the antibiotics helped. She thinks there is tracking inside the abscess. She has been using diclofenac gel for pain. Due to pain, patient wants to have the abscess drained with general anesthesia rather than bedside drainage. Denies fever, chills.  PERTINENT  PMH / PSH: HS  Patient Care Team: Eppie Gibson, MD as PCP - General (Family Medicine)   OBJECTIVE:   BP 120/60   Pulse 81   Ht '5\' 5"'$  (1.651 m)   Wt 164 lb (74.4 kg)   SpO2 99%   BMI 27.29 kg/m   Physical Exam Constitutional:      General: She is not in acute distress. Cardiovascular:     Rate and Rhythm: Normal rate.  Pulmonary:     Effort: Pulmonary effort is normal. No respiratory distress.  Musculoskeletal:     Cervical back: Neck supple.  Skin:    Comments: Right axilla with approximately 4 cm x 2 cm erythematous tender fluctuant mass with what appears to be irregular shaped linear extension of the mass.  Neurological:     Mental Status: She is alert.          03/12/2022    9:32 AM  Depression screen PHQ 2/9  Decreased Interest 0  Down, Depressed, Hopeless 0  PHQ - 2 Score 0  Altered sleeping 0  Tired, decreased energy 0  Change in appetite 0  Feeling bad or failure about yourself  0  Trouble concentrating 0  Moving slowly or fidgety/restless 0  Suicidal thoughts 0  PHQ-9 Score 0     {Show previous vital signs (optional):23777}    ASSESSMENT/PLAN:   Suppurative hidradenitis Acute flare of HS/abscess of right axilla worsening despite treatment with doxycycline. Based on exam, there is possible tunneling which I believe would may surgical intervention. Patient prefers surgical drainage  of the mass under anesthesia. - urgent referral to general surgery - switch antibiotics to TMP-SMX 800-160 mg BID x 10 additional days, d/c doxycycline - short course of naproxen prn   Will coordinate with our referral coordinator to get an urgent appointment early this week  Return if symptoms worsen or fail to improve.   Zola Button, MD [place]

## 2022-03-12 NOTE — Patient Instructions (Addendum)
It was nice seeing you today!  Stop taking doxycycline. Take Bactrim twice a day for 10 days.  Urgent referral to surgery. They will give you a call for appointment.  Take naproxen twice a day as needed for pain. Do not take ibuprofen with this.  Stay well, Zola Button, MD [place] [phone]  --  Make sure to check out at the front desk before you leave today.  Please arrive at least 15 minutes prior to your scheduled appointments.  If you had blood work today, I will send you a MyChart message or a letter if results are normal. Otherwise, I will give you a call.  If you had a referral placed, they will call you to set up an appointment. Please give [place] a call if you don't hear back in the next 2 weeks.  If you need additional refills before your next appointment, please call your pharmacy first.

## 2022-03-12 NOTE — Assessment & Plan Note (Signed)
Acute flare of HS/abscess of right axilla worsening despite treatment with doxycycline. Based on exam, there is possible tunneling which I believe would may surgical intervention. Patient prefers surgical drainage of the mass under anesthesia. - urgent referral to general surgery - switch antibiotics to TMP-SMX 800-160 mg BID x 10 additional days, d/c doxycycline - short course of naproxen prn

## 2022-04-16 ENCOUNTER — Encounter: Payer: Self-pay | Admitting: Family Medicine

## 2022-04-16 ENCOUNTER — Ambulatory Visit (INDEPENDENT_AMBULATORY_CARE_PROVIDER_SITE_OTHER): Payer: Self-pay | Admitting: Family Medicine

## 2022-04-16 VITALS — BP 118/74 | HR 100 | Wt 161.0 lb

## 2022-04-16 DIAGNOSIS — E782 Mixed hyperlipidemia: Secondary | ICD-10-CM

## 2022-04-16 DIAGNOSIS — L732 Hidradenitis suppurativa: Secondary | ICD-10-CM

## 2022-04-16 NOTE — Assessment & Plan Note (Signed)
Check lipid panel  

## 2022-04-16 NOTE — Assessment & Plan Note (Addendum)
Right axillary mass which is chronic has improved with antibiotics, does not currently appear infected.  Advised against incision and drainage as there is high likelihood of recurrence.  Advised to get back in with general surgery when she is able to.

## 2022-04-17 ENCOUNTER — Encounter: Payer: Self-pay | Admitting: Family Medicine

## 2022-04-17 LAB — LIPID PANEL
Chol/HDL Ratio: 4.1 ratio (ref 0.0–4.4)
Cholesterol, Total: 232 mg/dL — ABNORMAL HIGH (ref 100–199)
HDL: 56 mg/dL (ref >39)
LDL Chol Calc (NIH): 139 mg/dL — ABNORMAL HIGH (ref 0–99)
Triglycerides: 208 mg/dL — ABNORMAL HIGH (ref 0–149)
VLDL Cholesterol Cal: 37 mg/dL (ref 5–40)

## 2022-06-04 ENCOUNTER — Ambulatory Visit: Payer: Medicaid Other | Admitting: Family Medicine

## 2022-06-11 ENCOUNTER — Encounter: Payer: Self-pay | Admitting: Student

## 2022-06-11 ENCOUNTER — Ambulatory Visit (INDEPENDENT_AMBULATORY_CARE_PROVIDER_SITE_OTHER): Payer: Self-pay | Admitting: Student

## 2022-06-11 VITALS — BP 120/81 | HR 64 | Ht 65.0 in | Wt 161.2 lb

## 2022-06-11 DIAGNOSIS — R5383 Other fatigue: Secondary | ICD-10-CM | POA: Insufficient documentation

## 2022-06-11 DIAGNOSIS — K649 Unspecified hemorrhoids: Secondary | ICD-10-CM

## 2022-06-11 NOTE — Assessment & Plan Note (Signed)
Patient with history of hemorrhoids requiring banding in the past.  Reports one-time bright red blood in the stool couple of weeks ago that has since resolved and palpable hemorrhoids.  Currently asymptomatic without any hematochezia or pain.  He is also due for colonoscopy. -Placed GI referral to [place] for colonoscopy and hemorrhoid management.

## 2022-06-11 NOTE — Patient Instructions (Addendum)
It was wonderful to meet you today. Thank you for allowing me to be a part of your care. Below is a short summary of what we discussed at your visit today:  Placed a GI referral for your colonoscopy and hemorrhoid.  Obtained lab to check your thyroid levels and blood counts due to your concerns for generalized fatigue.  Recommend continued exercise and dieting improve your cholesterol.  Please bring all of your medications to every appointment!  If you have any questions or concerns, please do not hesitate to contact us via phone or MyChart message.   Alen Bleacher, MD [facility]

## 2022-06-11 NOTE — Progress Notes (Signed)
    SUBJECTIVE:   CHIEF COMPLAINT / HPI:   56 year old female with history of T2DM and Hemorrhoids presents today for referal to GI for her colonoscopy that's due and hemorrhoid management. She reports she's been seen in the pass by GI for her hemorrhoids and wants a referral to GI. Denies any active bleeding but had one-time bright red blood in stool couple of weeks ago. Has palpable hemorroid but BM has been normal.  Patient report she's due for colonoscopy. Last colonoscopy was normal other than the hemorrhoid which was banded the last time.   Reports low energy for weeks. Works 4-5 hrs a day but stiill feel fatigued. Denies any fevers, chills or cold intolerance. No significant weight loss or gain but report weight fluctuates. Stopped using caffeine and takes multivitamin but thinks it doesn't happen.  Sleeps habit involves long nap during the day, and sleeps through the night about 8hrs. Patient reports her mom has history of thyroid disease.  Stopped smoking 12 years ago with Chantix.  PERTINENT  PMH / PSH: Polar disorder, T2DM, hemorrhoid  OBJECTIVE:   BP 120/81   Pulse 64   Ht '5\' 5"'$  (1.651 m)   Wt 161 lb 3.2 oz (73.1 kg)   SpO2 100%   BMI 26.83 kg/m    Physical Exam General: Alert, well appearing, NAD Cardiovascular: RRR, No Murmurs, Normal S2/S2 Respiratory: CTAB, No wheezing or Rales Abdomen: No distension or tenderness Extremities: No edema on extremities   Skin: Warm, dry, well perfused  ASSESSMENT/PLAN:   Hemorrhoids Patient with history of hemorrhoids requiring banding in the past.  Reports one-time bright red blood in the stool couple of weeks ago that has since resolved and palpable hemorrhoids.  Currently asymptomatic without any hematochezia or pain.  He is also due for colonoscopy. -Placed GI referral to [facility] [facility] for colonoscopy and hemorrhoid management.  Fatigue Patient reports prolonged fatigue.  Broad differential include anemia, thyroid disease,  viral illness and Bipolar depression.  Concerns for viral illness given extended length of generalized fatigue and absence of viral symptoms such as fever or chills.  Anemia considered given patient's generalized fatigue and report of hematochezia.  Thyroid disease considered given family history and generalized fatigue in the setting of bipolar 1 depression. -Obtained labs for TSH and CBC with differentials -Encourage patients to see her psychiatrist who have manage her bipolar disorder in the past. -Encouraged patient to continue diet and exercise.     Alen Bleacher, MD [place]

## 2022-06-11 NOTE — Assessment & Plan Note (Signed)
Patient reports prolonged fatigue.  Broad differential include anemia, thyroid disease, viral illness and Bipolar depression.  Concerns for viral illness given extended length of generalized fatigue and absence of viral symptoms such as fever or chills.  Anemia considered given patient's generalized fatigue and report of hematochezia.  Thyroid disease considered given family history and generalized fatigue in the setting of bipolar 1 depression. -Obtained labs for TSH and CBC with differentials -Encourage patients to see her psychiatrist who have manage her bipolar disorder in the past. -Encouraged patient to continue diet and exercise.

## 2022-06-12 ENCOUNTER — Telehealth: Payer: Self-pay | Admitting: Student

## 2022-06-12 LAB — CBC
Hematocrit: 40.5 % (ref 34.0–46.6)
Hemoglobin: 13.2 g/dL (ref 11.1–15.9)
MCH: 26.8 pg (ref 26.6–33.0)
MCHC: 32.6 g/dL (ref 31.5–35.7)
MCV: 82 fL (ref 79–97)
Platelets: 230 10*3/uL (ref 150–450)
RBC: 4.92 x10E6/uL (ref 3.77–5.28)
RDW: 13.8 % (ref 11.7–15.4)
WBC: 4.9 10*3/uL (ref 3.4–10.8)

## 2022-06-12 LAB — TSH: TSH: 1.72 u[IU]/mL (ref 0.450–4.500)

## 2022-06-12 NOTE — Telephone Encounter (Signed)
Called patient to inform her that her CBC and TSH were normal.

## 2022-07-24 ENCOUNTER — Ambulatory Visit: Payer: Medicaid Other | Admitting: Student

## 2022-07-31 ENCOUNTER — Encounter: Payer: Self-pay | Admitting: Student

## 2022-07-31 ENCOUNTER — Ambulatory Visit (INDEPENDENT_AMBULATORY_CARE_PROVIDER_SITE_OTHER): Payer: Self-pay | Admitting: Student

## 2022-07-31 VITALS — BP 121/92 | HR 93 | Ht 65.0 in | Wt 159.8 lb

## 2022-07-31 DIAGNOSIS — F319 Bipolar disorder, unspecified: Secondary | ICD-10-CM

## 2022-07-31 DIAGNOSIS — L732 Hidradenitis suppurativa: Secondary | ICD-10-CM

## 2022-07-31 DIAGNOSIS — R052 Subacute cough: Secondary | ICD-10-CM

## 2022-07-31 DIAGNOSIS — Z23 Encounter for immunization: Secondary | ICD-10-CM

## 2022-07-31 MED ORDER — CETIRIZINE HCL 10 MG PO TABS: 10.0000 mg | ORAL_TABLET | Freq: Every day | ORAL | 11 refills | Status: DC

## 2022-07-31 MED ORDER — CLINDAMYCIN PHOSPHATE 1 % EX GEL: Freq: Two times a day (BID) | CUTANEOUS | 0 refills | Status: DC

## 2022-07-31 MED ORDER — FLUTICASONE PROPIONATE 50 MCG/ACT NA SUSP: 2.0000 | Freq: Every day | NASAL | 6 refills | Status: DC

## 2022-08-01 DIAGNOSIS — R052 Subacute cough: Secondary | ICD-10-CM | POA: Insufficient documentation

## 2022-08-01 NOTE — Progress Notes (Signed)
SUBJECTIVE:   CHIEF COMPLAINT / HPI:   Cough Patient here with cough, congestion, and rhinorrhea for about four weeks. Has been afebrile and otherwise feeling well. Has not taken anything for this. Does have a history of seasonal allergies typically managed with OTC options but has not been taking any this season. Smoked cigarettes for 27 years but quit 15 years ago. Still smokes marijuana daily.   Bipolar Disorder Discussed PHQ-9 score of 15 with answer of 1 on question 9. Patient reports that she is at her baseline and feels well. She has occasional intrusive thoughts of self-harm/suicide by running into traffic or driving her car of the road but does not feel as though she would ever act on these. Enjoys life too much, particularly enjoys going out dancing with her friends on the weekends. Cannabis is helpful in managing her symptoms.    OBJECTIVE:   BP (!) 121/92   Pulse 93   Ht '5\' 5"'$  (1.651 m)   Wt 159 lb 12.8 oz (72.5 kg)   SpO2 100%   BMI 26.59 kg/m   Physical Exam Constitutional:      General: She is not in acute distress. HENT:     Nose: Congestion and rhinorrhea present.     Mouth/Throat:     Mouth: Mucous membranes are moist.     Pharynx: Posterior oropharyngeal erythema present. No oropharyngeal exudate.  Eyes:     Conjunctiva/sclera: Conjunctivae normal.     Pupils: Pupils are equal, round, and reactive to light.  Cardiovascular:     Rate and Rhythm: Normal rate and regular rhythm.     Heart sounds: No murmur heard. Pulmonary:     Effort: Pulmonary effort is normal. No respiratory distress.     Breath sounds: No wheezing or rales.  Musculoskeletal:     Cervical back: Neck supple.  Lymphadenopathy:     Cervical: No cervical adenopathy.  Skin:    Findings: No rash.  Psychiatric:        Mood and Affect: Mood normal.       07/31/2022    2:50 PM 06/11/2022   11:03 AM 04/16/2022    2:18 PM 03/12/2022    9:32 AM 03/07/2022    2:39 PM  Depression screen  PHQ 2/9  Decreased Interest 2 1 0 0 1  Down, Depressed, Hopeless 2 1 0 0 1  PHQ - 2 Score 4 2 0 0 2  Altered sleeping 2 1 0 0 1  Tired, decreased energy 2 1 0 0 1  Change in appetite 2 1 0 0 1  Feeling bad or failure about yourself  0 0 0 0 0  Trouble concentrating 2 1 0 0 1  Moving slowly or fidgety/restless 2 1 0 0 1  Suicidal thoughts 1 0 0 0 0  PHQ-9 Score 15 7 0 0 7  Difficult doing work/chores Not difficult at all          ASSESSMENT/PLAN:   Subacute cough Suspect 2/2 postnasal drip and related to seasonal allergies which are presently not being treated. Also suspect that smoking cannabis is not helping as this is introducing irritants into her lungs. Lung exam today is reassuringly clear.  - Will treat allergies/postnasal drip with Zyrtec/Flonase - Counseled on reducing smoke inhalation--patient to consider THC edibles instead  Bipolar I disorder with depression ([place]) At baseline. Euthymic today. Mood and affect congruent. Contracts for safety. Declines pharmacotherapy. Continue to monitor.      Pearla Dubonnet, MD  [place]

## 2022-08-01 NOTE — Assessment & Plan Note (Signed)
Suspect 2/2 postnasal drip and related to seasonal allergies which are presently not being treated. Also suspect that smoking cannabis is not helping as this is introducing irritants into her lungs. Lung exam today is reassuringly clear.  - Will treat allergies/postnasal drip with Zyrtec/Flonase - Counseled on reducing smoke inhalation--patient to consider THC edibles instead

## 2022-08-01 NOTE — Assessment & Plan Note (Signed)
At baseline. Euthymic today. Mood and affect congruent. Contracts for safety. Declines pharmacotherapy. Continue to monitor.

## 2022-08-20 ENCOUNTER — Ambulatory Visit (HOSPITAL_COMMUNITY)
Admission: EM | Admit: 2022-08-20 | Discharge: 2022-08-20 | Disposition: A | Discharge status: Home / Self Care | Payer: No Payment, Other

## 2022-08-20 ENCOUNTER — Ambulatory Visit (INDEPENDENT_AMBULATORY_CARE_PROVIDER_SITE_OTHER): Payer: No Payment, Other | Admitting: Licensed Clinical Social Worker

## 2022-08-20 DIAGNOSIS — F1210 Cannabis abuse, uncomplicated: Secondary | ICD-10-CM | POA: Diagnosis present

## 2022-08-20 DIAGNOSIS — Z789 Other specified health status: Secondary | ICD-10-CM | POA: Diagnosis present

## 2022-08-20 DIAGNOSIS — F3130 Bipolar disorder, current episode depressed, mild or moderate severity, unspecified: Secondary | ICD-10-CM | POA: Diagnosis not present

## 2022-08-20 DIAGNOSIS — F411 Generalized anxiety disorder: Secondary | ICD-10-CM

## 2022-08-20 DIAGNOSIS — F1290 Cannabis use, unspecified, uncomplicated: Secondary | ICD-10-CM | POA: Diagnosis present
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
## 2022-08-20 NOTE — Discharge Instructions (Addendum)
Dear Kristina Moreno,  Most effective treatment for your mental health disease involves BOTH a psychiatrist AND a therapist Psychiatrist to manage medications Therapist to help identify personal goals, barriers from those goals, and plan to achieve those goals by understanding emotions Please make regular appointments with an outpatient psychiatrist and other doctors once you leave the hospital (if any, otherwise, please see below for resources to make an appointment).  For therapy outside the hospital, please ask for these specific types of therapy: DBT ________________________________________________________  SAFETY CRISIS  Dial 988 for [place]    Text [phone] for Crisis Text Line:     [facility]:  [address] FIRST FLOOR.  [place], [place] [address].  [phone]  Mobile Crisis Response Teams Listed by counties in vicinity of [place]. [phone] [place] [phone] [place] [phone] [facility] [phone] [place] [phone] [place]. [phone] [place].  [place] [phone] ________________________________________________________  To see which pharmacy near you is the CHEAPEST for certain medications, please use GoodRx. It is free website and has a free phone app.    Also consider looking at [facility] $4.00 or [facility]'s $7.00 prescription list. Both are free to view if googled "[facility] $4 prescription" and "public's $7 prescription". These are set prices, no insurance required. [facility]'s low cost medications: $4-$15 for 30days  prescriptions or $10-$38 for 90days prescriptions  ________________________________________________________  Difficulties with sleep?   Can also use this free app for insomnia called CBT-I. Let your doctors and therapists know so they can help with extra tips and tricks or for guidance and accountability. NO ADDS on the app.     ________________________________________________________  Non-Emergent / Urgent  [facility] [address] [place], [place] [address] [phone] OUTPATIENT Walk-in information: Please note, all walk-ins are first come & first serve, with limited number of availability.  Please note that to be eligible for services you must bring: ID or a piece of mail with your name [facility] address  Therapist for therapy:  Monday & Wednesdays: Please ARRIVE at 7:15 AM for registration Will START at 8:00 AM Every 1st & 2nd Friday of the month: Please ARRIVE at 10:15 AM for registration Will START at 1 PM - 5 PM  Psychiatrist for medication management: Monday - Friday:  Please ARRIVE at 7:15 AM for registration Will START at 8:00 AM  Regretfully, due to limited availability, please be aware that you may not been seen on the same day as walk-in. Please consider making an appoint or try again. Thank you for your patience and understanding.     Other locations to try:        [place], [place], [place] [address]      [phone]        [facility]      [address] [place], [place], [address]      [phone] phone      [phone] fax  Psychiatrist at [facility]      [place], [place], [address]      [phone] phone to set up initial appointment       [place]     [phone]     [place], [place], [address]     [phone] phone     [phone]  fax  PSYCHIATRY/THERAPY WALK-IN  [facility], [place] [address] [phone]  New patients are seen at their walk-in clinic. Walk-in hours are Monday - Friday from 8:30 am - 12:00 pm, and from 1:00 pm - 2:30 pm.   Walk-in patients are seen on a first come, first served basis, so try to arrive as early as possible for the best chance of being seen the same day.   TRANSPORTATION To get a ride to your medical appointment, call your your Non-Emergency Medical Transportation [facility]) provider. Your NEMT provider is based on the health plan you are enrolled in. When your appointment is made, please call your NEMT provider the same day, to schedule your ride.  [place] Medicaid Direct and EBCI [employer] Services (DSS) to schedule a ride. DSS Transportation [address] [place] [address] [phone]  Others #s: [phone] Office Hours 8:00 am to 5:00 pm Monday - Friday     [facility] [phone]  [facility] Complete [phone]  [facility] [place] [phone]  [place] [phone]  [facility] [phone]   [employer] - Another resource for a transport system that serves several counties. [email or web address]
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
## 2022-08-29 ENCOUNTER — Ambulatory Visit (HOSPITAL_COMMUNITY): Payer: No Payment, Other | Admitting: Licensed Clinical Social Worker

## 2022-08-30 ENCOUNTER — Ambulatory Visit (HOSPITAL_COMMUNITY): Payer: Commercial Managed Care - HMO | Admitting: Licensed Clinical Social Worker

## 2022-08-31 ENCOUNTER — Telehealth (HOSPITAL_COMMUNITY): Payer: Self-pay | Admitting: Psychiatry
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
## 2022-09-10 NOTE — Patient Instructions (Signed)
Thank you for attending your appointment today.  -- START Zoloft 25 mg daily for 6 days then increase to 50 mg daily thereafter -- START Atarax 25 mg up to twice a day as needed for anxiety. May take additional 25-50 mg nightly as needed for sleep. -- Continue other medications as prescribed.  Please do not make any changes to medications without first discussing with your provider. If you are experiencing a psychiatric emergency, please call 911 or present to your nearest emergency department. Additional crisis, medication management, and therapy resources are included below.  [facility]  [address], [place], [place] [address] [phone] WALK-IN URGENT CARE 24/7 FOR ANYONE [address], [place], [place] Fax: [phone] [email or web address] *Interpreters available *Accepts all insurance and uninsured for Urgent Care needs *Accepts Medicaid and uninsured for outpatient treatment (below)      ONLY FOR [facility]  Below:    Outpatient New Patient Assessment/Therapy Walk-ins:        Monday -Thursday 8am until slots are full.        Every Friday 1pm-4pm  (first come, first served)                   New Patient Psychiatry/Medication Management        Monday-Friday 8am-11am (first come, first served)               For all walk-ins we ask that you arrive by 7:15am, because patients will be seen in the order of arrival.

## 2022-09-11 ENCOUNTER — Encounter (HOSPITAL_COMMUNITY): Payer: Self-pay | Admitting: Psychiatry

## 2022-09-11 ENCOUNTER — Other Ambulatory Visit: Payer: Self-pay

## 2022-09-11 ENCOUNTER — Ambulatory Visit (INDEPENDENT_AMBULATORY_CARE_PROVIDER_SITE_OTHER): Payer: No Payment, Other | Admitting: Psychiatry

## 2022-09-11 DIAGNOSIS — F4310 Post-traumatic stress disorder, unspecified: Secondary | ICD-10-CM | POA: Diagnosis not present

## 2022-09-11 DIAGNOSIS — F1210 Cannabis abuse, uncomplicated: Secondary | ICD-10-CM | POA: Diagnosis not present

## 2022-09-11 DIAGNOSIS — F331 Major depressive disorder, recurrent, moderate: Secondary | ICD-10-CM

## 2022-09-11 MED ORDER — SERTRALINE HCL 50 MG PO TABS
ORAL_TABLET | ORAL | 2 refills | Status: DC
  Filled 2022-09-11: qty 30, 30d supply, fill #0
  Filled 2022-10-09: qty 30, 30d supply, fill #1

## 2022-09-11 MED ORDER — HYDROXYZINE HCL 25 MG PO TABS
25.0000 mg | ORAL_TABLET | Freq: Two times a day (BID) | ORAL | 0 refills | Status: DC | PRN
  Filled 2022-09-11: qty 90, 22d supply, fill #0

## 2022-09-17 ENCOUNTER — Ambulatory Visit: Payer: Medicaid Other | Admitting: Gastroenterology

## 2022-09-24 ENCOUNTER — Ambulatory Visit (INDEPENDENT_AMBULATORY_CARE_PROVIDER_SITE_OTHER): Payer: Commercial Managed Care - HMO | Admitting: Licensed Clinical Social Worker

## 2022-09-24 DIAGNOSIS — F331 Major depressive disorder, recurrent, moderate: Secondary | ICD-10-CM | POA: Diagnosis not present

## 2022-09-24 NOTE — Progress Notes (Signed)
   THERAPIST PROGRESS NOTE  Session Time: 40 minutes  Participation Level: Active  Behavioral Response: NeatAlertEuthymic  Type of Therapy: Individual Therapy  Treatment Goals addressed: Depressive Symptoms  ProgressTowards Goals: Initial  Interventions: CBT  Summary: Kristina Moreno is a 56 y.o. female who presents with symptoms of depression. She reports that she has been working a lot and is thinking about having a one day break in between to give her some time to relax. She discussed a conflict about a parking space with a neighbor and she discussed how she wanted to resolve it. She discussed the cause of her depression is with one of her sons and his behavior and issues with anger. She reports that she takes ownership of it because she is his parent. [name] identified ways she can cope with these symptoms and letting go of responsibility. She identified going for walks helps and visiting her mother.   Suicidal/Homicidal: Nowithout intent/plan  Therapist Response: Mental status evaluated. Assessed for issues of dangerousness. Informed client of Clinician leaving office and plan to follow up in the next week, then transition to another therapist in office, Ms. Eberwein is agreeable to this.   Plan: Return again in 1 weeks.  Diagnosis: Major depressive disorder, recurrent episode, moderate with anxious distress ([place])   Collaboration of Care: Medication Management AEB 10/09/2022  Patient/Guardian was advised Release of Information must be obtained prior to any record release in order to collaborate their care with an outside provider. Patient/Guardian was advised if they have not already done so to contact the registration department to sign all necessary forms in order for [place] to release information regarding their care.   Consent: Patient/Guardian gives verbal consent for treatment and assignment of benefits for services provided during this visit. Patient/Guardian expressed  understanding and agreed to proceed.   Allyson Sabal, [facility] 09/24/2022

## 2022-10-01 ENCOUNTER — Ambulatory Visit (INDEPENDENT_AMBULATORY_CARE_PROVIDER_SITE_OTHER): Payer: Commercial Managed Care - HMO | Admitting: Licensed Clinical Social Worker

## 2022-10-01 DIAGNOSIS — F331 Major depressive disorder, recurrent, moderate: Secondary | ICD-10-CM | POA: Diagnosis not present

## 2022-10-01 NOTE — Progress Notes (Signed)
   THERAPIST PROGRESS NOTE  Session Time: 30 minutes  Participation Level: Active  Behavioral Response: MeticulousAlertEuthymic  Type of Therapy: Individual Therapy  Treatment Goals addressed: Depression symptoms, intrusive thoughts  ProgressTowards Goals: Not Progressing  Interventions: CBT and Supportive  Summary: Kristina Moreno is a 56 y.o. female who presents with symptoms of depressive symptoms. [name] was seen last week and reports that her weekend went well and she went on a date. She reports that she had been feeling better with her mood and then today she reports feeling down again. She states that she does not know the cause of this and reports that she has been taking the full tablet of Zoloft as prescribed. She reports that she has been having a lot of thoughts, and has decreased marijuana use, and her appetite has decreased also. She reports that she plans to spend the rest of her day day relaxing and going thrift shopping and getting something for dinner. She reports that she may decide to go for a walk.    Suicidal/Homicidal: Nowithout intent/plan  Therapist Response: Therapist provided active listening to client. Mental status evaluated. Assessed for issues of dangerousness. Identified coping skills to try to use and encouraged medication compliance and speaking with provider next week during appt about negative symptoms increasing with medication.  Plan: Return again in 3 weeks.  Diagnosis: Major depressive disorder, recurrent episode, moderate with anxious distress ([place])   Collaboration of Care: Medication Management AEB 10/09/2022  Patient/Guardian was advised Release of Information must be obtained prior to any record release in order to collaborate their care with an outside provider. Patient/Guardian was advised if they have not already done so to contact the registration department to sign all necessary forms in order for [place] to release information regarding  their care.   Consent: Patient/Guardian gives verbal consent for treatment and assignment of benefits for services provided during this visit. Patient/Guardian expressed understanding and agreed to proceed.   Allyson Sabal, [facility] 10/01/2022

## 2022-10-08 ENCOUNTER — Ambulatory Visit: Payer: Medicaid Other | Admitting: Student

## 2022-10-08 VITALS — BP 122/93 | HR 66 | Ht 65.0 in | Wt 160.2 lb

## 2022-10-08 DIAGNOSIS — R42 Dizziness and giddiness: Secondary | ICD-10-CM | POA: Diagnosis present

## 2022-10-08 NOTE — Progress Notes (Signed)
SUBJECTIVE:   CHIEF COMPLAINT / HPI:   Dizziness Yesterday night she had felt very warm while watching TV and felt like she was feeling very lightheaded and felt like she needed to go to the bathroom immediately.  She felt like she may have peed on herself but she did not end she really needed to have a bowel movement and was able to make it to the bathroom and had a normal bowel movement after this.  Denies any diarrhea/looser stool.  She says that in the past she has had similar events where she has felt dizzy and hot like a panic attack after she had drink alcohol before.  Says it did not feel similar to panic attacks that she has gotten in the past.  She then went to her bedroom and got onto her bed and said that she "passed out".  She says she is unsure if she lost consciousness or she just slept for 4 hours.  Around the same time that this event had happened she had smoked a little bit of marijuana.  She says that for the past month that she is recently started taking Zoloft she has essentially not done any marijuana.  She was started on Zoloft in November at 25 mg and has uptitrated herself to 75 mg for the past 1 to 2 weeks.  She has not been eating as much is her normal. Had been diagnosed with meniere disease in the past by ENT at [facility] [place] [place] and was started on HCTZ at that time but currently not on this.  Ears sometimes bother her but none yesterday (sometimes hears high pitched noise).  No heart racing or chest pain prior to event yesterday. No diarrhea or nausea. Sinuses been draining-and she has been coughing. No fevers that she knows of.  Drinking her normal amount.   PERTINENT  PMH / PSH: hx of meniere's disease  OBJECTIVE:   BP (!) 122/93   Pulse 66   Ht '5\' 5"'$  (1.651 m)   Wt 160 lb 3.2 oz (72.7 kg)   SpO2 100%   BMI 26.66 kg/m   General: Nontoxic, NAD, awake, alert, responsive to questions Head: Normocephalic atraumatic, TM and canals clear bilaterally CV: Regular rate  and rhythm no murmurs rubs or gallops Respiratory: Clear to ausculation bilaterally, no wheezes rales or crackles, chest rises symmetrically,  no increased work of breathing Abdomen: Soft, non-tender, non-distended, normoactive bowel sounds  Extremities: Moves upper and lower extremities freely, no edema in LE Neuro: CN II: PERRL CN III, IV,VI: EOMI (1-2 beats of horizontal nystagmus) CV V: Normal sensation in V1, V2, V3 CVII: Symmetric smile and brow raise CN VIII: Normal hearing CN IX,X: Symmetric palate raise  CN XI: 5/5 shoulder shrug CN XII: Symmetric tongue protrusion  UE and LE strength 5/5 Normal sensation in UE and LE bilaterally   ASSESSMENT/PLAN:   Dizziness Unclear cause for event yesterday night.  Differential includes dizzy spell after marijuana consumption given patient has been off of this for a while.  Patient has been uptitrating Zoloft unsure if this could be a side effect from this.  Patient has some sinus drainage could be component of a viral illness.  Panic attack possibility as well.  Orthostatic vitals within normal limits.  Less likely to be Mnire's although she does have a history of this does not sound like a typical attack.  Less likely causes are anemia, diabetes, electrolyte abnormalities but we will obtain labs to assess for this.  Less likely to be cardiac in nature given history.  Less likely to be seizure given no history of seizures/no abnormal movements. -Discussed with patient to avoid marijuana -Discussed with patient not to uptitrate her Zoloft and continue current dose -Discussed increasing hydration -CBC, BMP, A1c   Gerrit Heck, MD [place]

## 2022-10-08 NOTE — Patient Instructions (Signed)
It was great to see you! Thank you for allowing me to participate in your care!   I recommend that you always bring your medications to each appointment as this makes it easy to ensure we are on the correct medications and helps [place] not miss when refills are needed.  Our plans for today:  -Please avoid marijuana use -Do not increase Zoloft at this time -Please hydrate a lot in the mean time  We are checking some labs today, I will call you if they are abnormal will send you a MyChart message or a letter if they are normal.  If you do not hear about your labs in the next 2 weeks please let us know.  Take care and seek immediate care sooner if you develop any concerns. Please remember to show up 15 minutes before your scheduled appointment time!  Gerrit Heck, MD [place]

## 2022-10-08 NOTE — Assessment & Plan Note (Signed)
Unclear cause for event yesterday night.  Differential includes dizzy spell after marijuana consumption given patient has been off of this for a while.  Patient has been uptitrating Zoloft unsure if this could be a side effect from this.  Patient has some sinus drainage could be component of a viral illness.  Panic attack possibility as well.  Orthostatic vitals within normal limits.  Less likely to be Mnire's although she does have a history of this does not sound like a typical attack.  Less likely causes are anemia, diabetes, electrolyte abnormalities but we will obtain labs to assess for this.  Less likely to be cardiac in nature given history.  Less likely to be seizure given no history of seizures/no abnormal movements. -Discussed with patient to avoid marijuana -Discussed with patient not to uptitrate her Zoloft and continue current dose -Discussed increasing hydration -CBC, BMP, A1c

## 2022-10-09 ENCOUNTER — Other Ambulatory Visit: Payer: Self-pay

## 2022-10-09 ENCOUNTER — Encounter (HOSPITAL_COMMUNITY): Payer: Self-pay | Admitting: Psychiatry

## 2022-10-09 ENCOUNTER — Ambulatory Visit (INDEPENDENT_AMBULATORY_CARE_PROVIDER_SITE_OTHER): Payer: Commercial Managed Care - HMO | Admitting: Psychiatry

## 2022-10-09 VITALS — BP 139/99 | HR 63 | Ht 65.0 in | Wt 158.4 lb

## 2022-10-09 DIAGNOSIS — F331 Major depressive disorder, recurrent, moderate: Secondary | ICD-10-CM

## 2022-10-09 DIAGNOSIS — F1290 Cannabis use, unspecified, uncomplicated: Secondary | ICD-10-CM | POA: Diagnosis not present

## 2022-10-09 DIAGNOSIS — F1491 Cocaine use, unspecified, in remission: Secondary | ICD-10-CM | POA: Diagnosis not present

## 2022-10-09 DIAGNOSIS — F4310 Post-traumatic stress disorder, unspecified: Secondary | ICD-10-CM

## 2022-10-09 DIAGNOSIS — F419 Anxiety disorder, unspecified: Secondary | ICD-10-CM

## 2022-10-09 MED ORDER — FLUOXETINE HCL 20 MG PO CAPS
20.0000 mg | ORAL_CAPSULE | Freq: Every day | ORAL | 1 refills | Status: DC
  Filled 2022-10-09: qty 30, 30d supply, fill #0
  Filled 2022-11-07: qty 30, 30d supply, fill #1

## 2022-10-09 MED ORDER — HYDROXYZINE HCL 25 MG PO TABS
25.0000 mg | ORAL_TABLET | Freq: Two times a day (BID) | ORAL | 1 refills | Status: DC | PRN
  Filled 2022-10-09: qty 90, 23d supply, fill #0
  Filled 2022-11-07: qty 90, 23d supply, fill #1
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
## 2022-10-09 NOTE — Patient Instructions (Addendum)
Thank you for attending your appointment today.  -- SWITCH from Zoloft to Prozac   Week 1: DECREASE Zoloft to 50 mg daily  Week 2: STOP Zoloft. START Prozac 20 mg daily -- Continue other medications as prescribed.  Please do not make any changes to medications without first discussing with your provider. If you are experiencing a psychiatric emergency, please call 911 or present to your nearest emergency department. Additional crisis, medication management, and therapy resources are included below.  [facility]  [address] [place], [place] [address] [phone] WALK-IN URGENT CARE 24/7 FOR ANYONE [address] [place], [place] Fax: [phone] [email or web address] *Interpreters available *Accepts all insurance and uninsured for Urgent Care needs *Accepts Medicaid and uninsured for outpatient treatment (below)      ONLY FOR [facility]  Below:    Outpatient New Patient Assessment/Therapy Walk-ins:        Monday -Thursday 8am until slots are full.        Every Friday 1pm-4pm  (first come, first served)                   New Patient Psychiatry/Medication Management        Monday-Friday 8am-11am (first come, first served)               For all walk-ins we ask that you arrive by 7:15am, because patients will be seen in the order of arrival.

## 2022-10-10 ENCOUNTER — Other Ambulatory Visit: Payer: Self-pay

## 2022-10-11 ENCOUNTER — Other Ambulatory Visit: Payer: Self-pay

## 2022-10-11 LAB — CBC
Hematocrit: 43.3 % (ref 34.0–46.6)
Hemoglobin: 13.8 g/dL (ref 11.1–15.9)
MCH: 26.8 pg (ref 26.6–33.0)
MCHC: 31.9 g/dL (ref 31.5–35.7)
MCV: 84 fL (ref 79–97)
Platelets: 239 10*3/uL (ref 150–450)
RBC: 5.15 x10E6/uL (ref 3.77–5.28)
RDW: 13.3 % (ref 11.7–15.4)
WBC: 8 10*3/uL (ref 3.4–10.8)

## 2022-10-11 LAB — BASIC METABOLIC PANEL
BUN/Creatinine Ratio: 23 (ref 9–23)
BUN: 19 mg/dL (ref 6–24)
CO2: 22 mmol/L (ref 20–29)
Calcium: 9.7 mg/dL (ref 8.7–10.2)
Chloride: 104 mmol/L (ref 96–106)
Creatinine, Ser: 0.82 mg/dL (ref 0.57–1.00)
Glucose: 98 mg/dL (ref 70–99)
Potassium: 4.3 mmol/L (ref 3.5–5.2)
Sodium: 142 mmol/L (ref 134–144)
eGFR: 83 mL/min/{1.73_m2} (ref >59)

## 2022-10-12 ENCOUNTER — Encounter: Payer: Self-pay | Admitting: Student

## 2022-10-26 ENCOUNTER — Ambulatory Visit: Payer: Self-pay | Admitting: Physician Assistant

## 2022-10-26 ENCOUNTER — Telehealth: Payer: Self-pay

## 2022-10-26 NOTE — Telephone Encounter (Signed)
Patient calls nurse line requesting a new referral for gastro.   She reports Kristina Moreno does not take her insurance.   She reports she would like to go [place] [facility] on [address]. She has spoken to them already today. She reports having a [facility] now.   Patient asks referral be faxed as soon as possible to [phone].  Will forward to referral coordinator.

## 2022-10-26 NOTE — Telephone Encounter (Signed)
Referral faxed to [facility].  Joshwa Hemric,CMA

## 2022-10-31 ENCOUNTER — Ambulatory Visit (INDEPENDENT_AMBULATORY_CARE_PROVIDER_SITE_OTHER): Payer: BLUE CROSS/BLUE SHIELD | Admitting: Mental Health

## 2022-10-31 ENCOUNTER — Encounter (HOSPITAL_COMMUNITY): Payer: Self-pay

## 2022-10-31 DIAGNOSIS — F331 Major depressive disorder, recurrent, moderate: Secondary | ICD-10-CM | POA: Diagnosis not present

## 2022-10-31 DIAGNOSIS — F419 Anxiety disorder, unspecified: Secondary | ICD-10-CM

## 2022-10-31 DIAGNOSIS — F1290 Cannabis use, unspecified, uncomplicated: Secondary | ICD-10-CM
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
## 2022-11-07 ENCOUNTER — Other Ambulatory Visit: Payer: Self-pay

## 2022-11-07 MED ORDER — ZOSTER VAC RECOMB ADJUVANTED 50 MCG/0.5ML IM SUSR
0.5000 mL | Freq: Once | INTRAMUSCULAR | 1 refills | Status: AC
  Filled 2022-11-07: qty 1, 1d supply, fill #0
  Filled 2023-08-05: qty 1, 1d supply, fill #1

## 2022-11-08 ENCOUNTER — Other Ambulatory Visit: Payer: Self-pay

## 2022-11-12 ENCOUNTER — Ambulatory Visit (INDEPENDENT_AMBULATORY_CARE_PROVIDER_SITE_OTHER): Payer: BLUE CROSS/BLUE SHIELD | Admitting: Student

## 2022-11-12 ENCOUNTER — Encounter: Payer: Self-pay | Admitting: Student

## 2022-11-12 VITALS — BP 110/75 | HR 75 | Ht 65.0 in | Wt 165.4 lb

## 2022-11-12 DIAGNOSIS — R400 Somnolence: Secondary | ICD-10-CM | POA: Insufficient documentation

## 2022-11-12 DIAGNOSIS — Z0000 Encounter for general adult medical examination without abnormal findings: Secondary | ICD-10-CM | POA: Diagnosis not present

## 2022-11-12 DIAGNOSIS — R42 Dizziness and giddiness: Secondary | ICD-10-CM | POA: Diagnosis not present

## 2022-11-12 DIAGNOSIS — F458 Other somatoform disorders: Secondary | ICD-10-CM | POA: Diagnosis not present

## 2022-11-12 NOTE — Patient Instructions (Addendum)
Ms. Cwynar, It's always great to see you! I think your naps are throwing off your nighttime sleep. Let's try to tackle this with behavior changes before we throw more medicine at it.  - Try the following to help you sleep better:  - limit naps during the day  - no screens (TV, phone, tablet, computer) at least 1-2 hours before bedtime.  - have a quiet and dark sleeping environment.  - no large meals or drinks about 1 hour before bed.  - Avoid taking diuretics (hydrochlorothiazide, furosemide) in the evenings.  - Exercise or move your body regularly every day.  - You can also try melatonin 10 mg over the counter. Take this 1-2 hours before bed. You can increase to '20mg'$  if this is not helpful. - If you are lying in bed for 30 mins-1 hour and aren't falling asleep, get out of bed and do something relaxing like reading (NO TV!) until you are tired.  We'll do your COVID booster and pelvic exam next Monday at 9:30am.   We will check a sleep study. The sleep lab at [facility] will call you to schedule.   You can buy a mouth guard over the counter at any drug store.   Pearla Dubonnet, MD

## 2022-11-12 NOTE — Progress Notes (Signed)
SUBJECTIVE:   CHIEF COMPLAINT / HPI:   Insomnia  Daytime sleepiness In the setting of depression 2/2 cousin's recent death. Feels like the Prozac is helpful. Now '20mg'$  daily (had self-titrated to Zoloft '75mg'$  daily but back down to 20 of Prozac and feels that this is right dose for her). Has cut back on marijuana, now smoking $80 worth/month, down from $200/month.   Does report being very tired during the day. Naps 2-5p every day.  Gets tired from physical job at work. Cut out caffeine. Does watch TV before bed. Unsure if she snores as she lives alone.  Mom and sister have sleep apnea.  Walks for exercise, has dumbbells at home. Dances at [employer] every weekend.   Grinding Teeth Requesting a mouth guard as she notices that she grits/grinds her teeth when stressed. Is worried about doing this at night. Does get some jaw pain from time to time though none at present. Thinks recent symptoms are due to stress from cousin's death as above.   Dizziness Follow-up Seen by Dr. Herb Grays a few months ago.  At that time symptoms were thought to be secondary to her increased Zoloft dose and cannabis use.  Symptoms have resolved with transition to lower dose Prozac and cutting back on cannabis.  No lingering concerns.  Lab work obtained at that time was not concerning for anemia or metabolic disturbance.  Healthcare Maintenance Wonders whether she would be a good candidate for the RSV vaccine.  Notes that she had her shingles shot last week and asked about RSV vaccine at the pharmacy but they would not give it to her given that she is less than 22.  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  No known pulmonary disease though she does have a history of smoking tobacco products. Willing to get a COVID booster but prefers to wait until next week as her arm is still sore from her shingles vaccine. Would like to come back next week for a full physical and a pelvic exam to screen for  STDs. Overdue for colonoscopy, [facility] did not take her insurance so she is going to [facility], this should be scheduled in the coming weeks/months.  OBJECTIVE:   BP 110/75   Pulse 75   Ht '5\' 5"'$  (1.651 m)   Wt 165 lb 6.4 oz (75 kg)   SpO2 99%   BMI 27.52 kg/m   Physical Exam Vitals reviewed.  Constitutional:      General: She is not in acute distress. HENT:     Mouth/Throat:     Mouth: Mucous membranes are moist.     Comments: TMJ without crepitus or clunking. Teeth without obvious stigmata of grinding.  Cardiovascular:     Rate and Rhythm: Normal rate and regular rhythm.     Heart sounds: No murmur heard.    No friction rub. No gallop.  Pulmonary:     Effort: Pulmonary effort is normal. No respiratory distress.     Breath sounds: No wheezing or rales.  Abdominal:     General: Abdomen is flat. There is no distension.     Palpations: Abdomen is soft.     Tenderness: There is no abdominal tenderness.  Musculoskeletal:     Cervical back: Neck supple. No rigidity.  Lymphadenopathy:     Cervical: No cervical adenopathy.  Skin:    General: Skin is warm and dry.  Psychiatric:        Mood and  Affect: Mood normal.      ASSESSMENT/PLAN:   Daytime sleepiness Intermediate STOP-BANG score.  I suspect that her daytime naps are interfering with her evening sleep.  She is more tired during the day than I would expect otherwise, have a reasonably high suspicion for sleep apnea especially given strong family history of the same.  Though she is attributing her symptoms to her recent depressive episode, and this could certainly be contributing, she and I agree that her depression is reasonably well-controlled all things considered. -Referral for split-night study -Continue Prozac 20 mg daily  Bruxism (teeth grinding) Discussed OTC vs dentist-made night guards, she elects to trial an over-the-counter option. Suspect the stress from her cousin's recent death is exacerbating  symptoms.   Dizziness Resolved. Suspect this was 2/2 high dose Zoloft and cannabis.   Healthcare maintenance After our discussion, she opts to delay RSV vaccination until at least age 50.  She will return next week for a physical exam and lab work.  We will perform a pelvic and STD screening at that visit.  She no longer has a cervix so Pap smear is not indicated.  She has a colonoscopy scheduled with the [facility].  We will give her a COVID booster at her visit next week.     Pearla Dubonnet, MD [facility]

## 2022-11-12 NOTE — Assessment & Plan Note (Signed)
Intermediate STOP-BANG score.  I suspect that her daytime naps are interfering with her evening sleep.  She is more tired during the day than I would expect otherwise, have a reasonably high suspicion for sleep apnea especially given strong family history of the same.  Though she is attributing her symptoms to her recent depressive episode, and this could certainly be contributing, she and I agree that her depression is reasonably well-controlled all things considered. -Referral for split-night study -Continue Prozac 20 mg daily

## 2022-11-12 NOTE — Assessment & Plan Note (Signed)
After our discussion, she opts to delay RSV vaccination until at least age 57.  She will return next week for a physical exam and lab work.  We will perform a pelvic and STD screening at that visit.  She no longer has a cervix so Pap smear is not indicated.  She has a colonoscopy scheduled with the goal GI.  We will give her a COVID booster at her visit next week.

## 2022-11-12 NOTE — Assessment & Plan Note (Signed)
Discussed OTC vs dentist-made night guards, she elects to trial an over-the-counter option. Suspect the stress from her cousin's recent death is exacerbating symptoms.

## 2022-11-12 NOTE — Assessment & Plan Note (Signed)
Resolved. Suspect this was 2/2 high dose Zoloft and cannabis.

## 2022-11-19 ENCOUNTER — Ambulatory Visit: Payer: Self-pay

## 2022-11-19 ENCOUNTER — Encounter: Payer: Self-pay | Admitting: Student

## 2022-11-20 ENCOUNTER — Encounter (HOSPITAL_COMMUNITY): Payer: Medicaid Other | Admitting: Psychiatry

## 2022-11-20 ENCOUNTER — Ambulatory Visit (HOSPITAL_COMMUNITY): Payer: BLUE CROSS/BLUE SHIELD | Admitting: Physician Assistant

## 2022-11-22 ENCOUNTER — Other Ambulatory Visit: Payer: Self-pay

## 2022-11-22 ENCOUNTER — Ambulatory Visit (INDEPENDENT_AMBULATORY_CARE_PROVIDER_SITE_OTHER): Payer: BLUE CROSS/BLUE SHIELD | Admitting: Student

## 2022-11-22 ENCOUNTER — Encounter: Payer: Self-pay | Admitting: Student

## 2022-11-22 VITALS — BP 100/70 | HR 70 | Temp 98.2°F | Wt 163.8 lb

## 2022-11-22 DIAGNOSIS — Z131 Encounter for screening for diabetes mellitus: Secondary | ICD-10-CM

## 2022-11-22 DIAGNOSIS — E782 Mixed hyperlipidemia: Secondary | ICD-10-CM | POA: Diagnosis not present

## 2022-11-22 DIAGNOSIS — L732 Hidradenitis suppurativa: Secondary | ICD-10-CM

## 2022-11-22 DIAGNOSIS — R253 Fasciculation: Secondary | ICD-10-CM

## 2022-11-22 DIAGNOSIS — Z23 Encounter for immunization: Secondary | ICD-10-CM

## 2022-11-22 DIAGNOSIS — Z0000 Encounter for general adult medical examination without abnormal findings: Secondary | ICD-10-CM

## 2022-11-22 LAB — POCT GLYCOSYLATED HEMOGLOBIN (HGB A1C): Hemoglobin A1C: 5.8 % — AB (ref 4.0–5.6)

## 2022-11-22 MED ORDER — SILVER SULFADIAZINE 1 % EX CREA: TOPICAL_CREAM | Freq: Every day | CUTANEOUS | Status: DC

## 2022-11-22 MED ORDER — SILVER SULFADIAZINE 1 % EX CREA
1.0000 | TOPICAL_CREAM | Freq: Every day | CUTANEOUS | 0 refills | Status: DC
  Filled 2022-11-22 – 2022-12-05 (×2): qty 50, 50d supply, fill #0

## 2022-11-22 MED ORDER — RSVPREF3 VAC RECOMB ADJUVANTED 120 MCG/0.5ML IM SUSR
0.5000 mL | Freq: Once | INTRAMUSCULAR | 0 refills | Status: AC
  Filled 2022-11-22: qty 1, 1d supply, fill #0

## 2022-11-22 NOTE — Progress Notes (Signed)
SUBJECTIVE:   Chief compliant/HPI: annual examination  Kristina Moreno is a 57 y.o. who presents today for an annual exam.  Also following up several issues: Bruxism Much improved with OTC mouth guard.  Marijuana Use Stopped smoking two days ago, switched to gummies for lung health reasons. Taking '350mg'$  THC per day.   Muscle Twitching On and Off for quite some time. Thinks her magnesium may be low, would like to check this today. Twitching most noticeable in her thighs and buttocks.    Depression Generally well-controlled. Missed her last psychiatry appointment. Prozac "keeping (self-harm/suicide) thoughts out of my head." Almost had a panic attack Wednesday night due to mom telling her she had emphysema. Denies SI. Does not feel the need to make any changes as of now. Contracts for safety.   History tabs reviewed and updated yes.   Review of systems form reviewed and notable for none, feels well .   OBJECTIVE:   BP 100/70   Pulse 70   Temp 98.2 F (36.8 C)   Wt 163 lb 12.8 oz (74.3 kg)   SpO2 100%   BMI 27.26 kg/m   Physical Exam Vitals reviewed.  Constitutional:      General: She is not in acute distress. HENT:     Mouth/Throat:     Mouth: Mucous membranes are moist.  Cardiovascular:     Rate and Rhythm: Normal rate.     Pulses: Normal pulses.     Heart sounds: Normal heart sounds. No murmur heard.    No friction rub. No gallop.  Pulmonary:     Effort: Pulmonary effort is normal. No respiratory distress.     Breath sounds: No wheezing or rales.  Abdominal:     General: There is no distension.     Palpations: Abdomen is soft.     Tenderness: There is no abdominal tenderness.  Musculoskeletal:        General: No swelling or deformity.     Cervical back: Normal range of motion.  Lymphadenopathy:     Cervical: No cervical adenopathy.  Skin:    Capillary Refill: Capillary refill takes less than 2 seconds.  Neurological:     General: No focal deficit  present.     ASSESSMENT/PLAN:   No problem-specific Assessment & Plan notes found for this encounter.    Annual Examination  See AVS for age appropriate recommendations  PHQ score 9, reviewed and discussed.  BP reviewed and at goal yes.   Considered the following items based upon USPSTF recommendations: Diabetes screening:  Known prediabetic, A1c is 5.8% today, relatively stable from previous Screening for elevated cholesterol: ordered HIV testing:  Patient defers STD screening and pelvic exam to her OB/GYN Hepatitis C: Patient defers STD screening and pelvic exam to her OB/GYN Hepatitis B: Patient defers STD screening and pelvic exam to her OB/GYN Syphilis if at high risk: Patient defers STD screening and pelvic exam to her OB/GYN GC/CT Patient defers STD screening and pelvic exam to her OB/GYN Reviewed risk factors for latent tuberculosis and not indicated   Cervical cancer screening:  not indicated--patient no longer has cervix Breast cancer screening:  UTD Colorectal cancer screening:  Ordered at previous visit, patient is working on getting this scheduled.  Had to change GI providers and so is having to have records faxed from her old clinic to the new one. Hope to have this done in the next few  weeks/months. Lung cancer screening:  not indicated. .  Vaccinations  Desires RSV vaccination--had a long discussion about risk and benefits at our last visit and I left the ball in her record.  Today she does request a prescription.  She understands that this may not be covered by insurance given that she is not yet 57 years old..  Also requests COVID booster--unfortunately clinic is out of stock. Will have this done at her pharmacy.   Follow up in 1  year or sooner if indicated.    Pearla Dubonnet, MD  [place]

## 2022-11-22 NOTE — Patient Instructions (Addendum)
Kristina Moreno,  Always great to see you!  I am going to check your electrolytes today to see if we can get to the bottom of this muscle twitching.  I am also going to check your cholesterol.  I do suspect that we are to the point where you are going to need to be on a small dose of cholesterol medicine.  I will call you once your results are and to discuss next steps here.  Please let me know once you have had your colonoscopy done.  I am sending a paper prescription for your RSV vaccine.  You can take this to your pharmacy to get this done and to have your COVID booster as we do not have them in stock today.  Pearla Dubonnet, MD

## 2022-11-23 ENCOUNTER — Other Ambulatory Visit: Payer: Self-pay

## 2022-11-23 LAB — COMPREHENSIVE METABOLIC PANEL
ALT: 11 IU/L (ref 0–32)
AST: 15 IU/L (ref 0–40)
Albumin/Globulin Ratio: 1.8 (ref 1.2–2.2)
Albumin: 4.2 g/dL (ref 3.8–4.9)
Alkaline Phosphatase: 91 IU/L (ref 44–121)
BUN/Creatinine Ratio: 18 (ref 9–23)
BUN: 16 mg/dL (ref 6–24)
Bilirubin Total: 0.4 mg/dL (ref 0.0–1.2)
CO2: 23 mmol/L (ref 20–29)
Calcium: 9.5 mg/dL (ref 8.7–10.2)
Chloride: 103 mmol/L (ref 96–106)
Creatinine, Ser: 0.88 mg/dL (ref 0.57–1.00)
Globulin, Total: 2.3 g/dL (ref 1.5–4.5)
Glucose: 93 mg/dL (ref 70–99)
Potassium: 4.1 mmol/L (ref 3.5–5.2)
Sodium: 139 mmol/L (ref 134–144)
Total Protein: 6.5 g/dL (ref 6.0–8.5)
eGFR: 76 mL/min/{1.73_m2} (ref >59)

## 2022-11-23 LAB — LDL CHOLESTEROL, DIRECT: LDL Direct: 154 mg/dL — ABNORMAL HIGH (ref 0–99)

## 2022-11-23 LAB — MAGNESIUM: Magnesium: 1.9 mg/dL (ref 1.6–2.3)

## 2022-11-23 MED ORDER — ROSUVASTATIN CALCIUM 20 MG PO TABS
20.0000 mg | ORAL_TABLET | Freq: Every day | ORAL | 3 refills | Status: DC
Start: 2022-11-23 — End: 2023-09-06
  Filled 2022-11-23 – 2022-12-05 (×2): qty 90, 90d supply, fill #0
  Filled 2023-03-07: qty 90, 90d supply, fill #1
  Filled 2023-06-28: qty 30, 30d supply, fill #2
  Filled 2023-08-05: qty 30, 30d supply, fill #3

## 2022-11-23 NOTE — Addendum Note (Signed)
Addended by: Jim Like B on: 11/23/2022 10:09 AM   Modules accepted: Orders

## 2022-11-28 ENCOUNTER — Telehealth: Payer: Self-pay | Admitting: *Deleted

## 2022-11-28 NOTE — Telephone Encounter (Signed)
Received a call from [place] with [facility] and she states that patient's insurance is needing a peer to peer for her sleep study.  Please call [phone] and provide subscriber ID # [id number] .  This will need to be performed in the next 24 hours per insurance.   Please let me know the outcome so I can update Terri [phone]).    Thanks [employer]

## 2022-11-29 ENCOUNTER — Other Ambulatory Visit: Payer: Self-pay

## 2022-12-05 ENCOUNTER — Other Ambulatory Visit: Payer: Self-pay

## 2022-12-10 ENCOUNTER — Other Ambulatory Visit (HOSPITAL_COMMUNITY): Payer: Self-pay | Admitting: Student

## 2022-12-10 ENCOUNTER — Other Ambulatory Visit: Payer: Self-pay

## 2022-12-10 ENCOUNTER — Telehealth (HOSPITAL_COMMUNITY): Payer: Self-pay | Admitting: Physician Assistant

## 2022-12-10 MED ORDER — FLUOXETINE HCL 20 MG PO CAPS
20.0000 mg | ORAL_CAPSULE | Freq: Every day | ORAL | 1 refills | Status: DC
  Filled 2022-12-10: qty 30, 30d supply, fill #0

## 2022-12-11 ENCOUNTER — Other Ambulatory Visit: Payer: Self-pay

## 2022-12-11 ENCOUNTER — Other Ambulatory Visit (HOSPITAL_COMMUNITY): Payer: Self-pay | Admitting: Physician Assistant

## 2022-12-11 DIAGNOSIS — F419 Anxiety disorder, unspecified: Secondary | ICD-10-CM

## 2022-12-11 DIAGNOSIS — F331 Major depressive disorder, recurrent, moderate: Secondary | ICD-10-CM

## 2022-12-11 DIAGNOSIS — F4310 Post-traumatic stress disorder, unspecified: Secondary | ICD-10-CM

## 2022-12-11 MED ORDER — FLUOXETINE HCL 20 MG PO CAPS
20.0000 mg | ORAL_CAPSULE | Freq: Every day | ORAL | 0 refills | Status: DC
  Filled 2022-12-11: qty 30, 30d supply, fill #0

## 2022-12-11 NOTE — Progress Notes (Signed)
Provider was contacted by Saintclair Halsted regarding patient's request for medication refill.  Patient only has 3 pills left and is requesting a bridge of her medication until the next encounter.  Provider to prescribe patient a bridge of her medication until her next encounter.  Patient's medication to be e-prescribed to pharmacy of choice.

## 2022-12-11 NOTE — Telephone Encounter (Signed)
Provider was contacted by Saintclair Halsted regarding patient's request for medication refill.  Patient only has 3 pills left and is requesting a bridge of her medication until the next encounter.  Provider to prescribe patient a bridge of her medication until her next encounter.  Patient's medication to be e-prescribed to pharmacy of choice.

## 2022-12-12 ENCOUNTER — Other Ambulatory Visit: Payer: Self-pay

## 2023-01-10 ENCOUNTER — Ambulatory Visit (INDEPENDENT_AMBULATORY_CARE_PROVIDER_SITE_OTHER): Payer: BLUE CROSS/BLUE SHIELD | Admitting: Family Medicine

## 2023-01-10 ENCOUNTER — Encounter (HOSPITAL_COMMUNITY): Payer: Self-pay | Admitting: Physician Assistant

## 2023-01-10 ENCOUNTER — Ambulatory Visit (HOSPITAL_COMMUNITY)
Admission: EM | Admit: 2023-01-10 | Discharge: 2023-01-10 | Discharge status: Against Medical Advice | Payer: BLUE CROSS/BLUE SHIELD | Attending: Family Medicine | Admitting: Family Medicine

## 2023-01-10 ENCOUNTER — Ambulatory Visit (INDEPENDENT_AMBULATORY_CARE_PROVIDER_SITE_OTHER): Payer: BLUE CROSS/BLUE SHIELD | Admitting: Physician Assistant

## 2023-01-10 ENCOUNTER — Other Ambulatory Visit: Payer: Self-pay

## 2023-01-10 VITALS — BP 114/72 | HR 79 | Ht 65.0 in | Wt 164.0 lb

## 2023-01-10 DIAGNOSIS — F331 Major depressive disorder, recurrent, moderate: Secondary | ICD-10-CM | POA: Diagnosis not present

## 2023-01-10 DIAGNOSIS — F419 Anxiety disorder, unspecified: Secondary | ICD-10-CM

## 2023-01-10 DIAGNOSIS — M25532 Pain in left wrist: Secondary | ICD-10-CM | POA: Diagnosis not present

## 2023-01-10 DIAGNOSIS — F4310 Post-traumatic stress disorder, unspecified: Secondary | ICD-10-CM

## 2023-01-10 DIAGNOSIS — M25432 Effusion, left wrist: Secondary | ICD-10-CM

## 2023-01-10 MED ORDER — FLUOXETINE HCL 40 MG PO CAPS
40.0000 mg | ORAL_CAPSULE | Freq: Every day | ORAL | 1 refills | Status: DC
  Filled 2023-01-10: qty 30, 30d supply, fill #0

## 2023-01-10 MED ORDER — COLCHICINE 0.6 MG PO TABS
ORAL_TABLET | ORAL | 0 refills | Status: DC
  Filled 2023-01-10: qty 15, 5d supply, fill #0

## 2023-01-10 MED ORDER — FLUOXETINE HCL 40 MG PO CAPS
40.0000 mg | ORAL_CAPSULE | Freq: Every day | ORAL | 2 refills | Status: DC
Start: 2023-01-10 — End: 2023-02-07
  Filled 2023-01-10: qty 30, 30d supply, fill #0

## 2023-01-10 NOTE — Progress Notes (Signed)
    SUBJECTIVE:   CHIEF COMPLAINT / HPI:   L Wrist Pain/Swelling Symptoms started acutely 3-4 days ago.  She reports significant pain in her left wrist as well as swelling and redness to the area.  No trauma or injury.  No other joint pain.  No fever, chills, fatigue, or other systemic issues.  No history of similar issues in the past.  Patient concerned as her sister and mom both have rheumatoid arthritis.  PERTINENT  PMH / PSH: Prediabetes, HLD, PTSD, anxiety, depression  OBJECTIVE:   BP 114/72   Pulse 79   Ht 5\' 5"  (1.651 m)   Wt 164 lb (74.4 kg)   SpO2 99%   BMI 27.29 kg/m   General: NAD, pleasant, able to participate in exam Respiratory: No respiratory distress Skin: warm and dry, no rashes noted Psych: Normal affect and mood Neuro: grossly intact MSK: Left wrist: mild swelling over dorsal aspect of wrist, slight erythema and warmth of left wrist. Significant tenderness to palpation throughout dorsal wrist, severely limited ROM secondary to pain. No involvement of MCP, DIP, or PIP joints. No involvement of proximal forearm or elbow.  ASSESSMENT/PLAN:   Pain and swelling of left wrist Acute, nontraumatic L wrist pain. Exam reveals mild swelling with erythema, tenderness, and decreased ROM. Presentation consistent with likely gout.  Rx sent for colchicine. Consider checking uric acid level in the future. Lower suspicion for RA, but will check rheumatoid factor given strong family history.    Alcus Dad, MD [place]

## 2023-01-10 NOTE — Patient Instructions (Addendum)
It was great to see you!  Your symptoms are most likely related to gout. I have sent a prescription medication to your pharmacy to treat this. Do not take NSAIDs while taking this medication (NSAIDs included ibuprofen, Motrin, Advil, Aleve). Tylenol (acetaminophen) is fine to take.  We are checking some blood work today. I will send you a letter with the results or call if abnormal.  Take care, Dr Rock Nephew
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
## 2023-01-10 NOTE — Assessment & Plan Note (Addendum)
Acute, nontraumatic L wrist pain. Exam reveals mild swelling with erythema, tenderness, and decreased ROM. Presentation consistent with likely gout.  Rx sent for colchicine. Consider checking uric acid level in the future. Lower suspicion for RA, but will check rheumatoid factor given strong family history.

## 2023-01-11 ENCOUNTER — Encounter: Payer: Self-pay | Admitting: Family Medicine

## 2023-01-11 LAB — RHEUMATOID FACTOR: Rheumatoid fact SerPl-aCnc: 11.8 IU/mL (ref <14.0)

## 2023-01-14 ENCOUNTER — Other Ambulatory Visit: Payer: Self-pay
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
## 2023-02-07 ENCOUNTER — Encounter (HOSPITAL_COMMUNITY): Payer: Self-pay | Admitting: Psychiatry

## 2023-02-07 ENCOUNTER — Other Ambulatory Visit: Payer: Self-pay

## 2023-02-07 ENCOUNTER — Ambulatory Visit (INDEPENDENT_AMBULATORY_CARE_PROVIDER_SITE_OTHER): Payer: BLUE CROSS/BLUE SHIELD | Admitting: Psychiatry

## 2023-02-07 VITALS — BP 139/91 | HR 77 | Ht 65.0 in | Wt 161.0 lb

## 2023-02-07 DIAGNOSIS — F419 Anxiety disorder, unspecified: Secondary | ICD-10-CM

## 2023-02-07 DIAGNOSIS — F331 Major depressive disorder, recurrent, moderate: Secondary | ICD-10-CM | POA: Diagnosis not present

## 2023-02-07 DIAGNOSIS — F4310 Post-traumatic stress disorder, unspecified: Secondary | ICD-10-CM

## 2023-02-07 MED ORDER — FLUOXETINE HCL 20 MG PO CAPS
20.0000 mg | ORAL_CAPSULE | Freq: Every day | ORAL | 2 refills | Status: DC
  Filled 2023-02-07: qty 30, 30d supply, fill #0
  Filled 2023-03-07: qty 30, 30d supply, fill #1

## 2023-02-07 MED ORDER — HYDROXYZINE HCL 25 MG PO TABS
25.0000 mg | ORAL_TABLET | Freq: Two times a day (BID) | ORAL | 1 refills | Status: DC | PRN
  Filled 2023-02-07: qty 60, 30d supply, fill #0

## 2023-02-07 NOTE — Patient Instructions (Addendum)
Thank you for attending your appointment today.  -- DECREASE Prozac to 20 mg daily -- Continue Atarax 25 mg twice daily as needed for anxiety -- Continue other medications as prescribed.  Please do not make any changes to medications without first discussing with your provider. If you are experiencing a psychiatric emergency, please call 911 or present to your nearest emergency department. Additional crisis, medication management, and therapy resources are included below.  [facility]  [address] [place], [place] [address] [phone] WALK-IN URGENT CARE 24/7 FOR ANYONE [address], [place], [place]  [phone] Fax: [phone] [email or web address] *Interpreters available *Accepts all insurance and uninsured for Urgent Care needs *Accepts Medicaid and uninsured for outpatient treatment (below)      ONLY FOR [facility]  Below:    Outpatient New Patient Assessment/Therapy Walk-ins:        Monday -Thursday 8am until slots are full.        Every Friday 1pm-4pm  (first come, first served)                   New Patient Psychiatry/Medication Management        Monday-Friday 8am-11am (first come, first served)               For all walk-ins we ask that you arrive by 7:15am, because patients will be seen in the order of arrival.

## 2023-03-07 ENCOUNTER — Other Ambulatory Visit: Payer: Self-pay

## 2023-03-14 ENCOUNTER — Encounter (HOSPITAL_COMMUNITY): Payer: BLUE CROSS/BLUE SHIELD | Admitting: Psychiatry
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
## 2023-03-28 ENCOUNTER — Other Ambulatory Visit: Payer: Self-pay

## 2023-03-28 ENCOUNTER — Ambulatory Visit (INDEPENDENT_AMBULATORY_CARE_PROVIDER_SITE_OTHER): Payer: BLUE CROSS/BLUE SHIELD | Admitting: Psychiatry

## 2023-03-28 ENCOUNTER — Encounter (HOSPITAL_COMMUNITY): Payer: Self-pay | Admitting: Psychiatry

## 2023-03-28 VITALS — BP 106/73 | HR 72 | Wt 168.6 lb

## 2023-03-28 DIAGNOSIS — F339 Major depressive disorder, recurrent, unspecified: Secondary | ICD-10-CM

## 2023-03-28 DIAGNOSIS — F419 Anxiety disorder, unspecified: Secondary | ICD-10-CM

## 2023-03-28 DIAGNOSIS — F4310 Post-traumatic stress disorder, unspecified: Secondary | ICD-10-CM

## 2023-03-28 DIAGNOSIS — F1290 Cannabis use, unspecified, uncomplicated: Secondary | ICD-10-CM

## 2023-03-28 DIAGNOSIS — F1491 Cocaine use, unspecified, in remission: Secondary | ICD-10-CM

## 2023-03-28 MED ORDER — FLUOXETINE HCL 20 MG PO CAPS
20.0000 mg | ORAL_CAPSULE | Freq: Every day | ORAL | 2 refills | Status: DC
  Filled 2023-03-28 – 2023-04-18 (×3): qty 30, 30d supply, fill #0
  Filled 2023-05-24: qty 30, 30d supply, fill #1

## 2023-03-28 MED ORDER — HYDROXYZINE HCL 25 MG PO TABS
50.0000 mg | ORAL_TABLET | Freq: Every evening | ORAL | 2 refills | Status: DC | PRN
  Filled 2023-03-28: qty 60, 30d supply, fill #0

## 2023-03-28 NOTE — Patient Instructions (Signed)
Thank you for attending your appointment today.  -- We did not make any medication changes today. Please continue medications as prescribed.  Please do not make any changes to medications without first discussing with your provider. If you are experiencing a psychiatric emergency, please call 911 or present to your nearest emergency department. Additional crisis, medication management, and therapy resources are included below.  [facility]  [address], [place], [place] [address] [phone] WALK-IN URGENT CARE 24/7 FOR ANYONE [address], [place], [place]  [phone] Fax: [phone] [email or web address] *Interpreters available *Accepts all insurance and uninsured for Urgent Care needs *Accepts Medicaid and uninsured for outpatient treatment (below)      ONLY FOR [place] Residents  Below:    Outpatient New Patient Assessment/Therapy Walk-ins:        Monday -Thursday 8am until slots are full.        Every Friday 1pm-4pm  (first come, first served)                   New Patient Psychiatry/Medication Management        Monday-Friday 8am-11am (first come, first served)               For all walk-ins we ask that you arrive by 7:15am, because patients will be seen in the order of arrival.   

## 2023-04-01 ENCOUNTER — Other Ambulatory Visit: Payer: Self-pay

## 2023-04-04 ENCOUNTER — Other Ambulatory Visit: Payer: Self-pay

## 2023-04-04 ENCOUNTER — Ambulatory Visit (HOSPITAL_COMMUNITY): Payer: BLUE CROSS/BLUE SHIELD | Admitting: Mental Health

## 2023-04-08 ENCOUNTER — Other Ambulatory Visit: Payer: Self-pay

## 2023-04-18 ENCOUNTER — Other Ambulatory Visit: Payer: Self-pay

## 2023-05-07 ENCOUNTER — Encounter: Payer: Self-pay | Admitting: Family Medicine

## 2023-05-07 ENCOUNTER — Other Ambulatory Visit: Payer: Self-pay

## 2023-05-07 ENCOUNTER — Ambulatory Visit (INDEPENDENT_AMBULATORY_CARE_PROVIDER_SITE_OTHER): Payer: BLUE CROSS/BLUE SHIELD | Admitting: Family Medicine

## 2023-05-07 VITALS — BP 104/64 | HR 82 | Ht 65.0 in | Wt 172.0 lb

## 2023-05-07 DIAGNOSIS — K649 Unspecified hemorrhoids: Secondary | ICD-10-CM

## 2023-05-07 MED ORDER — SITZ BATH MISC
1.0000 | Freq: Three times a day (TID) | 2 refills | Status: DC | PRN
Start: 2023-05-07 — End: 2023-08-30
  Filled 2023-05-07: qty 2, fill #0

## 2023-05-07 NOTE — Assessment & Plan Note (Signed)
Patient was unable to follow through with prior referral due to insurance issue.  We have sent referral through [facility] for proper sorting.  Advised patient that we would give her a call when there was an appointment available with the provider that took her insurance.  Discussed with patient symptom relief strategies including continuing to use over-the-counter hemorrhoid creams and using a sitz bath up to 3 times a day to help with feeling of bulging and discomfort.  Patient already supplements with fiber and a stool softener.  Advised adequate water intake.  Patient can follow-up as needed with her primary care doctor.

## 2023-05-07 NOTE — Progress Notes (Signed)
    SUBJECTIVE:   CHIEF COMPLAINT / HPI:   Has had hemorrhoids on and off for years, says that about 5 months ago she noticed them bulging out of her anus. She has tried OTC hemorrhoid creams with no relief. She states that a few days ago she saw bright red blood in her stool, on the toilet paper and in the toilet bowl.   Is not able to go to [facility] as they do not accept her insurance. Will need re-referral to different provider for colonoscopy.  Record request needed for previous colonoscopies from [facility].    PERTINENT  PMH / PSH: Grade II anorectal hemorrhoids s/p banding 2018  OBJECTIVE:   BP 104/64   Pulse 82   Ht 5\' 5"  (1.651 m)   Wt 172 lb (78 kg)   SpO2 100%   BMI 28.62 kg/m   General: A&O, NAD Cardiac: RRR, no m/r/g Respiratory: CTAB, normal WOB, no w/c/r GI: Soft, NTTP, non-distended.  Multiple external hemorrhoids around anal introitus.  Soft and nontender.  No appearance of thrombosis.  No blood detected at this time.  Jon Gills was present as Biomedical engineer.  ASSESSMENT/PLAN:   Hemorrhoids Patient was unable to follow through with prior referral due to insurance issue.  We have sent referral through [facility] for proper sorting.  Advised patient that we would give her a call when there was an appointment available with the provider that took her insurance.  Discussed with patient symptom relief strategies including continuing to use over-the-counter hemorrhoid creams and using a sitz bath up to 3 times a day to help with feeling of bulging and discomfort.  Patient already supplements with fiber and a stool softener.  Advised adequate water intake.  Patient can follow-up as needed with her primary care doctor.     Gerrit Heck, DO Outpatient Services [facility]

## 2023-05-07 NOTE — Patient Instructions (Signed)
It was wonderful to see you today.  Today we talked about:  Your ongoing hemorrhoid issue.  We will refer you through our referral system so that we can make sure you get sent to an office that accepts your insurance.  Once you have been scheduled somewhere should be able to send a record request to get your previous colonoscopy records from [facility].  In the meantime continue to use over-the-counter relief as needed for any itching or pain of your hemorrhoids.  I have also ordered you a sitz bath for you to use up to 3 times a day as needed for relief.  You have any questions or concerns please feel free to call us, you can follow-up with your regular care provider as needed.  Thank you for choosing [facility].   Please call [phone] with any questions about today's appointment.  Please arrive at least 15 minutes prior to your scheduled appointments.   If you had blood work today, I will send you a MyChart message or a letter if results are normal. Otherwise, I will give you a call.   If you had a referral placed, they will call you to set up an appointment. Please give [place] a call if you don't hear back in the next 2 weeks.   If you need additional refills before your next appointment, please call your pharmacy first.   Gerrit Heck, DO Family Medicine

## 2023-05-24 ENCOUNTER — Other Ambulatory Visit: Payer: Self-pay

## 2023-05-28 ENCOUNTER — Encounter (HOSPITAL_COMMUNITY): Payer: BLUE CROSS/BLUE SHIELD | Admitting: Psychiatry
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
## 2023-06-26 ENCOUNTER — Telehealth (HOSPITAL_COMMUNITY): Payer: Self-pay | Admitting: Psychiatry

## 2023-06-27 ENCOUNTER — Other Ambulatory Visit: Payer: Self-pay

## 2023-06-27 MED ORDER — HYDROXYZINE HCL 25 MG PO TABS
50.0000 mg | ORAL_TABLET | Freq: Every evening | ORAL | 1 refills | Status: DC | PRN
  Filled 2023-06-27: qty 60, 30d supply, fill #0

## 2023-06-27 MED ORDER — FLUOXETINE HCL 20 MG PO CAPS
20.0000 mg | ORAL_CAPSULE | Freq: Every day | ORAL | 1 refills | Status: DC
  Filled 2023-06-27: qty 30, 30d supply, fill #0
  Filled 2023-08-05: qty 30, 30d supply, fill #1

## 2023-06-27 NOTE — Telephone Encounter (Signed)
Patient called requesting refill to bridge until next appointment with this writer on 08/27/23. Refills of Prozac 20 mg daily and Atarax 50 mg nightly PRN sent to preferred pharmacy.  Daine Gip, MD 06/27/23

## 2023-06-28 ENCOUNTER — Other Ambulatory Visit: Payer: Self-pay

## 2023-08-05 ENCOUNTER — Other Ambulatory Visit: Payer: Self-pay

## 2023-08-05 MED ORDER — AZITHROMYCIN 250 MG PO TABS
ORAL_TABLET | ORAL | 0 refills | Status: DC
  Filled 2023-08-05: qty 6, 5d supply, fill #0

## 2023-08-20 ENCOUNTER — Ambulatory Visit: Payer: BLUE CROSS/BLUE SHIELD

## 2023-08-20 DIAGNOSIS — Z23 Encounter for immunization: Secondary | ICD-10-CM

## 2023-08-20 NOTE — Progress Notes (Signed)
Patient presents to nurse clinic for flu vaccination. Administered in LD, site unremarkable, tolerated injection well.   Cordarrel Stiefel C Arek Spadafore, RN   
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
## 2023-08-27 ENCOUNTER — Encounter (HOSPITAL_COMMUNITY): Payer: BLUE CROSS/BLUE SHIELD | Admitting: Psychiatry

## 2023-08-29 ENCOUNTER — Ambulatory Visit (INDEPENDENT_AMBULATORY_CARE_PROVIDER_SITE_OTHER): Payer: BLUE CROSS/BLUE SHIELD | Admitting: Student

## 2023-08-29 ENCOUNTER — Other Ambulatory Visit (HOSPITAL_COMMUNITY): Payer: Self-pay | Admitting: Psychiatry

## 2023-08-29 ENCOUNTER — Ambulatory Visit (HOSPITAL_COMMUNITY)
Admission: RE | Admit: 2023-08-29 | Discharge: 2023-08-29 | Disposition: A | Discharge status: Home / Self Care | Payer: BLUE CROSS/BLUE SHIELD | Source: Ambulatory Visit | Attending: Family Medicine | Admitting: Family Medicine

## 2023-08-29 ENCOUNTER — Other Ambulatory Visit: Payer: Self-pay

## 2023-08-29 ENCOUNTER — Telehealth (HOSPITAL_COMMUNITY): Payer: Self-pay | Admitting: Psychiatry

## 2023-08-29 VITALS — BP 111/77 | HR 97 | Wt 168.8 lb

## 2023-08-29 DIAGNOSIS — R079 Chest pain, unspecified: Secondary | ICD-10-CM

## 2023-08-29 DIAGNOSIS — Z1211 Encounter for screening for malignant neoplasm of colon: Secondary | ICD-10-CM

## 2023-08-29 DIAGNOSIS — E782 Mixed hyperlipidemia: Secondary | ICD-10-CM

## 2023-08-29 DIAGNOSIS — Z1231 Encounter for screening mammogram for malignant neoplasm of breast: Secondary | ICD-10-CM

## 2023-08-29 MED ORDER — FLUOXETINE HCL 20 MG PO CAPS
20.0000 mg | ORAL_CAPSULE | Freq: Every day | ORAL | 1 refills | Status: DC
  Filled 2023-08-29: qty 30, 30d supply, fill #0

## 2023-08-29 MED ORDER — HYDROXYZINE HCL 25 MG PO TABS
50.0000 mg | ORAL_TABLET | Freq: Every evening | ORAL | 1 refills | Status: DC | PRN
  Filled 2023-08-29: qty 60, 30d supply, fill #0

## 2023-08-29 NOTE — Progress Notes (Signed)
SUBJECTIVE:   CHIEF COMPLAINT / HPI:   Chest Pain [name] is a 57 year old with PMHx of prediabetes and HLD who presents with dull, left-sided and sternal chest pain. She states that this chest pain occurred intermittently during October 2024, during which she had chest pain that resolved over a day. These episodes varied in duration, from a few minutes to two weeks. Her pain was exacerbated by her occupation; she works in produce and lifts heavy items often. She states that she has not tried any analgesics, however, discontinuing her vaping improved her chest pain a bit. She denies any SOB, diaphoresis, nausea or vomiting during these previous episodes of chest pain. She has had remote palpitations in the past, but not recently.  Currently, she denies chest pain, SOB, diaphoresis, nausea, vomiting, leg swelling.   Last month, she went to urgent care on [address] for an episode of numbness in her arm with chest pain. This resolved without any intervention, and she thinks that this chest pain was related to her anxiety. She has been feeling worried about her children as an "empty nester."  She notes that she has had anxiety and panic attacks in the past where her feet would tingle. She hasn't had panic attacks since starting her Prozac 2-3 years ago.   Health Maintenance Overdue for colonoscopy since 2022.  Had a scope in 2017 with a tubular adenoma removed without evidence of high-grade dysplasia or malignancy.  PERTINENT  PMH / PSH: PTSD, MDD, history of substance abuse, HLD, prediabetes  Denies Fhx of heart disease.   OBJECTIVE:   BP 111/77   Pulse 97   Wt 168 lb 12.8 oz (76.6 kg)   SpO2 100%   BMI 28.09 kg/m   Gen: Well-appearing and NAD, a bit anxious but generally in good spirits HENT: Neck supple and without LAD Chest: There is some tenderness to palpation of the upper chest Cardio: RRR, without murmur Pulm: Normal WOB on RA, lungs are clear throughout  EKG:  normal sinus rhythm, No ST changes or TWI to suggest ischemia.   ASSESSMENT/PLAN:   Kristina Moreno is a 57 year old with PMHx of prediabetes and HLD who presents with dull, left-sided and sternal chest pain.   Atypical Chest Pain Anxiety She is well-appearing on exam, and currently does not have any chest pain. The differential diagnosis for chest pain is extensive and includes: ACS, costochondritis, and anxiety. With an unremarkable EKG, atypical pain patters, and INTERCHEST score of 0, ischemic cardiac disease is less likely. Based on her mildly reproducible chest pain on physical exam as well as a history of anxiety disorder with known stressor, I think the etiology of her chest pain is a combination of musculoskeletal strain related to heavy lifting at work exacerbated by her anxiety. Discussed potentially increasing her Prozac dose, but she declines as she has tried higher doses in the past and did not like how they made her feel.  - Recommend topical Voltaren - Has ongoing psychiatry follow-up for anxiety - Do not feel strongly about pursuing an ischemic heart disease workup at this time, but would consider if symptoms persist or a more clear anginal pattern emerges.  - She does have some mild hyperlipidemia, was started on a statin (Crestor 20) in February of this year. Repeat lipid profile today.   Health Maintenance She is amenable to getting her colonoscopy done. It sounds like [facility] (where she had her last colonoscopy) no longer  takes her insurance. She will need to request records from them to be shared with ?[facility] where she is scheduled to have her next scope. She does not recall the name of the practice but tells me it was off of [address].   Syliva Overman (MS3)    I have evaluated this patient along with Medical Student J Hunt and reviewed the above note, making necessary revisions.  Dorothyann Gibbs, MD 08/30/2023, 2:51 PM PGY-3, [facility]

## 2023-08-29 NOTE — Patient Instructions (Addendum)
Kristina Moreno,   Your EKG is beautiful today.  I do not think that this chest pain that you are having is related to your heart.  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  You might want to try some topical Voltaren gel, you can buy this over-the-counter. You seem a bit anxious today--give it two weeks or so and then call me or MyChart message me and I'll see if there's anything I can do to help with this.  I have ordered your mammogram. This will be done at the [facility], right across the street from our clinic. You will need to call to make this appointment. Their number is [phone].  The address is [address], [place], [place] [address]  We are going to re-check your cholesterol today.   You will need to call [facility] to get the records from your last colonoscopy to pass along to the new docs.  Phone: [phone]  J Dorothyann Gibbs, MD

## 2023-08-29 NOTE — Progress Notes (Signed)
    SUBJECTIVE:   CHIEF COMPLAINT / HPI:   Chest Pain   Overdue for colonoscopy since 2022.  Had a scope in 2017 with a tubular adenoma removed without evidence of high-grade dysplasia or malignancy.  PERTINENT  PMH / PSH: PTSD, MDD, history of substance abuse, HLD, prediabetes  OBJECTIVE:   There were no vitals taken for this visit.  ***  ASSESSMENT/PLAN:   No problem-specific Assessment & Plan notes found for this encounter.     Eliezer Mccoy, MD [facility]

## 2023-08-30 LAB — LIPID PANEL
Chol/HDL Ratio: 3 ratio (ref 0.0–4.4)
Cholesterol, Total: 193 mg/dL (ref 100–199)
HDL: 65 mg/dL (ref >39)
LDL Chol Calc (NIH): 104 mg/dL — ABNORMAL HIGH (ref 0–99)
Triglycerides: 138 mg/dL (ref 0–149)
VLDL Cholesterol Cal: 24 mg/dL (ref 5–40)

## 2023-09-05 ENCOUNTER — Other Ambulatory Visit: Payer: Self-pay

## 2023-09-05 MED ORDER — ZOSTER VAC RECOMB ADJUVANTED 50 MCG/0.5ML IM SUSR
0.5000 mL | Freq: Once | INTRAMUSCULAR | 0 refills | Status: AC
  Filled 2023-09-05: qty 0.5, 1d supply, fill #0

## 2023-09-06 ENCOUNTER — Other Ambulatory Visit: Payer: Self-pay

## 2023-09-06 MED ORDER — ROSUVASTATIN CALCIUM 40 MG PO TABS
40.0000 mg | ORAL_TABLET | Freq: Every day | ORAL | 3 refills | Status: DC
  Filled 2023-09-06 – 2023-10-28 (×2): qty 90, 90d supply, fill #0
  Filled 2024-02-24: qty 90, 90d supply, fill #1
  Filled 2024-04-02: qty 30, 30d supply, fill #2
  Filled 2024-06-26: qty 90, 90d supply, fill #2

## 2023-09-06 NOTE — Addendum Note (Signed)
Addended by: Darnelle Spangle B on: 09/06/2023 12:30 PM   Modules accepted: Orders

## 2023-09-16 ENCOUNTER — Encounter (HOSPITAL_COMMUNITY): Payer: Self-pay | Admitting: Family

## 2023-09-16 ENCOUNTER — Ambulatory Visit (HOSPITAL_BASED_OUTPATIENT_CLINIC_OR_DEPARTMENT_OTHER): Payer: BLUE CROSS/BLUE SHIELD | Admitting: Family

## 2023-09-16 ENCOUNTER — Other Ambulatory Visit: Payer: Self-pay

## 2023-09-16 VITALS — BP 129/69 | HR 72 | Ht 65.0 in | Wt 172.0 lb

## 2023-09-16 DIAGNOSIS — F4310 Post-traumatic stress disorder, unspecified: Secondary | ICD-10-CM

## 2023-09-16 DIAGNOSIS — F339 Major depressive disorder, recurrent, unspecified: Secondary | ICD-10-CM | POA: Diagnosis not present

## 2023-09-16 MED ORDER — FLUOXETINE HCL 20 MG PO CAPS
20.0000 mg | ORAL_CAPSULE | Freq: Every day | ORAL | 1 refills | Status: DC
  Filled 2023-09-16 – 2023-10-28 (×2): qty 60, 60d supply, fill #0
  Filled 2023-12-19: qty 60, 60d supply, fill #1

## 2023-09-16 MED ORDER — HYDROXYZINE HCL 25 MG PO TABS
50.0000 mg | ORAL_TABLET | Freq: Every evening | ORAL | 1 refills | Status: DC | PRN
  Filled 2023-09-16 – 2023-12-19 (×2): qty 60, 30d supply, fill #0
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
## 2023-09-18 ENCOUNTER — Ambulatory Visit (INDEPENDENT_AMBULATORY_CARE_PROVIDER_SITE_OTHER): Payer: BLUE CROSS/BLUE SHIELD | Admitting: Student

## 2023-09-18 ENCOUNTER — Encounter: Payer: Self-pay | Admitting: Student

## 2023-09-18 VITALS — BP 161/84 | HR 63 | Ht 65.0 in | Wt 172.0 lb

## 2023-09-18 DIAGNOSIS — R079 Chest pain, unspecified: Secondary | ICD-10-CM

## 2023-09-20 ENCOUNTER — Emergency Department (HOSPITAL_COMMUNITY): Payer: BLUE CROSS/BLUE SHIELD

## 2023-09-20 ENCOUNTER — Emergency Department (HOSPITAL_COMMUNITY)
Admission: EM | Admit: 2023-09-20 | Discharge: 2023-09-20 | Disposition: A | Discharge status: Home / Self Care | Payer: BLUE CROSS/BLUE SHIELD | Attending: Emergency Medicine | Admitting: Emergency Medicine

## 2023-09-20 ENCOUNTER — Other Ambulatory Visit: Payer: Self-pay

## 2023-09-20 ENCOUNTER — Encounter (HOSPITAL_COMMUNITY): Payer: Self-pay

## 2023-09-20 DIAGNOSIS — R0789 Other chest pain: Secondary | ICD-10-CM | POA: Diagnosis present

## 2023-09-20 LAB — BASIC METABOLIC PANEL
Anion gap: 8 (ref 5–15)
BUN: 21 mg/dL — ABNORMAL HIGH (ref 6–20)
CO2: 24 mmol/L (ref 22–32)
Calcium: 9.3 mg/dL (ref 8.9–10.3)
Chloride: 108 mmol/L (ref 98–111)
Creatinine, Ser: 0.79 mg/dL (ref 0.44–1.00)
GFR, Estimated: >60 mL/min (ref >60)
Glucose, Bld: 110 mg/dL — ABNORMAL HIGH (ref 70–99)
Potassium: 3.8 mmol/L (ref 3.5–5.1)
Sodium: 140 mmol/L (ref 135–145)

## 2023-09-20 LAB — CBC
HCT: 37 % (ref 36.0–46.0)
Hemoglobin: 11.9 g/dL — ABNORMAL LOW (ref 12.0–15.0)
MCH: 26.6 pg (ref 26.0–34.0)
MCHC: 32.2 g/dL (ref 30.0–36.0)
MCV: 82.8 fL (ref 80.0–100.0)
Platelets: 184 10*3/uL (ref 150–400)
RBC: 4.47 MIL/uL (ref 3.87–5.11)
RDW: 15 % (ref 11.5–15.5)
WBC: 4.6 10*3/uL (ref 4.0–10.5)
nRBC: 0 % (ref 0.0–0.2)

## 2023-09-20 LAB — TROPONIN I (HIGH SENSITIVITY): Troponin I (High Sensitivity): 2 ng/L (ref <18)

## 2023-09-20 MED ORDER — IBUPROFEN 200 MG PO TABS
600.0000 mg | ORAL_TABLET | Freq: Once | ORAL | Status: AC
  Administered 2023-09-20: 600 mg via ORAL
  Filled 2023-09-20: qty 3

## 2023-09-20 NOTE — ED Provider Notes (Signed)
EMERGENCY DEPARTMENT AT [facility] Provider Note   CSN: [id number] Arrival date & time: 09/20/23  4098     History  Chief Complaint  Patient presents with   Chest Pain    Kristina Kirtland Bouchard Founds is a 57 y.o. female.  HPI 57 year old female presents with chest pain.  She states that she has been having chest pain and chest problems since she stopped vaping a month or so ago.  She had chest pain for about a whole month and then it went away.  However almost immediately after she drank a little bit of wine 4 days ago she immediately had recurrent chest pain that is stronger.  Feels like a pressure in her chest and is very sore whenever she touches her chest.  Certain movements will make it worse.  She does not feel like there is any trouble breathing.  No abdominal pain.  No leg swelling, recent travel, DVT/PE history.  She denies any cardiac disease. There's a little bit of pain in her back. She has not tried any meds or treatments for this. No exertional pain or pain with eating.  Home Medications Prior to Admission medications   Medication Sig Start Date End Date Taking? Authorizing Provider  FLUoxetine (PROZAC) 20 MG capsule Take 1 capsule (20 mg total) by mouth daily. 09/16/23   Oneta Rack, NP  hydrOXYzine (ATARAX) 25 MG tablet Take 2 tablets (50 mg total) by mouth at bedtime as needed for anxiety. 09/16/23   Oneta Rack, NP  rosuvastatin (CRESTOR) 40 MG tablet Take 1 tablet (40 mg total) by mouth daily. 09/06/23   Alicia Amel, MD      Allergies    Patient has no known allergies.    Review of Systems   Review of Systems  Constitutional:  Negative for fever.  Respiratory:  Negative for shortness of breath.   Cardiovascular:  Positive for chest pain. Negative for leg swelling.  Gastrointestinal:  Negative for abdominal pain.    Physical Exam Updated Vital Signs BP (!) 160/84 (BP Location: Left Arm)   Pulse 71   Temp 98.3 F (36.8 C) (Oral)    Resp 16   Ht 5\' 5"  (1.651 m)   Wt 78 kg   SpO2 100%   BMI 28.62 kg/m  Physical Exam Vitals and nursing note reviewed.  Constitutional:      Appearance: She is well-developed.  HENT:     Head: Normocephalic and atraumatic.  Cardiovascular:     Rate and Rhythm: Normal rate and regular rhythm.     Heart sounds: Normal heart sounds.  Pulmonary:     Effort: Pulmonary effort is normal.     Breath sounds: Normal breath sounds.  Chest:     Chest wall: Tenderness present.     Comments: When patient touches her chest there is a reproducible tenderness Abdominal:     Palpations: Abdomen is soft.     Tenderness: There is no abdominal tenderness.  Musculoskeletal:     Right lower leg: No tenderness. No edema.     Left lower leg: No tenderness. No edema.  Skin:    General: Skin is warm and dry.  Neurological:     Mental Status: She is alert.     ED Results / Procedures / Treatments   Labs (all labs ordered are listed, but only abnormal results are displayed) Labs Reviewed  BASIC METABOLIC PANEL - Abnormal; Notable for the following components:  Result Value   Glucose, Bld 110 (*)    BUN 21 (*)    All other components within normal limits  CBC - Abnormal; Notable for the following components:   Hemoglobin 11.9 (*)    All other components within normal limits  TROPONIN I (HIGH SENSITIVITY)    EKG EKG Interpretation Date/Time:  Friday September 20 2023 09:56:41 EST Ventricular Rate:  66 PR Interval:  131 QRS Duration:  84 QT Interval:  391 QTC Calculation: 410 R Axis:   71  Text Interpretation: Sinus rhythm no acute ST/T changes no significant change since Aug 29 2023 Confirmed by Pricilla Loveless [id number]) on 09/20/2023 10:20:40 AM  Radiology DG Chest 2 View  Result Date: 09/20/2023 CLINICAL DATA:  Chest pain for several days. EXAM: CHEST - 2 VIEW COMPARISON:  03/10/2019 FINDINGS: The heart size and mediastinal contours are within normal limits. Both lungs are clear.  The visualized skeletal structures are unremarkable. IMPRESSION: No active cardiopulmonary disease. Electronically Signed   By: Danae Orleans M.D.   On: 09/20/2023 10:27    Procedures Procedures    Medications Ordered in ED Medications  ibuprofen (ADVIL) tablet 600 mg (has no administration in time range)    ED Course/ Medical Decision Making/ A&P                                 Medical Decision Making Amount and/or Complexity of Data Reviewed Labs: ordered.    Details: Normal troponin Radiology: ordered and independent interpretation performed.    Details: No CHF or pneumothorax ECG/medicine tests: ordered and independent interpretation performed.    Details: No ischemia  Risk OTC drugs.   Patient presents with continuous chest pain for several days.  Troponin and EKG are unremarkable.  Given this I do not think a second is needed as I have a low suspicion for ACS and it has been continuous pain for days.  It is reproducible and probably is more chest wall in etiology.  Consider costochondritis and I have recommended she take some NSAIDs and Tylenol.  I have a low suspicion for PE given no significant risk factors, no hypoxia or tachycardia.  Low suspicion for dissection or esophageal emergency.  Will treat conservatively with NSAIDs and have her follow-up with PCP.  She is noted to have high blood pressure reading today and states that she does not carry diagnosis of hypertension and she will get this rechecked with her PCP.        Final Clinical Impression(s) / ED Diagnoses Final diagnoses:  Chest wall pain    Rx / DC Orders ED Discharge Orders     None         Pricilla Loveless, MD 09/20/23 1155

## 2023-09-20 NOTE — Discharge Instructions (Signed)
Use ibuprofen, you can also take some Tylenol to help with your pain.  Follow-up with your primary care physician.  Your blood pressure was elevated today, it is unclear if this is related to a diagnosis of hypertension versus elevated because of pain and other discomfort.  Get this rechecked with your family doctor.  Call them on Monday for an urgent ER follow-up appointment.  If you develop recurrent, continued, or worsening chest pain, shortness of breath, fever, vomiting, abdominal or back pain, or any other new/concerning symptoms then return to the ER for evaluation.

## 2023-09-20 NOTE — ED Notes (Signed)
PT EKG WAS DONE IN WTR2 THEY DIDN'T CLICK IT OFF

## 2023-09-20 NOTE — ED Triage Notes (Signed)
Pt presenting today complaining of chest pain that began Monday. Pt describes it a her chest is sore to the touch, and feels like it is on fire. Pt believes it could be related to her vaping about 1.5 months ago. Denies any N/V/D.

## 2023-09-23 NOTE — Progress Notes (Signed)
Patient eloped before being able to be seen.

## 2023-10-28 ENCOUNTER — Other Ambulatory Visit: Payer: Self-pay

## 2023-10-29 ENCOUNTER — Encounter (HOSPITAL_COMMUNITY): Payer: Self-pay

## 2023-10-29 ENCOUNTER — Encounter (HOSPITAL_COMMUNITY): Payer: BLUE CROSS/BLUE SHIELD | Admitting: Psychiatry

## 2023-12-19 ENCOUNTER — Other Ambulatory Visit: Payer: Self-pay

## 2023-12-23 ENCOUNTER — Other Ambulatory Visit: Payer: Self-pay

## 2023-12-23 ENCOUNTER — Ambulatory Visit (HOSPITAL_BASED_OUTPATIENT_CLINIC_OR_DEPARTMENT_OTHER): Payer: BLUE CROSS/BLUE SHIELD | Admitting: Family

## 2023-12-23 DIAGNOSIS — F419 Anxiety disorder, unspecified: Secondary | ICD-10-CM | POA: Diagnosis not present

## 2023-12-23 DIAGNOSIS — F331 Major depressive disorder, recurrent, moderate: Secondary | ICD-10-CM | POA: Diagnosis not present

## 2023-12-23 MED ORDER — HYDROXYZINE HCL 25 MG PO TABS
50.0000 mg | ORAL_TABLET | Freq: Every evening | ORAL | 1 refills | Status: DC | PRN
  Filled 2023-12-23 – 2024-02-24 (×2): qty 60, 30d supply, fill #0
  Filled 2024-04-02: qty 60, 30d supply, fill #1

## 2023-12-23 MED ORDER — FLUOXETINE HCL 20 MG PO CAPS
20.0000 mg | ORAL_CAPSULE | Freq: Every day | ORAL | 1 refills | Status: DC
  Filled 2023-12-23 – 2024-02-24 (×2): qty 60, 60d supply, fill #0
  Filled 2024-04-02: qty 30, 30d supply, fill #1
  Filled 2024-06-26: qty 60, 60d supply, fill #1

## 2023-12-23 NOTE — Progress Notes (Signed)
 [facility] MD/PA/NP OP Progress Note  12/23/2023 9:13 AM RAKEYA Moreno  MRN:  [id number]  Chief Complaint: Medication management   HPI: Kristina Moreno 58 year old African-American female presents for medication management follow-up appointment.  She denied concerns at this visit.  States she has been taking and tolerating medications well.  Denying any medication side effects.  Continues to endorse marijuana utilization daily.  Reports she has had a few panic attacks.  Discussed THC may be attributing to anxiety/panic attacks.  Patient was dismissive of education.  States she is doing well overall.  Reports working 20 hours weekly.  Reports a good appetite and states she is resting well throughout the night with medication.  Has plans to start exercising.  Attributes excessive weight gain to low motivation/laziness.  Major depressive disorder: Generalized anxiety disorder:  Continue Prozac 20 mg daily Continue hydroxyzine 50 mg p.o. nightly as needed  Kristina Moreno is awake alert and oriented x 3.  She presents with a bright and pleasant affect.  No concerns related to suicidal or homicidal ideations.  Does not appear to be responding to internal or external stimuli.  Mood and affect is congruent.  No concerns related to sleep disturbance.  Answered all questions appropriately.  Patient to follow-up 4 to 5 months.  States she has discontinued therapy services due to her mood improving.   Visit Diagnosis:    ICD-10-CM   1. Major depressive disorder, recurrent episode, moderate with anxious distress (HCC)  F33.1     2. Anxiety  F41.9       Past Psychiatric History: See chart  Past Medical History:  Past Medical History:  Diagnosis Date   Anxiety    Asthma    Borderline personality disorder (HCC)    Contact dermatitis    COPD (chronic obstructive pulmonary disease) (HCC)    Fibroid    Meniere disease    Sciatica     Past Surgical History:  Procedure Laterality Date   HEMORRHOID BANDING      TOTAL ABDOMINAL HYSTERECTOMY  2007   transvaginal hysterectomy  04/03/2006   Pathology revealed benign uterus and cervix    Family Psychiatric History:   Family History:  Family History  Problem Relation Age of Onset   Leukemia Father    COPD Mother    Colon polyps Paternal Grandfather    Anesthesia problems Neg Hx    Hypotension Neg Hx    Malignant hyperthermia Neg Hx    Pseudochol deficiency Neg Hx    Colon cancer Neg Hx    Esophageal cancer Neg Hx    Rectal cancer Neg Hx    Stomach cancer Neg Hx     Social History:  Social History   Socioeconomic History   Marital status: Single    Spouse name: Not on file   Number of children: 2   Years of education: Not on file   Highest education level: Not on file  Occupational History   Occupation: Conservation officer, nature  Tobacco Use   Smoking status: Former    Current packs/day: 0.00    Types: Cigarettes    Start date: 10/23/1975    Quit date: 12/14/2007    Years since quitting: 16.0   Smokeless tobacco: Never  Substance and Sexual Activity   Alcohol use: Yes    Comment: 2-3 beers on the weekends   Drug use: Yes    Types: Marijuana    Comment: gummies   Sexual activity: Yes    Birth control/protection: Surgical  Other Topics Concern  Not on file  Social History Narrative   Works at [employer] alone   Social Drivers of Health   Financial Resource Strain: Low Risk  (10/31/2022)   Overall Financial Resource Strain (CARDIA)    Difficulty of Paying Living Expenses: Not very hard  Food Insecurity: No Food Insecurity (10/31/2022)   Hunger Vital Sign    Worried About Running Out of Food in the Last Year: Never true    Ran Out of Food in the Last Year: Never true  Transportation Needs: Unmet Transportation Needs (10/31/2022)   PRAPARE - Administrator, Civil Service (Medical): Yes    Lack of Transportation (Non-Medical): No  Physical Activity: Inactive (10/31/2022)   Exercise Vital Sign    Days of Exercise per Week:  0 days    Minutes of Exercise per Session: 0 min  Stress: No Stress Concern Present (10/31/2022)   [employer] of Occupational Health - Occupational Stress Questionnaire    Feeling of Stress : Not at all  Social Connections: Moderately Isolated (10/31/2022)   Social Connection and Isolation Panel [NHANES]    Frequency of Communication with Friends and Family: More than three times a week    Frequency of Social Gatherings with Friends and Family: Twice a week    Attends Religious Services: More than 4 times per year    Active Member of Clubs or Organizations: No    Attends Banker Meetings: Never    Marital Status: Divorced    Allergies: No Known Allergies  Metabolic Disorder Labs: Lab Results  Component Value Date   HGBA1C 5.8 (A) 11/22/2022   No results found for: "PROLACTIN" Lab Results  Component Value Date   CHOL 193 08/29/2023   TRIG 138 08/29/2023   HDL 65 08/29/2023   CHOLHDL 3.0 08/29/2023   VLDL 22 05/24/2010   LDLCALC 104 (H) 08/29/2023   LDLCALC 139 (H) 04/16/2022   Lab Results  Component Value Date   TSH 1.720 06/11/2022    Therapeutic Level Labs: No results found for: "LITHIUM" No results found for: "VALPROATE" No results found for: "CBMZ"  Current Medications: Current Outpatient Medications  Medication Sig Dispense Refill   FLUoxetine (PROZAC) 20 MG capsule Take 1 capsule (20 mg total) by mouth daily. 60 capsule 1   hydrOXYzine (ATARAX) 25 MG tablet Take 2 tablets (50 mg total) by mouth at bedtime as needed for anxiety. 60 tablet 1   rosuvastatin (CRESTOR) 40 MG tablet Take 1 tablet (40 mg total) by mouth daily. 90 tablet 3   No current facility-administered medications for this visit.     Musculoskeletal: Strength & Muscle Tone: within normal limits Gait & Station: normal Patient leans: N/A  Psychiatric Specialty Exam: Review of Systems  Constitutional: Negative.   Respiratory: Negative.    Psychiatric/Behavioral:   Negative for decreased concentration and hallucinations. Sleep disturbance: iimproving.The patient is nervous/anxious.   All other systems reviewed and are negative.   There were no vitals taken for this visit.There is no height or weight on file to calculate BMI.  General Appearance: Casual  Eye Contact:  Good  Speech:  Clear and Coherent  Volume:  Normal  Mood:  Anxious and Depressed  Affect:  Congruent  Thought Process:  Coherent  Orientation:  Full (Time, Place, and Person)  Thought Content: Logical   Suicidal Thoughts:  No  Homicidal Thoughts:  No  Memory:  Immediate;   Good Recent;   Good  Judgement:  Good  Insight:  Good  Psychomotor Activity:  Normal  Concentration:  Concentration: Good  Recall:  Good  Fund of Knowledge: Good  Language: Good  Akathisia:  No  Handed:  Right  AIMS (if indicated): done  Assets:  Communication Skills Desire for Improvement Resilience Social Support  ADL's:  Intact  Cognition: WNL  Sleep:  Fair   Screenings: AUDIT    Advertising copywriter from 10/31/2022 in [facility]  Alcohol Use Disorder Identification Test Final Score (AUDIT) 3      GAD-7    Flowsheet Row Office Visit from 01/10/2023 in [facility] Counselor from 10/31/2022 in [facility] Counselor from 01/12/2021 in [facility] Office Visit from 10/05/2019 in [facility] Of [place]. [facility] Office Visit from 11/03/2018 in [facility] Of [place]. [facility]  Total GAD-7 Score 12 3 13 9 14       PHQ2-9    Flowsheet Row Office Visit from 09/18/2023 in [facility] Of [place]. [facility] Most recent reading at 09/18/2023  4:22 PM Office Visit from 09/16/2023 in [facility] Most recent reading at 09/16/2023  2:50 PM  Office Visit from 08/29/2023 in [facility] - [facility] Of [place]. [facility] Most recent reading at 08/29/2023  1:31 PM Office Visit from 01/10/2023 in [facility] Most recent reading at 01/10/2023  1:25 PM Office Visit from 01/10/2023 in [facility] - A Dept Of [place]. [facility] Most recent reading at 01/10/2023  8:52 AM  PHQ-2 Total Score 4 1 2 4 2   PHQ-9 Total Score 12 8 6 15 8       Flowsheet Row ED from 09/20/2023 in [facility] Emergency Department at [facility] Office Visit from 01/10/2023 in [facility] Counselor from 08/20/2022 in [facility]  C-SSRS RISK CATEGORY No Risk No Risk No Risk        Assessment and Plan: Kristina Moreno 58 year old African-American female. carries a diagnosis related to major depressive disorder and generalized anxiety disorder.  Reports her mood has stabilized does endorse low motivation and weight gain however she attributes this to marijuana intake. She continues to report marijuana use frequently.  Education provided with discontinuing THC use.  Patient was reluctant to plan. -Currently prescribed hydroxyzine and Prozac that she reports she has been taking and tolerating well.  States her sleep has improved since initiating hydroxyzine.   Major depressive disorder: Generalized anxiety disorder:  Continue Prozac 20 mg daily Continue hydroxyzine 50 mg p.o. nightly as needed Follow-up 4 to 5 months  Collaboration of Care: Collaboration of Care: Medication Management AEB continue Prozac/hydroxyzine  Patient/Guardian was advised Release of Information must be obtained prior to any record release in order to collaborate their care with an outside provider. Patient/Guardian was advised if they have not already done so to contact the registration department to sign all necessary forms in order for [place] to release  information regarding their care.   Consent: Patient/Guardian gives verbal consent for treatment and assignment of benefits for services provided during this visit. Patient/Guardian expressed understanding and agreed to proceed.    Oneta Rack, NP 12/23/2023, 9:13 AM

## 2023-12-23 NOTE — Addendum Note (Signed)
 Addended by: Oneta Rack on: 12/23/2023 09:56 AM   Modules accepted: Level of Service

## 2024-02-24 ENCOUNTER — Other Ambulatory Visit: Payer: Self-pay

## 2024-02-27 ENCOUNTER — Other Ambulatory Visit: Payer: Self-pay

## 2024-02-28 ENCOUNTER — Other Ambulatory Visit: Payer: Self-pay

## 2024-04-02 ENCOUNTER — Other Ambulatory Visit: Payer: Self-pay

## 2024-06-26 ENCOUNTER — Other Ambulatory Visit (HOSPITAL_COMMUNITY): Payer: Self-pay | Admitting: Family

## 2024-06-26 ENCOUNTER — Other Ambulatory Visit: Payer: Self-pay

## 2024-07-20 ENCOUNTER — Ambulatory Visit (INDEPENDENT_AMBULATORY_CARE_PROVIDER_SITE_OTHER)

## 2024-07-20 ENCOUNTER — Other Ambulatory Visit: Payer: Self-pay

## 2024-07-20 VITALS — BP 110/94 | HR 75 | Ht 65.0 in | Wt 169.4 lb

## 2024-07-20 DIAGNOSIS — Z0000 Encounter for general adult medical examination without abnormal findings: Secondary | ICD-10-CM

## 2024-07-20 DIAGNOSIS — E782 Mixed hyperlipidemia: Secondary | ICD-10-CM

## 2024-07-20 DIAGNOSIS — R7303 Prediabetes: Secondary | ICD-10-CM | POA: Diagnosis not present

## 2024-07-20 DIAGNOSIS — Z23 Encounter for immunization: Secondary | ICD-10-CM | POA: Diagnosis not present

## 2024-07-20 LAB — POCT GLYCOSYLATED HEMOGLOBIN (HGB A1C): Hemoglobin A1C: 5.7 % — AB (ref 4.0–5.6)

## 2024-07-20 MED ORDER — HYDROXYZINE HCL 25 MG PO TABS
50.0000 mg | ORAL_TABLET | Freq: Every evening | ORAL | 1 refills | Status: AC | PRN
  Filled 2024-07-20: qty 60, 30d supply, fill #0
  Filled 2024-10-05: qty 60, 30d supply, fill #1

## 2024-07-20 NOTE — Progress Notes (Signed)
 SUBJECTIVE:   Chief compliant/HPI: annual examination  Kristina Moreno is a 58 y.o. who presents today for an annual exam.   She works at [employer], working in produce. She eats a lot of fruit and chicken, balanced diet. Trying to eat more veggies.   She endorses smoking marijuana every day, she'll occasionally switch to gummies. She did completely quit once but restarted out of habit. Remote hx of cigarette smoking, crack smoking and heavier alcohol use, not for >20 years. These days she'll have 2-3 drinks every other weekend when she goes dancing.   Consistent condom use w/ two female partners, feels comfortable with her partners.  Concern for STDs, no abnormal bleeding or discharge.  She reports taking her Crestor , Prozac , Atarax  until it ran out.  She also takes fish oil, ginger, turmeric which she feels gives her significant benefit, specifically helping her with joint pain.  History tabs reviewed and updated.   OBJECTIVE:   BP (!) 110/94   Pulse 75   Ht 5' 5 (1.651 m)   Wt 169 lb 6.4 oz (76.8 kg)   SpO2 100%   BMI 28.19 kg/m   General: Awake, alert, NAD. Communicates clearly. HEENT: NCAT. PERRLA EOMI. Anicteric sclera, conjunctiva clear. TM intact w/o effusion, EAC patent. MMM, clear OP w/ no exudates or erythema.  Cardio: RRR. Normal S1, S2. No murmur, rub, gallop. 2+ radial and dorsalis pedis pulses b/l w/ good capillary refill. No LE edema.  Resp: CTA bilaterally. No wheezes, rales, or rhonchi. Normal work of breathing on room air.  Abdomen: soft, non-tender, non-distended. Normoactive BS auscultated. No guarding, rigidity, or rebound.  MSK: Full ROM w/ no TTP or obvious deformity in b/l UE and LE. No swelling or sign of injury.  Skin: No rash or lesions appreciated. No abnormal nevi.  Neuro: AOx4. Bilateral UE and LE strength 5/5. Normal gait, no focal deficits.  Psych: Appropriate mood and affect. No SI/HI.     ASSESSMENT/PLAN:   Assessment & Plan Encounter for  health maintenance examination in adult Routine physical examination Deferred Pap to PCP given patient's preference for female provider Hep B titer CBC Mixed hyperlipidemia Lipid panel today Patient on Crestor  40 mg daily, compliant LDL 104 in November 2024 Prediabetes A1c 5.7 today in the clinic, 5.8 a year ago Counseled patient on additional strategies for blood sugar control, including emphasis on vegetables and protein and moderation with high glycemic index fruits.   Annual Examination  See AVS for age appropriate recommendations  PHQ score 6, reviewed and discussed.  BP reviewed and at goal .  Asked about intimate partner violence and resources given as appropriate   Considered the following items based upon USPSTF recommendations: Diabetes screening: ordered HIV testing:discussed and declined Hepatitis C: discussed and declined Hepatitis B:ordered Syphilis if at high risk: discussed and declined GC/CT not at high risk and not ordered. Lipid panel (nonfasting or fasting) discussed based upon AHA recommendations and ordered.  Consider repeat every 4-6 years.  Reviewed risk factors for latent tuberculosis and not indicated Osteoporosis screening considered based upon risk of fracture from Kristina Moreno calculator. Major osteoporotic fracture risk is low. DEXA not ordered.   Cancer Screening Discussion  Cervical cancer screening: discussed and declined.  Breast cancer screening: ordered Lung cancer screening:not indicated as does not meet criteria.  See documentation below regarding indications/risks/benefits.  Colorectal cancer screening: discussed, colonoscopy ordered.  Vaccinations - Flu.   Follow up in 1 year or sooner if indicated.  Kristina Scriver, DO [facility]

## 2024-07-20 NOTE — Assessment & Plan Note (Signed)
 Lipid panel today Patient on Crestor  40 mg daily, compliant LDL 104 in November 2024

## 2024-07-20 NOTE — Assessment & Plan Note (Signed)
 A1c 5.7 today in the clinic, 5.8 a year ago Counseled patient on additional strategies for blood sugar control, including emphasis on vegetables and protein and moderation with high glycemic index fruits.

## 2024-07-21 ENCOUNTER — Ambulatory Visit: Payer: Self-pay

## 2024-07-21 ENCOUNTER — Telehealth: Payer: Self-pay

## 2024-07-21 LAB — LIPID PANEL
Chol/HDL Ratio: 2.8 ratio (ref 0.0–4.4)
Cholesterol, Total: 175 mg/dL (ref 100–199)
HDL: 63 mg/dL (ref >39)
LDL Chol Calc (NIH): 94 mg/dL (ref 0–99)
Triglycerides: 103 mg/dL (ref 0–149)
VLDL Cholesterol Cal: 18 mg/dL (ref 5–40)

## 2024-07-21 LAB — CBC WITH DIFFERENTIAL/PLATELET
Basophils Absolute: 0.1 x10E3/uL (ref 0.0–0.2)
Basos: 1 %
EOS (ABSOLUTE): 0.2 x10E3/uL (ref 0.0–0.4)
Eos: 4 %
Hematocrit: 45.2 % (ref 34.0–46.6)
Hemoglobin: 14.4 g/dL (ref 11.1–15.9)
Immature Grans (Abs): 0 x10E3/uL (ref 0.0–0.1)
Immature Granulocytes: 0 %
Lymphocytes Absolute: 2.2 x10E3/uL (ref 0.7–3.1)
Lymphs: 50 %
MCH: 26.9 pg (ref 26.6–33.0)
MCHC: 31.9 g/dL (ref 31.5–35.7)
MCV: 85 fL (ref 79–97)
Monocytes Absolute: 0.5 x10E3/uL (ref 0.1–0.9)
Monocytes: 10 %
Neutrophils Absolute: 1.6 x10E3/uL (ref 1.4–7.0)
Neutrophils: 35 %
Platelets: 204 x10E3/uL (ref 150–450)
RBC: 5.35 x10E6/uL — ABNORMAL HIGH (ref 3.77–5.28)
RDW: 14.4 % (ref 11.7–15.4)
WBC: 4.5 x10E3/uL (ref 3.4–10.8)

## 2024-07-21 LAB — BASIC METABOLIC PANEL WITH GFR
BUN/Creatinine Ratio: 13 (ref 9–23)
BUN: 12 mg/dL (ref 6–24)
CO2: 21 mmol/L (ref 20–29)
Calcium: 9.7 mg/dL (ref 8.7–10.2)
Chloride: 102 mmol/L (ref 96–106)
Creatinine, Ser: 0.91 mg/dL (ref 0.57–1.00)
Glucose: 82 mg/dL (ref 70–99)
Potassium: 4.4 mmol/L (ref 3.5–5.2)
Sodium: 141 mmol/L (ref 134–144)
eGFR: 73 mL/min/1.73 (ref >59)

## 2024-07-21 LAB — HEPATITIS B SURFACE ANTIBODY, QUANTITATIVE: Hepatitis B Surf Ab Quant: 8.7 m[IU]/mL — ABNORMAL LOW

## 2024-07-21 NOTE — Telephone Encounter (Signed)
 Printed letter per Dr. Merced request and placed it in the outgoing mail box up front.  Harlene Reiter, CMA

## 2024-07-23 ENCOUNTER — Other Ambulatory Visit: Payer: Self-pay

## 2024-07-24 NOTE — Telephone Encounter (Signed)
 Letter printed and mailed by Glade [place].  Cena JONELLE Pesa, CMA

## 2024-08-06 ENCOUNTER — Ambulatory Visit

## 2024-08-06 DIAGNOSIS — Z23 Encounter for immunization: Secondary | ICD-10-CM

## 2024-08-06 NOTE — Progress Notes (Signed)
 Patient presents to nurse clinic this morning for Hep B vaccine.  Vaccine given without complication.  See admin for details.

## 2024-08-20 ENCOUNTER — Ambulatory Visit

## 2024-10-05 ENCOUNTER — Other Ambulatory Visit (HOSPITAL_COMMUNITY): Payer: Self-pay | Admitting: Family

## 2024-10-05 ENCOUNTER — Other Ambulatory Visit: Payer: Self-pay

## 2024-10-08 ENCOUNTER — Other Ambulatory Visit: Payer: Self-pay

## 2024-10-09 ENCOUNTER — Ambulatory Visit: Payer: Self-pay

## 2024-10-09 ENCOUNTER — Other Ambulatory Visit: Payer: Self-pay

## 2024-10-09 VITALS — BP 110/70 | HR 74 | Ht 65.0 in | Wt 175.0 lb

## 2024-10-09 DIAGNOSIS — F419 Anxiety disorder, unspecified: Secondary | ICD-10-CM | POA: Diagnosis not present

## 2024-10-09 DIAGNOSIS — F339 Major depressive disorder, recurrent, unspecified: Secondary | ICD-10-CM | POA: Diagnosis present

## 2024-10-09 MED ORDER — ROSUVASTATIN CALCIUM 40 MG PO TABS
40.0000 mg | ORAL_TABLET | Freq: Every day | ORAL | 3 refills | Status: AC
  Filled 2024-10-09: qty 90, 90d supply, fill #0

## 2024-10-09 MED ORDER — FLUOXETINE HCL 20 MG PO CAPS
20.0000 mg | ORAL_CAPSULE | Freq: Every day | ORAL | 2 refills | Status: AC
  Filled 2024-10-09: qty 90, 90d supply, fill #0

## 2024-10-09 NOTE — Patient Instructions (Addendum)
 Thank you for visiting clinic today and allowing us  to participate in your care!  We refilled your Prozac  and Rosuvastatin  medications today.   Please use Hydroxyzine  25mg  only as needed at bedtime for anxiety. Avoid every day use.    Please schedule an appointment in 3-6 to follow up with your PCP.   Reach out any time with any questions or concerns you may have - we are here for you!  Damien Cassis, MD [facility] [phone]

## 2024-10-09 NOTE — Progress Notes (Unsigned)
" ° ° °  SUBJECTIVE:   CHIEF COMPLAINT / HPI:   Prozac  Mood overall doing well No SI/HI Using atarax  to help with sleep   PERTINENT  PMH / PSH: ***  OBJECTIVE:   BP 110/70   Pulse 74   Ht 5' 5 (1.651 m)   Wt 175 lb (79.4 kg)   SpO2 98%   BMI 29.12 kg/m   General: Well-appearing. Resting comfortably in room. CV: Normal S1/S2. No extra heart sounds. Warm and well-perfused. Pulm: Breathing comfortably on room air. CTAB. No increased WOB. Skin:  Warm, dry. Psych: Pleasant and appropriate.   Flowsheet Row Office Visit from 10/09/2024 in [facility] Dept Of [place]. [facility]  PHQ-9 Total Score 6    ASSESSMENT/PLAN:   Assessment & Plan      Damien Cassis, MD [facility]  "

## 2024-10-11 NOTE — Assessment & Plan Note (Signed)
 PHQ9 stable, no SI/HI. Mood well controlled on Prozac  20 daily. Refills provided. Discussed appropriate use of Atarax  prn for anxiety.

## 2024-10-12 ENCOUNTER — Ambulatory Visit: Payer: Self-pay

## 2024-10-26 ENCOUNTER — Encounter: Payer: Self-pay | Admitting: Gastroenterology

## 2024-10-27 ENCOUNTER — Ambulatory Visit: Admitting: Student

## 2024-10-27 ENCOUNTER — Encounter: Payer: Self-pay | Admitting: Student

## 2024-10-27 VITALS — BP 117/78 | HR 71 | Ht 65.0 in | Wt 178.1 lb

## 2024-10-27 DIAGNOSIS — M26609 Unspecified temporomandibular joint disorder, unspecified side: Secondary | ICD-10-CM

## 2024-10-27 DIAGNOSIS — S93491A Sprain of other ligament of right ankle, initial encounter: Secondary | ICD-10-CM | POA: Diagnosis not present

## 2024-10-27 NOTE — Progress Notes (Signed)
" ° ° °  SUBJECTIVE:   CHIEF COMPLAINT / HPI:   Otalgia/jaw pain Pain began approximately 1 week ago.  She was using a new mouthguard (OTC), noticed worsening pain.  Pain worse when clenching her jaw.  No tinnitus, reduced hearing, drainage from ear.  Denies trauma.  Pain also worsened by eating.  Ankle pain Patient reports that she rolled her ankle this morning.  Worked on it for 7 hours.  Reports pain on anterior lateral aspect of ankle.  OBJECTIVE:   BP 117/78   Pulse 71   Ht 5' 5 (1.651 m)   Wt 178 lb 2 oz (80.8 kg)   SpO2 100%   BMI 29.64 kg/m    General: NAD, pleasant, HEENT: Normocephalic, atraumatic head. Normal external ear, canal, TM bilaterally. EOM intact and normal conjunctiva BL. Normal external nose. Throat not erythematous, no exudate, no deviation.  Worn teeth consistent with bruxism, TTP over TMJ. Right ankle: No gross trauma, no ecchymosis, no swelling.  TTP over ATFL.  Bears weight.  Normal ligamental stress testing.  Pain significantly worsened with supination of foot.   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 - Supportive care measures discussed - Follow-up if symptoms worsen or fail to improve Sprain of anterior talofibular ligament of right ankle, initial encounter - Exam/history consistent with ankle sprain, ATFL - Supportive care measures discussed, RICE - Follow-up if symptoms worsen or fail to improve   Gladis Church, DO [facility] "

## 2024-10-27 NOTE — Patient Instructions (Addendum)
 It was great to see you! Thank you for allowing me to participate in your care!   I recommend that you always bring your medications to each appointment as this makes it easy to ensure we are on the correct medications and helps us  not miss when refills are needed.  Our plans for today:  - I recommend eating soft foods, taking Tylenol/ibuprofen  until your jaw feels better - Additionally, recommend going to the dentist/orthodontist to have mouthguard's fitted given your teeth grinding/jaw clenching at night - For your ankle, I recommend rest/elevation/ice and Ace bandage compression -Follow-up if symptoms worsen or fail to improve  Take care and seek immediate care sooner if you develop any concerns. Please remember to show up 15 minutes before your scheduled appointment time!  Gladis Church, DO [facility]

## 2024-12-07 ENCOUNTER — Ambulatory Visit: Admitting: Gastroenterology
